# Patient Record
Sex: Female | Born: 1935 | Race: White | Hispanic: No | Smoking: Never smoker
Health system: Southern US, Community
[De-identification: ages and names within clinical notes are randomized; demographics above are authoritative.]

## PROBLEM LIST (undated history)

## (undated) DIAGNOSIS — E7800 Pure hypercholesterolemia, unspecified: Secondary | ICD-10-CM

## (undated) DIAGNOSIS — G459 Transient cerebral ischemic attack, unspecified: Secondary | ICD-10-CM

## (undated) DIAGNOSIS — F419 Anxiety disorder, unspecified: Secondary | ICD-10-CM

## (undated) DIAGNOSIS — D128 Benign neoplasm of rectum: Secondary | ICD-10-CM

## (undated) DIAGNOSIS — I10 Essential (primary) hypertension: Secondary | ICD-10-CM

## (undated) DIAGNOSIS — D125 Benign neoplasm of sigmoid colon: Secondary | ICD-10-CM

## (undated) DIAGNOSIS — D649 Anemia, unspecified: Secondary | ICD-10-CM

## (undated) HISTORY — DX: Benign neoplasm of rectum: D12.8

## (undated) HISTORY — PX: ABDOMINAL HYSTERECTOMY: SHX81

## (undated) HISTORY — DX: Anemia, unspecified: D64.9

## (undated) HISTORY — DX: Essential (primary) hypertension: I10

## (undated) HISTORY — PX: VESICOVAGINAL FISTULA CLOSURE W/ TAH: SUR271

## (undated) HISTORY — PX: TONSILLECTOMY: SUR1361

## (undated) HISTORY — DX: Benign neoplasm of sigmoid colon: D12.5

## (undated) HISTORY — DX: Transient cerebral ischemic attack, unspecified: G45.9

## (undated) HISTORY — DX: Anxiety disorder, unspecified: F41.9

---

## 2013-06-06 ENCOUNTER — Ambulatory Visit (INDEPENDENT_AMBULATORY_CARE_PROVIDER_SITE_OTHER): Payer: Medicare Other | Admitting: Neurology

## 2013-06-06 ENCOUNTER — Encounter: Payer: Self-pay | Admitting: Neurology

## 2013-06-06 VITALS — BP 150/78 | HR 68 | Temp 98.2°F | Resp 20 | Ht 63.0 in | Wt 146.7 lb

## 2013-06-06 DIAGNOSIS — R4182 Altered mental status, unspecified: Secondary | ICD-10-CM

## 2013-06-06 NOTE — Patient Instructions (Addendum)
[facility] [place] main entrance A  Ask for EEG lab  Friday Jan 23 at 1045 @[facility] (Error - No data available.)@

## 2013-06-06 NOTE — Progress Notes (Addendum)
NEUROLOGY CONSULTATION NOTE  Sherry Hess MRN: [id number] DOB: [birth year]  Referring provider: Dr. Lin Landsman Primary care provider: Dr. Lin Landsman  Reason for consult:  TIA  HISTORY OF PRESENT ILLNESS: Sherry Hess is an 80 year old right-handed woman with hypertension and hyperlipidemia who presents for TIA.  [name] accompanied by her son.  Records and images were personally reviewed where available.    On 03/31/13, she went outside to the woods.  She was sitting on the bench and when she stood up, she suddenly felt a sensation of extreme fatigue.  There was no lightheadedness, vertigo, palpitations, diaphoresis, epigastric rising, or strange smell.  Her sons came and found her leaning forward on the bench.  She had a blank stare and was staring straight across.  Her son asked her if she was okay, but she could only mumble "uh huh."  She was asked to smile and was able to minimally smile.  No facial droop was noted.  She did not have any unilateral weakness of the arms or legs.  There was no focal twitching.  Symptoms lasted a few minutes.  As per her son, she didn't remember the incident at first, although she says that she does now.  She did go to the ED.  CT of head did not reveal an acute stroke or bleed.  At the time, she had been following a low-fat, low-cholesterol diet and had been monitoring her blood pressure with systolic BP readings between 120-139.  She did not have any changes to medications at this time.  She was taking ASA 96m which was switched to 3248m  04/23/13 MRI BRAIN WO:  small vessel ischemic changes and atrophy but no acute infarct. 04/23/13 CAROTID DUPLEX:  <50% bilateral ICA stenosis. 04/02/13 LABS:  ESR 1, RPR nonreactive, ANA negative She reportedly had an echocardiogram but I do not have those results.  Apparently it was okay.  ECGs were okay.  She does follow with a cardiologist. She has no history of TIA, stroke or seizure.  She does report a fainting spell  which sounds like a vasovagal reaction.  She was sitting on a barstool eating chips when it got caught in her throat and she passed out.  Medications include:  ASA 8143mFish Oil, carvedilol, Lisinopril, micardis, lovastatin 42m35mertraline.  PAST MEDICAL HISTORY: Hypertension Hypercholesterolemia Anxiety Macular degeneration  PAST SURGICAL HISTORY: Hysterectomy  MEDICATIONS: Lisinopril Doxazosin Omeprazole Micardis ASA 325mg38mLERGIES: Allergies  Allergen Reactions  . Clonidine Derivatives Other (See Comments)    [name]  . Minoxidil     rash  . Norvasc [Amlodipine Besylate]     rash    FAMILY HISTORY: No family history on file.  SOCIAL HISTORY: History   Social History  . Marital Status: Widowed    Spouse Name: N/A    Number of Children: N/A  . Years of Education: N/A   Occupational History  . Not on file.   Social History Main Topics  . Smoking status: Never Smoker   . Smokeless tobacco: Not on file  . Alcohol Use: No  . Drug Use: No  . Sexual Activity: Not on file   Other Topics Concern  . Not on file   Social History Narrative  . No narrative on file    REVIEW OF SYSTEMS: Constitutional: No fevers, chills, or sweats, no generalized fatigue, change in appetite Eyes: No visual changes, double vision, eye pain Ear, nose and throat: No hearing loss, ear pain, nasal congestion,  sore throat Cardiovascular: No chest pain, palpitations Respiratory:  No shortness of breath at rest or with exertion, wheezes GastrointestinaI: No nausea, vomiting, diarrhea, abdominal pain, fecal incontinence Genitourinary:  No dysuria, urinary retention or frequency Musculoskeletal:  No neck pain, back pain Integumentary: No rash, pruritus, skin lesions Neurological: as above Psychiatric: No depression, insomnia, anxiety Endocrine: No palpitations, fatigue, diaphoresis, mood swings, change in appetite, change in weight, increased  thirst Hematologic/Lymphatic:  No anemia, purpura, petechiae. Allergic/Immunologic: no itchy/runny eyes, nasal congestion, recent allergic reactions, rashes  PHYSICAL EXAM: Filed Vitals:   06/06/13 0929  BP: 150/78  Pulse: 68  Temp: 98.2 F (36.8 C)  Resp: 20   General: No acute distress Head:  Normocephalic/atraumatic Neck: supple, no paraspinal tenderness, full range of motion Back: No paraspinal tenderness Heart: regular rate and rhythm Lungs: Clear to auscultation bilaterally. Vascular: No carotid bruits. Neurological Exam: Mental status: alert and oriented to person, place, and time, speech fluent and not dysarthric, language intact. Cranial nerves: CN I: not tested CN II: pupils equal, round and reactive to light, visual fields intact, fundi unremarkable. CN III, IV, VI:  full range of motion, no nystagmus, no ptosis CN V: facial sensation intact CN VII: upper and lower face symmetric CN VIII: hearing intact CN IX, X: gag intact, uvula midline CN XI: sternocleidomastoid and trapezius muscles intact CN XII: tongue midline Bulk & Tone: normal, no fasciculations. Motor: 5/5 throughout Sensation: temperature and vibration intact. Deep Tendon Reflexes: 2+ and symmetric in upper extremities, 3+ and symmetric in lower extremities, no clonus, toes down Finger to nose testing: normal Heel to shin: normal Gait: normal stance and stride.  Able to walk in tandem. Romberg negative.  IMPRESSION: Episode of altered mental status.  Symptoms are vague and unclear.  Certainly could have been TIA.  Should rule out increased seizure tendency as well.  Symptoms do not sound orthostatic.  PLAN: 1.  May switch back to ASA 29m.  Studies have not shown full-strength to be more effective in secondary stroke prevention (only increased bleeding risk).  May consider switching to Plavix although this too is a lateral change with increase risk for bleeding.  Patient wishes to remain on ASA 841m  which I have no objection. 2.  LDL goal should be less than 100.  Check Hgb A1c if not already checked (she says both have been checked by her PCP) 3.  Will check EEG 4.  Follow up in 3 months (sooner if EEG is suspicious for seizure tendency).  Thank you for allowing me to take part in the care of this patient.  AdMetta ClinesDO  CC:  JoLovette ClicheMD

## 2013-06-08 ENCOUNTER — Ambulatory Visit (HOSPITAL_COMMUNITY)
Admission: RE | Admit: 2013-06-08 | Discharge: 2013-06-08 | Disposition: A | Discharge status: Home / Self Care | Payer: Medicare Other | Source: Ambulatory Visit | Attending: Neurology | Admitting: Neurology

## 2013-06-08 DIAGNOSIS — R4182 Altered mental status, unspecified: Secondary | ICD-10-CM

## 2013-06-08 NOTE — Progress Notes (Signed)
EEG completed; results pending.    

## 2013-06-11 ENCOUNTER — Telehealth: Payer: Self-pay | Admitting: Neurology

## 2013-06-11 NOTE — Telephone Encounter (Signed)
Message copied by Annamaria Helling on Mon Jun 11, 2013 10:46 AM ------      Message from: JAFFE, ADAM R      Created: Mon Jun 11, 2013 10:26 AM       Luvenia Starch, please let Ms. Sleeper know that her EEG is normal.  Also, could be fax the report over to her PCP, Dr. Lovette Cliche?      Thanks.      ----- Message -----         From: Dudley Major, DO         Sent: 06/11/2013  10:26 AM           To: Dudley Major, DO                   ------

## 2013-06-11 NOTE — Telephone Encounter (Signed)
Patient made aware EEG normal. Copy of report sent to Dr Lin Landsman.

## 2013-06-11 NOTE — Procedures (Signed)
ELECTROENCEPHALOGRAM REPORT  Date of Study: 06/08/2013  Patient's Name: Sherry Hess MRN: [id number] Date of Birth: 02-21-1931  Indication: Altered mental status   Medications: Lisinopril, doxazosin, omeprazole, Micardis, ASA  Technical Summary: This is a multichannel digital EEG recording, using the international 10-20 placement system.  Spike detection software was employed.  Description: The EEG background is symmetric, with a well-developed posterior dominant rhythm of 8-9 Hz, which is reactive to eye opening and closing.  Diffuse beta activity is seen, with a bilateral frontal preponderance.  No focal or generalized abnormalities are seen.  No focal or generalized epileptiform discharges are seen.  Stage II sleep is not seen.  Hyperventilation and photic stimulation were performed, and produced no abnormalities.  ECG revealed normal cardiac rate and rhythm.  Impression: This is a normal routine EEG of the awake and drowsy states, with activating procedures.  A normal study does not rule out the possibility of a seizure disorder in this patient.  Adam R. Tomi Likens, DO

## 2013-09-10 ENCOUNTER — Encounter: Payer: Self-pay | Admitting: Neurology

## 2013-09-10 ENCOUNTER — Ambulatory Visit (INDEPENDENT_AMBULATORY_CARE_PROVIDER_SITE_OTHER): Payer: Medicare Other | Admitting: Neurology

## 2013-09-10 VITALS — BP 142/76 | HR 70 | Temp 98.0°F | Resp 18 | Ht 63.0 in | Wt 149.1 lb

## 2013-09-10 DIAGNOSIS — R404 Transient alteration of awareness: Secondary | ICD-10-CM

## 2013-09-10 NOTE — Patient Instructions (Signed)
Everything looks fine.  I would continue the baby aspirin 81mg  daily.  Follow up as needed.

## 2013-09-10 NOTE — Progress Notes (Signed)
NEUROLOGY FOLLOW UP OFFICE NOTE  Sherry Hess [id number]  HISTORY OF PRESENT ILLNESS: Sherry Hess is an 80 year old right-handed woman with hypertension and hyperlipidemia who follows up for episode of altered mental status.  [name] accompanied by her son.  Records and images were personally reviewed where available.    UPDATE: 06/11/13 EEG: normal.  No new complaints.  Feeling well.  HISTORY: On 03/31/13, she went outside to the woods.  She was sitting on the bench and when she stood up, she suddenly felt a sensation of extreme fatigue.  There was no lightheadedness, vertigo, palpitations, diaphoresis, epigastric rising, or strange smell.  Her sons came and found her leaning forward on the bench.  She had a blank stare and was staring straight across.  Her son asked her if she was okay, but she could only mumble "uh huh."  She was asked to smile and was able to minimally smile.  No facial droop was noted.  She did not have any unilateral weakness of the arms or legs.  There was no focal twitching.  Symptoms lasted a few minutes.  As per her son, she didn't remember the incident at first, although she says that she does now.  She did go to the ED.  CT of head did not reveal an acute stroke or bleed.  At the time, she had been following a low-fat, low-cholesterol diet and had been monitoring her blood pressure with systolic BP readings between 120-139.  She did not have any changes to medications at this time.  She was taking ASA 39m which was switched to 3217m  04/23/13 MRI BRAIN WO:  small vessel ischemic changes and atrophy but no acute infarct. 04/23/13 CAROTID DUPLEX:  <50% bilateral ICA stenosis. 04/02/13 LABS:  ESR 1, RPR nonreactive, ANA negative She reportedly had an echocardiogram but I do not have those results.  Apparently it was okay.  ECGs were okay.  She does follow with a cardiologist. She has no history of TIA, stroke or seizure.  She does report a fainting spell which  sounds like a vasovagal reaction.  She was sitting on a barstool eating chips when it got caught in her throat and she passed out.  Medications include:  ASA 8153mFish Oil, carvedilol, Lisinopril, micardis, lovastatin 43m5mertraline.  PAST MEDICAL HISTORY: Past Medical History  Diagnosis Date  . Hypertension     MEDICATIONS: Current Outpatient Prescriptions on File Prior to Visit  Medication Sig Dispense Refill  . doxazosin (CARDURA) 8 MG tablet Take 8 mg by mouth daily.      . Fish Oil-Cholecalciferol (FISH OIL + D3) 1000-1000 MG-UNIT CAPS Take by mouth.      . liMarland Kitcheninopril (PRINIVIL,ZESTRIL) 40 MG tablet Take 40 mg by mouth daily.      . loMarland Kitchenastatin (MEVACOR) 40 MG tablet Take 40 mg by mouth 2 (two) times daily.      . Multiple Minerals-Vitamins (CALCIUM & VIT D3 BONE HEALTH PO) Take by mouth.      . omMarland Kitchenprazole (PRILOSEC) 10 MG capsule Take 10 mg by mouth daily.      . pseudoephedrine-acetaminophen (TYLENOL SINUS) 30-500 MG TABS Take 1 tablet by mouth daily.      . teMarland Kitchenmisartan (MICARDIS) 40 MG tablet Take 40 mg by mouth daily.       No current facility-administered medications on file prior to visit.    ALLERGIES: Allergies  Allergen Reactions  . Clonidine Derivatives Other (See Comments)    Patients  feels drunk  . Minoxidil     rash  . Norvasc [Amlodipine Besylate]     rash    FAMILY HISTORY: Family History  Problem Relation Age of Onset  . Ataxia Neg Hx   . Chorea Neg Hx   . Dementia Neg Hx   . Mental retardation Neg Hx   . Migraines Neg Hx   . Multiple sclerosis Neg Hx   . Neurofibromatosis Neg Hx   . Neuropathy Neg Hx   . Parkinsonism Neg Hx   . Seizures Neg Hx   . Cancer Father     lung  . Stroke Mother   . Cancer Brother     x2 bladder    SOCIAL HISTORY: History   Social History  . Marital Status: Widowed    Spouse Name: N/A    Number of Children: N/A  . Years of Education: N/A   Occupational History  . Not on file.   Social History Main  Topics  . Smoking status: Never Smoker   . Smokeless tobacco: Not on file  . Alcohol Use: No  . Drug Use: No  . Sexual Activity: Not on file   Other Topics Concern  . Not on file   Social History Narrative  . No narrative on file    REVIEW OF SYSTEMS: Constitutional: No fevers, chills, or sweats, no generalized fatigue, change in appetite Eyes: No visual changes, double vision, eye pain Ear, nose and throat: No hearing loss, ear pain, nasal congestion, sore throat Cardiovascular: No chest pain, palpitations Respiratory:  No shortness of breath at rest or with exertion, wheezes GastrointestinaI: No nausea, vomiting, diarrhea, abdominal pain, fecal incontinence Genitourinary:  No dysuria, urinary retention or frequency Musculoskeletal:  No neck pain, back pain Integumentary: No rash, pruritus, skin lesions Neurological: as above Psychiatric: No depression, insomnia, anxiety Endocrine: No palpitations, fatigue, diaphoresis, mood swings, change in appetite, change in weight, increased thirst Hematologic/Lymphatic:  No anemia, purpura, petechiae. Allergic/Immunologic: no itchy/runny eyes, nasal congestion, recent allergic reactions, rashes  PHYSICAL EXAM: Filed Vitals:   09/10/13 0857  BP: 142/76  Pulse: 70  Temp: 98 F (36.7 C)  Resp: 18   General: No acute distress Head:  Normocephalic/atraumatic Neck: supple, no paraspinal tenderness, full range of motion Heart:  Regular rate and rhythm Lungs:  Clear to auscultation bilaterally Back: No paraspinal tenderness Neurological Exam: alert and oriented to person, place, and time. Attention span and concentration intact, recent and remote memory intact, fund of knowledge intact.  Speech fluent and not dysarthric, language intact.  CN II-XII intact. Fundoscopic exam unremarkable without vessel changes, exudates, hemorrhages or papilledema.  Bulk and tone normal, muscle strength 5/5 throughout.  Sensation to light touch intact.  Deep  tendon reflexes 2+ throughout.  Finger to nose and heel to shin testing intact.  Gait normal, Romberg negative.  IMPRESSION: Episode of altered mental status.  Possibly TIA but symptoms were vague and non-focal.  Did not seem like seizure.  No repeat spells.  PLAN: 1.  Continue ASA 70m daily 2.  Follow up as needed.  15 minutes spent with patient, over 50% spent counseling and coordinating care.  AMetta Clines DO  CC: JLovette Cliche MD

## 2014-11-20 DIAGNOSIS — I10 Essential (primary) hypertension: Secondary | ICD-10-CM | POA: Insufficient documentation

## 2014-11-20 HISTORY — DX: Essential (primary) hypertension: I10

## 2015-08-15 DIAGNOSIS — J329 Chronic sinusitis, unspecified: Secondary | ICD-10-CM | POA: Diagnosis not present

## 2015-08-15 DIAGNOSIS — Z0000 Encounter for general adult medical examination without abnormal findings: Secondary | ICD-10-CM | POA: Diagnosis not present

## 2015-08-15 DIAGNOSIS — Z1389 Encounter for screening for other disorder: Secondary | ICD-10-CM | POA: Diagnosis not present

## 2015-08-21 DIAGNOSIS — H26493 Other secondary cataract, bilateral: Secondary | ICD-10-CM | POA: Diagnosis not present

## 2015-08-21 DIAGNOSIS — Z961 Presence of intraocular lens: Secondary | ICD-10-CM | POA: Diagnosis not present

## 2015-08-21 DIAGNOSIS — H353112 Nonexudative age-related macular degeneration, right eye, intermediate dry stage: Secondary | ICD-10-CM | POA: Diagnosis not present

## 2015-12-27 DIAGNOSIS — R079 Chest pain, unspecified: Secondary | ICD-10-CM

## 2015-12-27 HISTORY — DX: Chest pain, unspecified: R07.9

## 2015-12-29 DIAGNOSIS — I10 Essential (primary) hypertension: Secondary | ICD-10-CM | POA: Diagnosis not present

## 2015-12-29 DIAGNOSIS — E785 Hyperlipidemia, unspecified: Secondary | ICD-10-CM | POA: Diagnosis not present

## 2015-12-29 DIAGNOSIS — I700 Atherosclerosis of aorta: Secondary | ICD-10-CM | POA: Diagnosis not present

## 2015-12-29 DIAGNOSIS — R079 Chest pain, unspecified: Secondary | ICD-10-CM | POA: Diagnosis not present

## 2016-02-24 DIAGNOSIS — H5202 Hypermetropia, left eye: Secondary | ICD-10-CM | POA: Diagnosis not present

## 2016-02-24 DIAGNOSIS — Z961 Presence of intraocular lens: Secondary | ICD-10-CM | POA: Diagnosis not present

## 2016-02-24 DIAGNOSIS — H353112 Nonexudative age-related macular degeneration, right eye, intermediate dry stage: Secondary | ICD-10-CM | POA: Diagnosis not present

## 2016-02-26 DIAGNOSIS — I10 Essential (primary) hypertension: Secondary | ICD-10-CM | POA: Diagnosis not present

## 2016-03-15 DIAGNOSIS — E559 Vitamin D deficiency, unspecified: Secondary | ICD-10-CM | POA: Diagnosis not present

## 2016-03-15 DIAGNOSIS — G459 Transient cerebral ischemic attack, unspecified: Secondary | ICD-10-CM | POA: Diagnosis not present

## 2016-03-15 DIAGNOSIS — Z79899 Other long term (current) drug therapy: Secondary | ICD-10-CM | POA: Diagnosis not present

## 2016-03-15 DIAGNOSIS — Z9181 History of falling: Secondary | ICD-10-CM | POA: Diagnosis not present

## 2016-03-15 DIAGNOSIS — Z01419 Encounter for gynecological examination (general) (routine) without abnormal findings: Secondary | ICD-10-CM | POA: Diagnosis not present

## 2016-03-15 DIAGNOSIS — D509 Iron deficiency anemia, unspecified: Secondary | ICD-10-CM | POA: Diagnosis not present

## 2016-03-24 DIAGNOSIS — R899 Unspecified abnormal finding in specimens from other organs, systems and tissues: Secondary | ICD-10-CM | POA: Diagnosis not present

## 2016-04-06 DIAGNOSIS — Z1231 Encounter for screening mammogram for malignant neoplasm of breast: Secondary | ICD-10-CM | POA: Diagnosis not present

## 2016-04-15 DIAGNOSIS — E871 Hypo-osmolality and hyponatremia: Secondary | ICD-10-CM | POA: Diagnosis not present

## 2016-04-15 DIAGNOSIS — N183 Chronic kidney disease, stage 3 (moderate): Secondary | ICD-10-CM | POA: Diagnosis not present

## 2016-04-15 DIAGNOSIS — Z8673 Personal history of transient ischemic attack (TIA), and cerebral infarction without residual deficits: Secondary | ICD-10-CM | POA: Diagnosis not present

## 2016-04-15 DIAGNOSIS — I129 Hypertensive chronic kidney disease with stage 1 through stage 4 chronic kidney disease, or unspecified chronic kidney disease: Secondary | ICD-10-CM | POA: Diagnosis not present

## 2016-04-28 DIAGNOSIS — M545 Low back pain: Secondary | ICD-10-CM | POA: Diagnosis not present

## 2016-05-03 DIAGNOSIS — M8580 Other specified disorders of bone density and structure, unspecified site: Secondary | ICD-10-CM | POA: Diagnosis not present

## 2016-05-03 DIAGNOSIS — N200 Calculus of kidney: Secondary | ICD-10-CM | POA: Diagnosis not present

## 2016-05-03 DIAGNOSIS — M545 Low back pain: Secondary | ICD-10-CM | POA: Diagnosis not present

## 2016-05-03 DIAGNOSIS — S22080A Wedge compression fracture of T11-T12 vertebra, initial encounter for closed fracture: Secondary | ICD-10-CM | POA: Diagnosis not present

## 2016-05-13 DIAGNOSIS — M545 Low back pain: Secondary | ICD-10-CM | POA: Diagnosis not present

## 2016-05-21 DIAGNOSIS — M545 Low back pain: Secondary | ICD-10-CM | POA: Diagnosis not present

## 2016-05-24 DIAGNOSIS — M545 Low back pain: Secondary | ICD-10-CM | POA: Diagnosis not present

## 2016-05-26 DIAGNOSIS — M545 Low back pain: Secondary | ICD-10-CM | POA: Diagnosis not present

## 2016-06-10 DIAGNOSIS — E860 Dehydration: Secondary | ICD-10-CM | POA: Diagnosis not present

## 2016-06-10 DIAGNOSIS — G459 Transient cerebral ischemic attack, unspecified: Secondary | ICD-10-CM | POA: Diagnosis not present

## 2016-06-10 DIAGNOSIS — R200 Anesthesia of skin: Secondary | ICD-10-CM | POA: Diagnosis not present

## 2016-06-14 DIAGNOSIS — G459 Transient cerebral ischemic attack, unspecified: Secondary | ICD-10-CM | POA: Diagnosis not present

## 2016-06-14 DIAGNOSIS — I10 Essential (primary) hypertension: Secondary | ICD-10-CM | POA: Diagnosis not present

## 2016-07-15 DIAGNOSIS — G459 Transient cerebral ischemic attack, unspecified: Secondary | ICD-10-CM | POA: Diagnosis not present

## 2016-07-15 DIAGNOSIS — I10 Essential (primary) hypertension: Secondary | ICD-10-CM | POA: Diagnosis not present

## 2016-07-16 DIAGNOSIS — M19071 Primary osteoarthritis, right ankle and foot: Secondary | ICD-10-CM | POA: Diagnosis not present

## 2016-07-16 DIAGNOSIS — Z7689 Persons encountering health services in other specified circumstances: Secondary | ICD-10-CM | POA: Diagnosis not present

## 2016-07-16 DIAGNOSIS — M19072 Primary osteoarthritis, left ankle and foot: Secondary | ICD-10-CM | POA: Diagnosis not present

## 2016-07-16 DIAGNOSIS — I10 Essential (primary) hypertension: Secondary | ICD-10-CM | POA: Diagnosis not present

## 2016-07-16 DIAGNOSIS — Z79899 Other long term (current) drug therapy: Secondary | ICD-10-CM | POA: Diagnosis not present

## 2016-08-17 ENCOUNTER — Ambulatory Visit (INDEPENDENT_AMBULATORY_CARE_PROVIDER_SITE_OTHER): Payer: PPO | Admitting: Neurology

## 2016-08-17 ENCOUNTER — Encounter: Payer: Self-pay | Admitting: Neurology

## 2016-08-17 VITALS — BP 142/76 | HR 57 | Ht 63.0 in | Wt 146.5 lb

## 2016-08-17 DIAGNOSIS — E785 Hyperlipidemia, unspecified: Secondary | ICD-10-CM

## 2016-08-17 DIAGNOSIS — I10 Essential (primary) hypertension: Secondary | ICD-10-CM

## 2016-08-17 DIAGNOSIS — G459 Transient cerebral ischemic attack, unspecified: Secondary | ICD-10-CM | POA: Diagnosis not present

## 2016-08-17 NOTE — Progress Notes (Signed)
NEUROLOGY FOLLOW UP OFFICE NOTE  Sherry Hess [id number]  HISTORY OF PRESENT ILLNESS: Sherry Hess is an 83 year old right-handed woman with hypertension and hyperlipidemia whom I previously saw in 2015 for episode of altered mental status presents today for TIA.  [name] accompanied by her son, who supplements history.    On 06/11/16, she developed sudden onset of numbness in the fingers of her left hand, lasting a couple of minutes and then followed by numbness along the left side of her lower face and jaw-line. In total, symptoms lasted 5 minutes and then resolved.  There was no associated facial droop, weakness, slurred speech, visual disturbance, dizziness, syncope or ataxia.  At the time, her systolic blood pressure was between 160 and 200.  She presented to the ED at [facility].  Notes are not available but she reportedly had a CT of the head which was negative for acute findings, such as stroke.  She followed up with her cardiologist, Dr. Bettina Gavia, who added Plavix to her ASA regimen and hydralazine to her blood pressure regimen of Lisinopril, Coreg and Micardis.  She has since been feeling well with no recurrent events.  PAST MEDICAL HISTORY: Past Medical History:  Diagnosis Date  . Hypertension     MEDICATIONS: Current Outpatient Prescriptions on File Prior to Visit  Medication Sig Dispense Refill  . Fish Oil-Cholecalciferol (FISH OIL + D3) 1000-1000 MG-UNIT CAPS Take by mouth.    . lovastatin (MEVACOR) 40 MG tablet Take 40 mg by mouth 2 (two) times daily.    . Multiple Minerals-Vitamins (CALCIUM & VIT D3 BONE HEALTH PO) Take by mouth.    Marland Kitchen omeprazole (PRILOSEC) 10 MG capsule Take 10 mg by mouth daily.    . pseudoephedrine-acetaminophen (TYLENOL SINUS) 30-500 MG TABS Take 1 tablet by mouth daily.     No current facility-administered medications on file prior to visit.     ALLERGIES: Allergies  Allergen Reactions  . Clonidine Derivatives Other (See Comments)   [name]  . Minoxidil     rash  . Norvasc [Amlodipine Besylate]     rash    FAMILY HISTORY: Family History  Problem Relation Age of Onset  . Ataxia Neg Hx   . Chorea Neg Hx   . Dementia Neg Hx   . Mental retardation Neg Hx   . Migraines Neg Hx   . Multiple sclerosis Neg Hx   . Neurofibromatosis Neg Hx   . Neuropathy Neg Hx   . Parkinsonism Neg Hx   . Seizures Neg Hx   . Cancer Father     lung  . Stroke Mother   . Cancer Brother     x2 bladder    SOCIAL HISTORY: Social History   Social History  . Marital status: Widowed    Spouse name: N/A  . Number of children: N/A  . Years of education: N/A   Occupational History  . Not on file.   Social History Main Topics  . Smoking status: Never Smoker  . Smokeless tobacco: Not on file  . Alcohol use No  . Drug use: No  . Sexual activity: Not on file   Other Topics Concern  . Not on file   Social History Narrative  . No narrative on file    REVIEW OF SYSTEMS: Constitutional: No fevers, chills, or sweats, no generalized fatigue, change in appetite Eyes: No visual changes, double vision, eye pain Ear, nose and throat: No hearing loss, ear pain, nasal congestion,  sore throat Cardiovascular: No chest pain, palpitations Respiratory:  No shortness of breath at rest or with exertion, wheezes GastrointestinaI: No nausea, vomiting, diarrhea, abdominal pain, fecal incontinence Genitourinary:  No dysuria, urinary retention or frequency Musculoskeletal:  No neck pain, back pain Integumentary: No rash, pruritus, skin lesions Neurological: as above Psychiatric: No depression, insomnia, anxiety Endocrine: No palpitations, fatigue, diaphoresis, mood swings, change in appetite, change in weight, increased thirst Hematologic/Lymphatic:  No purpura, petechiae. Allergic/Immunologic: no itchy/runny eyes, nasal congestion, recent allergic reactions, rashes  PHYSICAL EXAM: Vitals:   08/17/16 1502  BP: (!) 142/76    Pulse: (!) 57   General: No acute distress.  Patient appears well-groomed.  normal body habitus. Head:  Normocephalic/atraumatic Eyes:  Fundi examined but not visualized Neck: supple, no paraspinal tenderness, full range of motion Heart:  Regular rate and rhythm Lungs:  Clear to auscultation bilaterally Back: No paraspinal tenderness Neurological Exam: alert and oriented to person, place, and time. Attention span and concentration intact, recent and remote memory intact, fund of knowledge intact.  Speech fluent and not dysarthric, language intact.  CN II-XII intact. Bulk and tone normal, muscle strength 5/5 throughout.  Sensation to light touch, temperature and vibration intact.  Deep tendon reflexes 2+ in upper extremities and 3+ in lower extremities (as noted on prior exam in 2015), toes downgoing.  Finger to nose and heel to shin testing intact.  Gait normal, Romberg negative.  IMPRESSION: Transient ischemic attack Hypertension Hyperlipidemia  PLAN: 1.  Continue dual antiplatelet therapy.  In one month, advised to discontinue ASA and continue Plavix for secondary stroke/TIA prevention. 2.  Continue statin therapy as per PCP.  She has upcoming labs, including lipid panel.  LDL goal should be less than 70. 3.  Check carotid doppler. 4.  Optimize blood pressure control and recheck with PCP or cardiologist 5.  Follow up in 6 months.  Metta Clines, DO  CC:  Ernestene Kiel, MD  Kimberlee Nearing, MD

## 2016-08-17 NOTE — Patient Instructions (Signed)
I think you probably did have a TIA. 1.  Continue aspirin and Plavix for another month.  In one month, stop the aspirin and continue Plavix 75mg  daily for secondary stroke/TIA prevention. 2.  We will also check a carotid doppler to look at the arteries in your neck. 3.  Continue your blood pressure and cholesterol medications.  Have your cholesterol checked by your primary care doctor. 4.  Follow up in 6 months.

## 2016-08-18 ENCOUNTER — Telehealth: Payer: Self-pay

## 2016-08-18 NOTE — Addendum Note (Signed)
Addended by: Milta Deiters on: 08/18/2016 10:25 AM   Modules accepted: Orders

## 2016-08-18 NOTE — Telephone Encounter (Signed)
Called patient to inform scheduled Carotid Doppler @ [facility] as requested:  Appt date: 08/24/16 Arrival time: 2:30 Appt time: 3:00 Enter through [place] on [address].  No answer. Will try later.

## 2016-08-19 NOTE — Telephone Encounter (Signed)
Spoke to son who came w/ patient to [place]. Gave appt info. Son verbalized understanding.

## 2016-08-23 DIAGNOSIS — Z79899 Other long term (current) drug therapy: Secondary | ICD-10-CM | POA: Diagnosis not present

## 2016-08-24 ENCOUNTER — Encounter: Payer: Self-pay | Admitting: Neurology

## 2016-08-24 DIAGNOSIS — G459 Transient cerebral ischemic attack, unspecified: Secondary | ICD-10-CM | POA: Diagnosis not present

## 2016-08-24 DIAGNOSIS — H353112 Nonexudative age-related macular degeneration, right eye, intermediate dry stage: Secondary | ICD-10-CM | POA: Diagnosis not present

## 2016-09-09 ENCOUNTER — Telehealth: Payer: Self-pay

## 2016-09-09 NOTE — Telephone Encounter (Signed)
Clld pt - LMOVM advsng of  Carotid Doppler results - okay.

## 2016-09-09 NOTE — Telephone Encounter (Signed)
-----   Message from Pieter Partridge, DO sent at 09/08/2016  7:08 AM EDT ----- Let patient know that carotid doppler looks okay

## 2016-09-14 DIAGNOSIS — R899 Unspecified abnormal finding in specimens from other organs, systems and tissues: Secondary | ICD-10-CM | POA: Diagnosis not present

## 2016-10-18 DIAGNOSIS — G459 Transient cerebral ischemic attack, unspecified: Secondary | ICD-10-CM | POA: Diagnosis not present

## 2016-10-18 DIAGNOSIS — Z8673 Personal history of transient ischemic attack (TIA), and cerebral infarction without residual deficits: Secondary | ICD-10-CM

## 2016-10-18 DIAGNOSIS — I10 Essential (primary) hypertension: Secondary | ICD-10-CM | POA: Diagnosis not present

## 2016-10-18 HISTORY — DX: Personal history of transient ischemic attack (TIA), and cerebral infarction without residual deficits: Z86.73

## 2016-10-28 DIAGNOSIS — H9190 Unspecified hearing loss, unspecified ear: Secondary | ICD-10-CM | POA: Diagnosis not present

## 2016-10-28 DIAGNOSIS — H9319 Tinnitus, unspecified ear: Secondary | ICD-10-CM | POA: Diagnosis not present

## 2016-10-28 DIAGNOSIS — H9071 Mixed conductive and sensorineural hearing loss, unilateral, right ear, with unrestricted hearing on the contralateral side: Secondary | ICD-10-CM | POA: Diagnosis not present

## 2016-10-28 DIAGNOSIS — Z77122 Contact with and (suspected) exposure to noise: Secondary | ICD-10-CM | POA: Diagnosis not present

## 2016-10-28 DIAGNOSIS — M270 Developmental disorders of jaws: Secondary | ICD-10-CM | POA: Diagnosis not present

## 2016-10-28 DIAGNOSIS — H9042 Sensorineural hearing loss, unilateral, left ear, with unrestricted hearing on the contralateral side: Secondary | ICD-10-CM | POA: Diagnosis not present

## 2016-10-28 DIAGNOSIS — H905 Unspecified sensorineural hearing loss: Secondary | ICD-10-CM | POA: Diagnosis not present

## 2016-10-28 DIAGNOSIS — J342 Deviated nasal septum: Secondary | ICD-10-CM | POA: Diagnosis not present

## 2016-10-28 DIAGNOSIS — H6981 Other specified disorders of Eustachian tube, right ear: Secondary | ICD-10-CM | POA: Diagnosis not present

## 2016-11-16 DIAGNOSIS — E871 Hypo-osmolality and hyponatremia: Secondary | ICD-10-CM | POA: Diagnosis not present

## 2016-11-16 DIAGNOSIS — R55 Syncope and collapse: Secondary | ICD-10-CM | POA: Diagnosis not present

## 2016-11-16 DIAGNOSIS — Z79899 Other long term (current) drug therapy: Secondary | ICD-10-CM | POA: Diagnosis not present

## 2016-11-16 DIAGNOSIS — E860 Dehydration: Secondary | ICD-10-CM | POA: Diagnosis not present

## 2016-11-16 DIAGNOSIS — N179 Acute kidney failure, unspecified: Secondary | ICD-10-CM | POA: Diagnosis not present

## 2016-11-16 DIAGNOSIS — I10 Essential (primary) hypertension: Secondary | ICD-10-CM | POA: Diagnosis not present

## 2016-11-16 DIAGNOSIS — R404 Transient alteration of awareness: Secondary | ICD-10-CM | POA: Diagnosis not present

## 2016-11-16 DIAGNOSIS — E7800 Pure hypercholesterolemia, unspecified: Secondary | ICD-10-CM | POA: Diagnosis not present

## 2016-11-16 DIAGNOSIS — Z8673 Personal history of transient ischemic attack (TIA), and cerebral infarction without residual deficits: Secondary | ICD-10-CM | POA: Diagnosis not present

## 2016-11-17 DIAGNOSIS — E871 Hypo-osmolality and hyponatremia: Secondary | ICD-10-CM | POA: Diagnosis not present

## 2016-11-17 DIAGNOSIS — Z8673 Personal history of transient ischemic attack (TIA), and cerebral infarction without residual deficits: Secondary | ICD-10-CM | POA: Diagnosis not present

## 2016-11-17 DIAGNOSIS — I10 Essential (primary) hypertension: Secondary | ICD-10-CM | POA: Diagnosis not present

## 2016-11-17 DIAGNOSIS — R55 Syncope and collapse: Secondary | ICD-10-CM | POA: Diagnosis not present

## 2016-11-22 DIAGNOSIS — H9071 Mixed conductive and sensorineural hearing loss, unilateral, right ear, with unrestricted hearing on the contralateral side: Secondary | ICD-10-CM | POA: Diagnosis not present

## 2016-11-22 DIAGNOSIS — H9042 Sensorineural hearing loss, unilateral, left ear, with unrestricted hearing on the contralateral side: Secondary | ICD-10-CM | POA: Diagnosis not present

## 2016-11-22 DIAGNOSIS — H905 Unspecified sensorineural hearing loss: Secondary | ICD-10-CM | POA: Diagnosis not present

## 2016-11-22 DIAGNOSIS — H6981 Other specified disorders of Eustachian tube, right ear: Secondary | ICD-10-CM | POA: Diagnosis not present

## 2016-11-24 DIAGNOSIS — R5383 Other fatigue: Secondary | ICD-10-CM | POA: Diagnosis not present

## 2016-11-24 DIAGNOSIS — Z09 Encounter for follow-up examination after completed treatment for conditions other than malignant neoplasm: Secondary | ICD-10-CM | POA: Diagnosis not present

## 2016-11-24 DIAGNOSIS — I10 Essential (primary) hypertension: Secondary | ICD-10-CM | POA: Diagnosis not present

## 2016-11-24 DIAGNOSIS — E871 Hypo-osmolality and hyponatremia: Secondary | ICD-10-CM | POA: Diagnosis not present

## 2016-11-30 DIAGNOSIS — I10 Essential (primary) hypertension: Secondary | ICD-10-CM | POA: Diagnosis not present

## 2016-11-30 DIAGNOSIS — E871 Hypo-osmolality and hyponatremia: Secondary | ICD-10-CM | POA: Diagnosis not present

## 2016-12-09 DIAGNOSIS — I10 Essential (primary) hypertension: Secondary | ICD-10-CM | POA: Diagnosis not present

## 2016-12-09 DIAGNOSIS — D649 Anemia, unspecified: Secondary | ICD-10-CM | POA: Diagnosis not present

## 2016-12-09 DIAGNOSIS — R7989 Other specified abnormal findings of blood chemistry: Secondary | ICD-10-CM | POA: Diagnosis not present

## 2016-12-09 DIAGNOSIS — R55 Syncope and collapse: Secondary | ICD-10-CM | POA: Diagnosis not present

## 2016-12-10 ENCOUNTER — Encounter: Payer: Self-pay | Admitting: Cardiology

## 2016-12-10 ENCOUNTER — Ambulatory Visit (INDEPENDENT_AMBULATORY_CARE_PROVIDER_SITE_OTHER): Payer: PPO | Admitting: Cardiology

## 2016-12-10 VITALS — BP 128/68 | HR 64 | Resp 10 | Ht 63.5 in | Wt 142.0 lb

## 2016-12-10 DIAGNOSIS — R5383 Other fatigue: Secondary | ICD-10-CM | POA: Insufficient documentation

## 2016-12-10 DIAGNOSIS — Z8673 Personal history of transient ischemic attack (TIA), and cerebral infarction without residual deficits: Secondary | ICD-10-CM | POA: Diagnosis not present

## 2016-12-10 DIAGNOSIS — I10 Essential (primary) hypertension: Secondary | ICD-10-CM

## 2016-12-10 DIAGNOSIS — R55 Syncope and collapse: Secondary | ICD-10-CM | POA: Insufficient documentation

## 2016-12-10 HISTORY — DX: Syncope and collapse: R55

## 2016-12-10 HISTORY — DX: Other fatigue: R53.83

## 2016-12-10 MED ORDER — HYDRALAZINE HCL 10 MG PO TABS: 10.0000 mg | ORAL_TABLET | Freq: Three times a day (TID) | ORAL | 11 refills | Status: DC

## 2016-12-10 NOTE — Patient Instructions (Signed)
Medication Instructions:  Your physician has recommended you make the following change in your medication:  CHANGE hydralazine to 10 mg three times daily   Labwork: None  Testing/Procedures: Your physician has recommended that you wear an event monitor. Event monitors are medical devices that record the heart's electrical activity. Doctors most often [place] these monitors to diagnose arrhythmias. Arrhythmias are problems with the speed or rhythm of the heartbeat. The monitor is a small, portable device. You can wear one while you do your normal daily activities. This is usually used to diagnose what is causing palpitations/syncope (passing out).  Your physician has requested that you have an echocardiogram. Echocardiography is a painless test that uses sound waves to create images of your heart. It provides your doctor with information about the size and shape of your heart and how well your heart's chambers and valves are working. This procedure takes approximately one hour. There are no restrictions for this procedure.    Follow-Up: Your physician recommends that you schedule a follow-up appointment in: 6 weeks.   Any Other Special Instructions Will Be Listed Below (If Applicable).     If you need a refill on your cardiac medications before your next appointment, please call your pharmacy.

## 2016-12-10 NOTE — Progress Notes (Signed)
Cardiology Office Note:    Date:  12/10/2016   ID:  [name], DOB 1931-05-01, MRN [id number]  PCP:  Ernestene Kiel, MD  Cardiologist:  [name], MD    Referring MD: Ernestene Kiel, MD   Chief Complaint  Patient presents with  . Loss of Consciousness  I've passed out twice  History of Present Illness:    Sherry Hess is a 83 y.o. female  with hypertension, history of TIA. She's been complaining of having weakness fatigue and tiredness for last few months. Walk on the regular basis with her friend who lives next to her Her however her friend end up having knee surgery and since that time she stop walking. About month ago she was in the store she was standing up and ended up falling down. She completely passed out apparently she was taken to the hospital a lot of tests were done she was told to be dehydrated and sent home. She described similar episode a few days ago when she was standing she starts getting dizzy she was cooking decided to finish cooking however passed out again. There is no palpitations no chest pain tightness squeezing pressure burning chest associated with this sensation. She has been struggling with blood pressure management there is being complication of some medications. She said she started feeling poor from the time that she was put on hydralazine.  Past Medical History:  Diagnosis Date  . Anxiety   . Hypertension   . TIA (transient ischemic attack)     Past Surgical History:  Procedure Laterality Date  . TONSILLECTOMY    . VESICOVAGINAL FISTULA CLOSURE W/ TAH      Current Medications: Current Meds  Medication Sig  . carvedilol (COREG) 12.5 MG tablet Take 12.5 mg by mouth 2 (two) times daily.  . clopidogrel (PLAVIX) 75 MG tablet Take 75 mg by mouth daily.  . Fish Oil-Cholecalciferol (FISH OIL + D3) 1000-1000 MG-UNIT CAPS Take by mouth.  . hydrALAZINE (APRESOLINE) 25 MG tablet Take 25 mg by mouth 2 (two) times daily.  Marland Kitchen lisinopril  (PRINIVIL,ZESTRIL) 40 MG tablet Take 40 mg by mouth daily.  Marland Kitchen lovastatin (MEVACOR) 40 MG tablet Take 40 mg by mouth 2 (two) times daily.  . Multiple Minerals-Vitamins (CALCIUM & VIT D3 BONE HEALTH PO) Take by mouth.  Marland Kitchen omeprazole (PRILOSEC) 20 MG capsule Take 1 capsule by mouth daily.  . pseudoephedrine-acetaminophen (TYLENOL SINUS) 30-500 MG TABS Take 1 tablet by mouth daily.  . sertraline (ZOLOFT) 100 MG tablet Take 100 mg by mouth daily.  Marland Kitchen telmisartan (MICARDIS) 80 MG tablet Take 0.5 tablets by mouth at bedtime.     Allergies:   Clonidine derivatives; Minoxidil; and Norvasc [amlodipine besylate]   Social History   Social History  . Marital status: Widowed    Spouse name: N/A  . Number of children: N/A  . Years of education: N/A   Social History Main Topics  . Smoking status: Never Smoker  . Smokeless tobacco: Never Used  . Alcohol use No  . Drug use: No  . Sexual activity: Not Asked   Other Topics Concern  . None   Social History Narrative  . None     Family History: [name] family history includes Cancer in her brother and father; Stroke in her mother. There is no history of Ataxia, Chorea, Dementia, Mental retardation, Migraines, Multiple sclerosis, Neurofibromatosis, Neuropathy, Parkinsonism, or Seizures. ROS:   Please see the history of present illness.    All 14  point review of systems negative except as described per history of present illness  EKGs/Labs/Other Studies Reviewed:      Recent Labs: No results found for requested labs within last 8760 hours.  Recent Lipid Panel No results found for: CHOL, TRIG, HDL, CHOLHDL, VLDL, LDLCALC, LDLDIRECT  Physical Exam:    VS:  BP 128/68   Pulse 64   Resp 10   Ht 5' 3.5" (1.613 m)   Wt 142 lb (64.4 kg)   BMI 24.76 kg/m     Wt Readings from Last 3 Encounters:  12/10/16 142 lb (64.4 kg)  08/17/16 146 lb 8 oz (66.5 kg)  09/10/13 149 lb 1.6 oz (67.6 kg)     GEN:  Well nourished, well developed in no  acute distress HEENT: Normal NECK: No JVD; No carotid bruits LYMPHATICS: No lymphadenopathy CARDIAC: RRR, no murmurs, no rubs, no gallops RESPIRATORY:  Clear to auscultation without rales, wheezing or rhonchi  ABDOMEN: Soft, non-tender, non-distended MUSCULOSKELETAL:  No edema; No deformity  SKIN: Warm and dry LOWER EXTREMITIES: no swelling NEUROLOGIC:  Alert and oriented x 3 PSYCHIATRIC:  Normal affect   ASSESSMENT:    1. Syncope, unspecified syncope type   2. Syncope, unspecified syncope type   3. Essential hypertension   4. Hx of transient ischemic attack (TIA)   5. Fatigue, unspecified type    PLAN:    In order of problems listed above:  1. Syncope: Concerning, obese the arrhythmia needs to be rule out. Therefore, I will ask respiratory event recorder. Supportive for evaluation I will ask to have echocardiogram done. We'll check orthostatic changes today. 2. Essential hypertension. She does take hydralazine 25 mg twice daily and typically her episodes of syncope happened in the late morning. I will lower the dose of hydralazine and ask her to start taking hydralazine 10 mg 3 times a day to get better and more smooth distribution of the medication for 24 hours. 3. Fatigue: I will call primary care physician to that report for laboratory tests. Echo recording will be done to check the strength of the heart. 4.  Medication Adjustments/Labs and Tests Ordered: Current medicines are reviewed at length with the patient today.  Concerns regarding medicines are outlined above.  Orders Placed This Encounter  Procedures  . ECHOCARDIOGRAM COMPLETE   Medication changes: No orders of the defined types were placed in this encounter.   Signed, Park Liter, MD, [facility] 12/10/2016 3:29 PM    [place]

## 2016-12-13 ENCOUNTER — Telehealth: Payer: Self-pay | Admitting: Cardiology

## 2016-12-13 NOTE — Telephone Encounter (Signed)
Pt called stating she was returning someone's call(didn't see any phone encounters) but also states she is scheduled for an echo this week and she had one at [facility] July 4th and doesn't think she needs another one

## 2016-12-13 NOTE — Telephone Encounter (Signed)
S/w Dr. Agustin Cree. He advised echo could be canceled and performed in 1 year from July. Pt is aware of this but encouraged to keep appointment for monitor.

## 2016-12-23 ENCOUNTER — Ambulatory Visit (INDEPENDENT_AMBULATORY_CARE_PROVIDER_SITE_OTHER): Payer: PPO

## 2016-12-23 ENCOUNTER — Other Ambulatory Visit (HOSPITAL_COMMUNITY): Payer: PPO

## 2016-12-23 DIAGNOSIS — R55 Syncope and collapse: Secondary | ICD-10-CM | POA: Diagnosis not present

## 2016-12-23 DIAGNOSIS — R5383 Other fatigue: Secondary | ICD-10-CM

## 2016-12-23 DIAGNOSIS — Z8673 Personal history of transient ischemic attack (TIA), and cerebral infarction without residual deficits: Secondary | ICD-10-CM

## 2016-12-24 DIAGNOSIS — E871 Hypo-osmolality and hyponatremia: Secondary | ICD-10-CM | POA: Diagnosis not present

## 2016-12-24 DIAGNOSIS — D649 Anemia, unspecified: Secondary | ICD-10-CM | POA: Diagnosis not present

## 2016-12-24 DIAGNOSIS — Z79899 Other long term (current) drug therapy: Secondary | ICD-10-CM | POA: Diagnosis not present

## 2017-01-01 DIAGNOSIS — D649 Anemia, unspecified: Secondary | ICD-10-CM | POA: Diagnosis not present

## 2017-01-03 DIAGNOSIS — E871 Hypo-osmolality and hyponatremia: Secondary | ICD-10-CM | POA: Diagnosis not present

## 2017-01-18 DIAGNOSIS — R195 Other fecal abnormalities: Secondary | ICD-10-CM | POA: Diagnosis not present

## 2017-01-18 DIAGNOSIS — E871 Hypo-osmolality and hyponatremia: Secondary | ICD-10-CM | POA: Diagnosis not present

## 2017-01-18 DIAGNOSIS — I10 Essential (primary) hypertension: Secondary | ICD-10-CM | POA: Diagnosis not present

## 2017-01-26 ENCOUNTER — Ambulatory Visit (INDEPENDENT_AMBULATORY_CARE_PROVIDER_SITE_OTHER): Payer: PPO | Admitting: Cardiology

## 2017-01-26 ENCOUNTER — Encounter: Payer: Self-pay | Admitting: Cardiology

## 2017-01-26 VITALS — BP 140/80 | HR 68 | Resp 10 | Ht 63.5 in | Wt 141.8 lb

## 2017-01-26 DIAGNOSIS — Z8673 Personal history of transient ischemic attack (TIA), and cerebral infarction without residual deficits: Secondary | ICD-10-CM

## 2017-01-26 DIAGNOSIS — R55 Syncope and collapse: Secondary | ICD-10-CM

## 2017-01-26 DIAGNOSIS — I480 Paroxysmal atrial fibrillation: Secondary | ICD-10-CM | POA: Diagnosis not present

## 2017-01-26 DIAGNOSIS — R5383 Other fatigue: Secondary | ICD-10-CM | POA: Diagnosis not present

## 2017-01-26 DIAGNOSIS — I10 Essential (primary) hypertension: Secondary | ICD-10-CM | POA: Diagnosis not present

## 2017-01-26 DIAGNOSIS — R079 Chest pain, unspecified: Secondary | ICD-10-CM

## 2017-01-26 NOTE — Patient Instructions (Signed)
Medication Instructions:  Your physician recommends that you continue on your current medications as directed. Please refer to the Current Medication list given to you today. We need to start you on anticoagulation for your A-fib, we are awaiting the results of your stool sample from your primary care doctor. (Please let us know when you get the results)  Labwork: Your physician recommends that you have lab work today to check your blood cell count and clotting factor.   Testing/Procedures: None   Follow-Up: Your physician recommends that you schedule a follow-up appointment in: 1 month   Any Other Special Instructions Will Be Listed Below (If Applicable).  Please note that any paperwork needing to be filled out by the provider will need to be addressed at the front desk prior to seeing the provider. Please note that any paperwork FMLA, Disability or other documents regarding health condition is subject to a $25.00 charge that must be received prior to completion of paperwork in the form of a money order or check.    Atrial Fibrillation Atrial fibrillation is a type of heartbeat that is irregular or fast (rapid). If you have this condition, your heart keeps quivering in a weird (chaotic) way. This condition can make it so your heart cannot pump blood normally. Having this condition gives a person more risk for stroke, heart failure, and other heart problems. There are different types of atrial fibrillation. Talk with your doctor to learn about the type that you have. Follow these instructions at home:  Take over-the-counter and prescription medicines only as told by your doctor.  If your doctor prescribed a blood-thinning medicine, take it exactly as told. Taking too much of it can cause bleeding. If you do not take enough of it, you will not have the protection that you need against stroke and other problems.  Do not use any tobacco products. These include cigarettes, chewing tobacco, and  e-cigarettes. If you need help quitting, ask your doctor.  If you have apnea (obstructive sleep apnea), manage it as told by your doctor.  Do not drink alcohol.  Do not drink beverages that have caffeine. These include coffee, soda, and tea.  Maintain a healthy weight. Do not use diet pills unless your doctor says they are safe for you. Diet pills may make heart problems worse.  Follow diet instructions as told by your doctor.  Exercise regularly as told by your doctor.  Keep all follow-up visits as told by your doctor. This is important. Contact a doctor if:  You notice a change in the speed, rhythm, or strength of your heartbeat.  You are taking a blood-thinning medicine and you notice more bruising.  You get tired more easily when you move or exercise. Get help right away if:  You have pain in your chest or your belly (abdomen).  You have sweating or weakness.  You feel sick to your stomach (nauseous).  You notice blood in your throw up (vomit), poop (stool), or pee (urine).  You are short of breath.  You suddenly have swollen feet and ankles.  You feel dizzy.  Your suddenly get weak or numb in your face, arms, or legs, especially if it happens on one side of your body.  You have trouble talking, trouble understanding, or both.  Your face or your eyelid droops on one side. These symptoms may be an emergency. Do not wait to see if the symptoms will go away. Get medical help right away. Call your local emergency services (911  in the [place] Do not drive yourself to the hospital. This information is not intended to replace advice given to you by your health care provider. Make sure you discuss any questions you have with your health care provider. Document Released: 02/10/2008 Document Revised: 10/09/2015 Document Reviewed: 08/28/2014 Elsevier Interactive Patient Education  [employer].    If you need a refill on your cardiac medications before your next  appointment, please call your pharmacy.

## 2017-01-26 NOTE — Addendum Note (Signed)
Addended by: Kathyrn Sheriff on: 01/26/2017 02:24 PM   Modules accepted: Orders

## 2017-01-26 NOTE — Progress Notes (Signed)
Cardiology Office Note:    Date:  01/26/2017   ID:  Sherry Hess, DOB 1930/09/30, MRN [id number]  PCP:  Ernestene Kiel, MD  Cardiologist:  [name], MD    Referring MD: Ernestene Kiel, MD   Chief Complaint  Patient presents with  . Follow up Holter  Sherry Hess is doing much better  History of Present Illness:    Sherry Hess is a 83 y.o. female  with episodes of syncope. Initial thinking was that this was related to hydralazine which I cut down the dose since that time Sherry Hess is doing well. Denies having any dizziness or passing out. He is wearing event recorder trying to find out why Sherry Hess had episode syncope but surprisingly we discovered that Sherry Hess get periods of atrial fibrillation and atrial flutter in the purpose of today's visit distal talk about those issues. We spent cradle of time talking about that. Sherry Hess CHADS2Vas score equals 5. I told Sherry Hess Sherry Hess needs to be anticoagulated but the same time Sherry Hess was fine to be anemic. The anemia workup is still pending. I will recheck Sherry Hess CBC today and Sherry Hess will be referred to gastroenterologist since Sherry Hess did have one sample of stool positive for guaiac.  Past Medical History:  Diagnosis Date  . Anxiety   . Hypertension   . TIA (transient ischemic attack)     Past Surgical History:  Procedure Laterality Date  . TONSILLECTOMY    . VESICOVAGINAL FISTULA CLOSURE W/ TAH      Current Medications: Current Meds  Medication Sig  . carvedilol (COREG) 12.5 MG tablet Take 12.5 mg by mouth 2 (two) times daily.  . clopidogrel (PLAVIX) 75 MG tablet Take 75 mg by mouth daily.  . Fish Oil-Cholecalciferol (FISH OIL + D3) 1000-1000 MG-UNIT CAPS Take by mouth.  . hydrALAZINE (APRESOLINE) 10 MG tablet Take 1 tablet (10 mg total) by mouth 3 (three) times daily.  Marland Kitchen lisinopril (PRINIVIL,ZESTRIL) 40 MG tablet Take 40 mg by mouth daily.  Marland Kitchen lovastatin (MEVACOR) 40 MG tablet Take 40 mg by mouth 2 (two) times daily.  . Multiple Minerals-Vitamins (CALCIUM &  VIT D3 BONE HEALTH PO) Take by mouth.  Marland Kitchen omeprazole (PRILOSEC) 20 MG capsule Take 1 capsule by mouth daily.  . pseudoephedrine-acetaminophen (TYLENOL SINUS) 30-500 MG TABS Take 1 tablet by mouth daily.  Marland Kitchen telmisartan (MICARDIS) 80 MG tablet Take 0.5 tablets by mouth at bedtime.     Allergies:   Clonidine derivatives; Minoxidil; and Norvasc [amlodipine besylate]   Social History   Social History  . Marital status: Widowed    Spouse name: N/A  . Number of children: N/A  . Years of education: N/A   Social History Main Topics  . Smoking status: Never Smoker  . Smokeless tobacco: Never Used  . Alcohol use No  . Drug use: No  . Sexual activity: Not Asked   Other Topics Concern  . None   Social History Narrative  . None     Family History: [name] family history includes Cancer in Sherry Hess brother and father; Stroke in Sherry Hess mother. There is no history of Ataxia, Chorea, Dementia, Mental retardation, Migraines, Multiple sclerosis, Neurofibromatosis, Neuropathy, Parkinsonism, or Seizures. ROS:   Please see the history of present illness.    All 14 point review of systems negative except as described per history of present illness  EKGs/Labs/Other Studies Reviewed:      Recent Labs: No results found for requested labs within last 8760 hours.  Recent Lipid Panel No  results found for: CHOL, TRIG, HDL, CHOLHDL, VLDL, LDLCALC, LDLDIRECT  Physical Exam:    VS:  BP 140/80   Pulse 68   Resp 10   Ht 5' 3.5" (1.613 m)   Wt 141 lb 12.8 oz (64.3 kg)   BMI 24.72 kg/m     Wt Readings from Last 3 Encounters:  01/26/17 141 lb 12.8 oz (64.3 kg)  12/10/16 142 lb (64.4 kg)  08/17/16 146 lb 8 oz (66.5 kg)     GEN:  Well nourished, well developed in no acute distress HEENT: Normal NECK: No JVD; No carotid bruits LYMPHATICS: No lymphadenopathy CARDIAC: RRR, no murmurs, no rubs, no gallops RESPIRATORY:  Clear to auscultation without rales, wheezing or rhonchi  ABDOMEN: Soft,  non-tender, non-distended MUSCULOSKELETAL:  No edema; No deformity  SKIN: Warm and dry LOWER EXTREMITIES: no swelling NEUROLOGIC:  Alert and oriented x 3 PSYCHIATRIC:  Normal affect   ASSESSMENT:    1. Essential hypertension   2. Syncope, unspecified syncope type   3. Chest pain in adult   4. Fatigue, unspecified type   5. Hx of transient ischemic attack (TIA)   6. Paroxysmal atrial fibrillation (HCC)    PLAN:    In order of problems listed above:  1. Essential hypertension: Blood pressure is fairly controlled which I will continue present management. 2. Syncope: Most likely related to orthostatic changes secondary to excess of hydralazine which I cut down already since that time Sherry Hess is asymptomatic. 3. Chest pain: Denies having any. 4. History of TIA: Now with paroxysmal atrial fibrillation that probably explains the reason for TIA and we need to anticoagulate Sherry Hess. 5. Paroxysmal atrial fibrillation: I will do a CBC again I will get results of Sherry Hess stool guaiac most likely requires referral to [facility] controlled. Sherry Hess tells been that 17 years ago Sherry Hess had colonoscopy done which did not show any important findings.   Medication Adjustments/Labs and Tests Ordered: Current medicines are reviewed at length with the patient today.  Concerns regarding medicines are outlined above.  No orders of the defined types were placed in this encounter.  Medication changes: No orders of the defined types were placed in this encounter.   Signed, Park Liter, MD, [facility] 01/26/2017 2:14 PM    [facility]

## 2017-01-27 LAB — CBC WITH DIFFERENTIAL/PLATELET
BASOS ABS: 0 10*3/uL (ref 0.0–0.2)
Basos: 0 %
EOS (ABSOLUTE): 0.1 10*3/uL (ref 0.0–0.4)
Eos: 2 %
Hematocrit: 32.4 % — ABNORMAL LOW (ref 34.0–46.6)
Hemoglobin: 10.3 g/dL — ABNORMAL LOW (ref 11.1–15.9)
IMMATURE GRANS (ABS): 0 10*3/uL (ref 0.0–0.1)
Immature Granulocytes: 1 %
LYMPHS: 26 %
Lymphocytes Absolute: 1.5 10*3/uL (ref 0.7–3.1)
MCH: 26.1 pg — AB (ref 26.6–33.0)
MCHC: 31.8 g/dL (ref 31.5–35.7)
MCV: 82 fL (ref 79–97)
Monocytes Absolute: 0.9 10*3/uL (ref 0.1–0.9)
Monocytes: 16 %
NEUTROS PCT: 55 %
Neutrophils Absolute: 3.2 10*3/uL (ref 1.4–7.0)
PLATELETS: 285 10*3/uL (ref 150–379)
RBC: 3.95 x10E6/uL (ref 3.77–5.28)
RDW: 14.8 % (ref 12.3–15.4)
WBC: 5.7 10*3/uL (ref 3.4–10.8)

## 2017-01-27 LAB — PROTIME-INR
INR: 1.1 (ref 0.8–1.2)
Prothrombin Time: 11 s (ref 9.1–12.0)

## 2017-01-31 ENCOUNTER — Telehealth: Payer: Self-pay

## 2017-01-31 NOTE — Telephone Encounter (Signed)
-----   Message from Kathyrn Sheriff, RN sent at 01/26/2017  2:18 PM EDT ----- Regarding: blood thinner Call PCP and get results of stool; needs to be anticoagd

## 2017-01-31 NOTE — Telephone Encounter (Signed)
S/w pt she states she was advised her results were abnormal and showed blood in stool. She states her PCP is referring her to a GI doctor. I have encouraged pt to call me if she does not hear anything about an appointment so that I can arrange a GI consult. Dr. Agustin Cree is aware of this. We need to start anticoagulation asap however until the source of bleeding is determined we will hold off for now per Dr. Agustin Cree.

## 2017-02-03 ENCOUNTER — Telehealth: Payer: Self-pay | Admitting: Cardiology

## 2017-02-03 NOTE — Telephone Encounter (Signed)
S/w pt and she states that her GI appointment will not be until Nov. 2. I have advised pt that I will call Dr. Leland Her office to see if we can arrange a sooner appointment d/t her high risk for stroke at this time and the need to determine source of bleeding.

## 2017-02-03 NOTE — Telephone Encounter (Signed)
Patient is calling about some clarification of directions for her future appointments.. Please call her.

## 2017-02-03 NOTE — Telephone Encounter (Signed)
Pt is aware that her appointment for GI has been changed to tomorrow at 3:30 with 3:15 arrival time!

## 2017-02-04 DIAGNOSIS — R195 Other fecal abnormalities: Secondary | ICD-10-CM | POA: Diagnosis not present

## 2017-02-04 DIAGNOSIS — D500 Iron deficiency anemia secondary to blood loss (chronic): Secondary | ICD-10-CM | POA: Diagnosis not present

## 2017-02-07 ENCOUNTER — Telehealth: Payer: Self-pay | Admitting: Cardiology

## 2017-02-07 NOTE — Telephone Encounter (Signed)
Patient is scheduled for colonscopy for 10/22 with Dr. Lyndel Safe.. She wanted to let you know. Please call her if you need too.Marland Kitchen

## 2017-02-08 ENCOUNTER — Other Ambulatory Visit: Payer: Self-pay | Admitting: Cardiology

## 2017-02-08 DIAGNOSIS — L300 Nummular dermatitis: Secondary | ICD-10-CM | POA: Diagnosis not present

## 2017-02-08 DIAGNOSIS — L578 Other skin changes due to chronic exposure to nonionizing radiation: Secondary | ICD-10-CM | POA: Diagnosis not present

## 2017-02-17 ENCOUNTER — Encounter: Payer: Self-pay | Admitting: Neurology

## 2017-02-17 ENCOUNTER — Ambulatory Visit (INDEPENDENT_AMBULATORY_CARE_PROVIDER_SITE_OTHER): Payer: PPO | Admitting: Neurology

## 2017-02-17 VITALS — BP 150/104 | HR 74 | Ht 63.5 in | Wt 137.8 lb

## 2017-02-17 DIAGNOSIS — E785 Hyperlipidemia, unspecified: Secondary | ICD-10-CM

## 2017-02-17 DIAGNOSIS — G459 Transient cerebral ischemic attack, unspecified: Secondary | ICD-10-CM

## 2017-02-17 DIAGNOSIS — I480 Paroxysmal atrial fibrillation: Secondary | ICD-10-CM | POA: Diagnosis not present

## 2017-02-17 DIAGNOSIS — I10 Essential (primary) hypertension: Secondary | ICD-10-CM

## 2017-02-17 NOTE — Progress Notes (Signed)
NEUROLOGY FOLLOW UP OFFICE NOTE  Sherry Hess [id number]  HISTORY OF PRESENT ILLNESS: Sherry Hess is an 83 year old right-handed woman with hypertension and hyperlipidemia who follows up for TIA.  [name] accompanied by her son who supplements history.  UPDATE: [name] taking Plavix for secondary stroke prevention and statin therapy LDL from 08/23/16 was 110.  Carotid doppler from 08/24/16 demonstrated no hemodynamically significant ICA stenosis.  TTE from 11/17/16 demonstrated LV EF 65-70% with no ASD.  She had a heart monitor last month which recorded paroxysmal atrial fibrillation.  There has been discussion about changing to an anticoagulant.  However, she was found to have blood in her stool and has an upcoming colonoscopy.   HISTORY: On 06/11/16, she developed sudden onset of numbness in the fingers of her left hand, lasting a couple of minutes and then followed by numbness along the left side of her lower face and jaw-line. In total, symptoms lasted 5 minutes and then resolved.  There was no associated facial droop, weakness, slurred speech, visual disturbance, dizziness, syncope or ataxia.  At the time, her systolic blood pressure was between 160 and 200.  She presented to the ED at [facility].  Notes are not available but she reportedly had a CT of the head which was negative for acute findings, such as stroke.  She followed up with her cardiologist, Dr. Bettina Gavia, who added Plavix to her ASA regimen and hydralazine to her blood pressure regimen of Lisinopril, Coreg and Micardis.    PAST MEDICAL HISTORY: Past Medical History:  Diagnosis Date  . Anxiety   . Hypertension   . TIA (transient ischemic attack)     MEDICATIONS: Current Outpatient Prescriptions on File Prior to Visit  Medication Sig Dispense Refill  . carvedilol (COREG) 12.5 MG tablet Take 12.5 mg by mouth 2 (two) times daily.    . clopidogrel (PLAVIX) 75 MG tablet TAKE 1 TABLET BY MOUTH DAILY 30 tablet 6  .  Fish Oil-Cholecalciferol (FISH OIL + D3) 1000-1000 MG-UNIT CAPS Take by mouth.    . hydrALAZINE (APRESOLINE) 10 MG tablet Take 1 tablet (10 mg total) by mouth 3 (three) times daily. 90 tablet 11  . lisinopril (PRINIVIL,ZESTRIL) 40 MG tablet Take 40 mg by mouth daily.    Marland Kitchen lovastatin (MEVACOR) 40 MG tablet Take 40 mg by mouth 2 (two) times daily.    . Multiple Minerals-Vitamins (CALCIUM & VIT D3 BONE HEALTH PO) Take by mouth.    Marland Kitchen omeprazole (PRILOSEC) 20 MG capsule Take 1 capsule by mouth daily.    . pseudoephedrine-acetaminophen (TYLENOL SINUS) 30-500 MG TABS Take 1 tablet by mouth daily.    Marland Kitchen telmisartan (MICARDIS) 80 MG tablet Take 0.5 tablets by mouth at bedtime.     No current facility-administered medications on file prior to visit.     ALLERGIES: Allergies  Allergen Reactions  . Adhesive [Tape]   . Clonidine Derivatives Other (See Comments)    [name]  . Minoxidil     rash  . Norvasc [Amlodipine Besylate]     rash    FAMILY HISTORY: Family History  Problem Relation Age of Onset  . Cancer Father        lung  . Stroke Mother   . Cancer Brother        x2 bladder  . Ataxia Neg Hx   . Chorea Neg Hx   . Dementia Neg Hx   . Mental retardation Neg Hx   . Migraines Neg  Hx   . Multiple sclerosis Neg Hx   . Neurofibromatosis Neg Hx   . Neuropathy Neg Hx   . Parkinsonism Neg Hx   . Seizures Neg Hx     SOCIAL HISTORY: Social History   Social History  . Marital status: Widowed    Spouse name: N/A  . Number of children: N/A  . Years of education: N/A   Occupational History  . Not on file.   Social History Main Topics  . Smoking status: Never Smoker  . Smokeless tobacco: Never Used  . Alcohol use No  . Drug use: No  . Sexual activity: Not on file   Other Topics Concern  . Not on file   Social History Narrative  . No narrative on file    REVIEW OF SYSTEMS: Constitutional: No fevers, chills, or sweats, no generalized fatigue, change in  appetite Eyes: No visual changes, double vision, eye pain Ear, nose and throat: No hearing loss, ear pain, nasal congestion, sore throat Cardiovascular: No chest pain, palpitations Respiratory:  No shortness of breath at rest or with exertion, wheezes GastrointestinaI: No nausea, vomiting, diarrhea, abdominal pain, fecal incontinence Genitourinary:  No dysuria, urinary retention or frequency Musculoskeletal:  No neck pain, back pain Integumentary: No rash, pruritus, skin lesions Neurological: as above Psychiatric: No depression, insomnia, anxiety Endocrine: No palpitations, fatigue, diaphoresis, mood swings, change in appetite, change in weight, increased thirst Hematologic/Lymphatic:  No purpura, petechiae. Allergic/Immunologic: no itchy/runny eyes, nasal congestion, recent allergic reactions, rashes  PHYSICAL EXAM: Vitals:   02/17/17 1418  BP: (!) 150/104  Pulse: 74  SpO2: 98%   General: No acute distress.  Patient appears well-groomed.  normal body habitus. Head:  Normocephalic/atraumatic Eyes:  Fundi examined but not visualized Neck: supple, no paraspinal tenderness, full range of motion Heart:  Regular rate and rhythm Lungs:  Clear to auscultation bilaterally Back: No paraspinal tenderness Neurological Exam: alert and oriented to person, place, and time. Attention span and concentration intact, recent and remote memory intact, fund of knowledge intact.  Speech fluent and not dysarthric, language intact.  CN II-XII intact. Bulk and tone normal, muscle strength 5/5 throughout.  Sensation to light touch  intact.  Deep tendon reflexes 2+ throughout.  Finger to nose testing intact.  Gait normal  IMPRESSION: Transient ischemic attack Paroxysmal atrial fibrillation Hypertension Hyperlipidemia  RECOMMENDATIONS: 1.  Continue Plavix for now and switch to anticoagulation when cleared. 2.  Follow up with cardiology for blood pressure recheck/optimization 3.  Consider changing statin  (to possibly Lipitor or Crestor) to obtain LDL goal of less than 70) 4.  Follow up in 6 months.  19 minutes spent face to face with patient, over 50% spent discussing management.  Metta Clines, DO  CC:  Ernestene Kiel, MD  [name], MD

## 2017-02-17 NOTE — Patient Instructions (Signed)
Continue the Plavix for now. Follow up with your cardiologist regarding blood pressure Continue the lovastatin for now but check with your cardiologist about possibly changing to another cholesterol medication in order to lower the LDL more. Follow up in 6 months.

## 2017-02-18 DIAGNOSIS — E785 Hyperlipidemia, unspecified: Secondary | ICD-10-CM | POA: Diagnosis not present

## 2017-02-18 DIAGNOSIS — E871 Hypo-osmolality and hyponatremia: Secondary | ICD-10-CM | POA: Diagnosis not present

## 2017-02-18 DIAGNOSIS — I10 Essential (primary) hypertension: Secondary | ICD-10-CM | POA: Diagnosis not present

## 2017-02-18 DIAGNOSIS — D649 Anemia, unspecified: Secondary | ICD-10-CM | POA: Diagnosis not present

## 2017-02-21 ENCOUNTER — Other Ambulatory Visit: Payer: Self-pay

## 2017-02-21 MED ORDER — HYDRALAZINE HCL 25 MG PO TABS: 12.5000 mg | ORAL_TABLET | Freq: Three times a day (TID) | ORAL | 0 refills | Status: DC

## 2017-02-21 NOTE — Telephone Encounter (Signed)
Patient's pharmacy called to notify the office that Hydralazine 10 mg was on back order and to see what it should be change to. Dr. Agustin Cree has had me to send Hydralazine 25 mg and have the patient to take 1/2 tablet three times daily.

## 2017-02-23 ENCOUNTER — Encounter: Payer: Self-pay | Admitting: Cardiology

## 2017-02-23 ENCOUNTER — Ambulatory Visit (INDEPENDENT_AMBULATORY_CARE_PROVIDER_SITE_OTHER): Payer: PPO | Admitting: Cardiology

## 2017-02-23 VITALS — BP 122/78 | HR 56 | Resp 10 | Ht 63.5 in | Wt 136.8 lb

## 2017-02-23 DIAGNOSIS — Z8673 Personal history of transient ischemic attack (TIA), and cerebral infarction without residual deficits: Secondary | ICD-10-CM | POA: Diagnosis not present

## 2017-02-23 DIAGNOSIS — I10 Essential (primary) hypertension: Secondary | ICD-10-CM | POA: Diagnosis not present

## 2017-02-23 DIAGNOSIS — I480 Paroxysmal atrial fibrillation: Secondary | ICD-10-CM

## 2017-02-23 DIAGNOSIS — R5383 Other fatigue: Secondary | ICD-10-CM | POA: Diagnosis not present

## 2017-02-23 HISTORY — DX: Paroxysmal atrial fibrillation: I48.0

## 2017-02-23 NOTE — Patient Instructions (Signed)
Medication Instructions:  Your physician recommends that you continue on your current medications as directed. Please refer to the Current Medication list given to you today.  Labwork: None  Testing/Procedures: None  Follow-Up: Your physician recommends that you schedule a follow-up appointment in: 1 month   Any Other Special Instructions Will Be Listed Below (If Applicable).  Please note that any paperwork needing to be filled out by the provider will need to be addressed at the front desk prior to seeing the provider. Please note that any paperwork FMLA, Disability or other documents regarding health condition is subject to a $25.00 charge that must be received prior to completion of paperwork in the form of a money order or check.    If you need a refill on your cardiac medications before your next appointment, please call your pharmacy.

## 2017-02-23 NOTE — Progress Notes (Signed)
Cardiology Office Note:    Date:  02/23/2017   ID:  Sherry Hess, DOB 1931/03/22, MRN [id number]  PCP:  Sherry Kiel, MD  Cardiologist:  Sherry Campus, MD    Referring MD: Sherry Kiel, MD   Chief Complaint  Patient presents with  . 1 month follow up  I'm weak and tired  History of Present Illness:    Sherry Hess is a 83 y.o. female  with paroxysmal atrial fibrillation. We were getting ready to put her on anticoagulation however she is anemic and her laboratory test showed presence of blood in her stool. She has seen gastroenterologist and she is scheduled to have colonoscopy. Denies having any chest pain tightness squeezing pressure burning in her chest. Complaining of being weak and tired and exhausted.  Past Medical History:  Diagnosis Date  . Anxiety   . Hypertension   . TIA (transient ischemic attack)     Past Surgical History:  Procedure Laterality Date  . TONSILLECTOMY    . VESICOVAGINAL FISTULA CLOSURE W/ TAH      Current Medications: Current Meds  Medication Sig  . atorvastatin (LIPITOR) 20 MG tablet Take 20 mg by mouth daily.  . busPIRone (BUSPAR) 15 MG tablet Take 15 mg by mouth 2 (two) times daily.  . carvedilol (COREG) 12.5 MG tablet Take 12.5 mg by mouth 2 (two) times daily.  . clopidogrel (PLAVIX) 75 MG tablet TAKE 1 TABLET BY MOUTH DAILY  . Fish Oil-Cholecalciferol (FISH OIL + D3) 1000-1000 MG-UNIT CAPS Take by mouth.  . hydrALAZINE (APRESOLINE) 25 MG tablet Take 0.5 tablets (12.5 mg total) by mouth 3 (three) times daily.  Marland Kitchen lisinopril (PRINIVIL,ZESTRIL) 40 MG tablet Take 40 mg by mouth daily.  . Multiple Minerals-Vitamins (CALCIUM & VIT D3 BONE HEALTH PO) Take by mouth.  Marland Kitchen omeprazole (PRILOSEC) 20 MG capsule Take 1 capsule by mouth daily.  Marland Kitchen telmisartan (MICARDIS) 80 MG tablet Take 0.5 tablets by mouth at bedtime.     Allergies:   Adhesive [tape]; Clonidine derivatives; Minoxidil; and Norvasc [amlodipine besylate]    Social History   Social History  . Marital status: Widowed    Spouse name: N/A  . Number of children: N/A  . Years of education: N/A   Social History Main Topics  . Smoking status: Never Smoker  . Smokeless tobacco: Never Used  . Alcohol use No  . Drug use: No  . Sexual activity: Not Asked   Other Topics Concern  . None   Social History Narrative  . None     Family History: [name] family history includes Cancer in her brother and father; Stroke in her mother. There is no history of Ataxia, Chorea, Dementia, Mental retardation, Migraines, Multiple sclerosis, Neurofibromatosis, Neuropathy, Parkinsonism, or Seizures. ROS:   Please see the history of present illness.    All 14 point review of systems negative except as described per history of present illness  EKGs/Labs/Other Studies Reviewed:      Recent Labs: 01/26/2017: Hemoglobin 10.3; Platelets 285  Recent Lipid Panel No results found for: CHOL, TRIG, HDL, CHOLHDL, VLDL, LDLCALC, LDLDIRECT  Physical Exam:    VS:  BP 122/78   Pulse (!) 56   Resp 10   Ht 5' 3.5" (1.613 m)   Wt 136 lb 12.8 oz (62.1 kg)   BMI 23.85 kg/m     Wt Readings from Last 3 Encounters:  02/23/17 136 lb 12.8 oz (62.1 kg)  02/17/17 137 lb 12.8 oz (62.5 kg)  01/26/17  141 lb 12.8 oz (64.3 kg)     GEN:  Well nourished, well developed in no acute distress HEENT: Normal NECK: No JVD; No carotid bruits LYMPHATICS: No lymphadenopathy CARDIAC: RRR, no murmurs, no rubs, no gallops RESPIRATORY:  Clear to auscultation without rales, wheezing or rhonchi  ABDOMEN: Soft, non-tender, non-distended MUSCULOSKELETAL:  No edema; No deformity  SKIN: Warm and dry LOWER EXTREMITIES: no swelling NEUROLOGIC:  Alert and oriented x 3 PSYCHIATRIC:  Normal affect   ASSESSMENT:    1. Essential hypertension   2. Paroxysmal atrial fibrillation (HCC)   3. Fatigue, unspecified type   4. Hx of transient ischemic attack (TIA)    PLAN:    In order  of problems listed above:  1. Essential hypertension: Blood presents to well controlled today however she showed me her measurements from home on those of very high. I asked her to recheck blood pressure at home and bring results to me as well as her blood pressure monitor so we can check accuracy of this device. 2. Paroxysmal atrial fibrillation: Denies having palpitations awaiting GI evaluation for potential initiation of anticoagulation. If on the cartilage is contraindicated may consider watchman device 3.  History of TIA: Discussion as above awaiting GI evaluation for on the cartilage   Medication Adjustments/Labs and Tests Ordered: Current medicines are reviewed at length with the patient today.  Concerns regarding medicines are outlined above.  No orders of the defined types were placed in this encounter.  Medication changes: No orders of the defined types were placed in this encounter.   Signed, Park Liter, MD, [facility] 02/23/2017 3:07 PM    [place]

## 2017-03-01 DIAGNOSIS — E875 Hyperkalemia: Secondary | ICD-10-CM | POA: Diagnosis not present

## 2017-03-01 DIAGNOSIS — E871 Hypo-osmolality and hyponatremia: Secondary | ICD-10-CM | POA: Diagnosis not present

## 2017-03-01 DIAGNOSIS — R5383 Other fatigue: Secondary | ICD-10-CM | POA: Diagnosis not present

## 2017-03-01 DIAGNOSIS — N183 Chronic kidney disease, stage 3 (moderate): Secondary | ICD-10-CM | POA: Diagnosis not present

## 2017-03-06 DIAGNOSIS — E871 Hypo-osmolality and hyponatremia: Secondary | ICD-10-CM | POA: Diagnosis not present

## 2017-03-06 DIAGNOSIS — R531 Weakness: Secondary | ICD-10-CM | POA: Diagnosis not present

## 2017-03-06 DIAGNOSIS — R112 Nausea with vomiting, unspecified: Secondary | ICD-10-CM | POA: Diagnosis not present

## 2017-03-07 DIAGNOSIS — E871 Hypo-osmolality and hyponatremia: Secondary | ICD-10-CM | POA: Diagnosis not present

## 2017-03-07 DIAGNOSIS — I639 Cerebral infarction, unspecified: Secondary | ICD-10-CM | POA: Diagnosis not present

## 2017-03-07 DIAGNOSIS — Z7902 Long term (current) use of antithrombotics/antiplatelets: Secondary | ICD-10-CM | POA: Diagnosis not present

## 2017-03-07 DIAGNOSIS — M1990 Unspecified osteoarthritis, unspecified site: Secondary | ICD-10-CM | POA: Diagnosis not present

## 2017-03-07 DIAGNOSIS — R29810 Facial weakness: Secondary | ICD-10-CM | POA: Diagnosis not present

## 2017-03-07 DIAGNOSIS — I119 Hypertensive heart disease without heart failure: Secondary | ICD-10-CM | POA: Diagnosis not present

## 2017-03-07 DIAGNOSIS — Z79899 Other long term (current) drug therapy: Secondary | ICD-10-CM | POA: Diagnosis not present

## 2017-03-07 DIAGNOSIS — E860 Dehydration: Secondary | ICD-10-CM | POA: Diagnosis not present

## 2017-03-07 DIAGNOSIS — R471 Dysarthria and anarthria: Secondary | ICD-10-CM | POA: Diagnosis not present

## 2017-03-07 DIAGNOSIS — I161 Hypertensive emergency: Secondary | ICD-10-CM | POA: Diagnosis not present

## 2017-03-07 DIAGNOSIS — R29706 NIHSS score 6: Secondary | ICD-10-CM | POA: Diagnosis not present

## 2017-03-07 DIAGNOSIS — F419 Anxiety disorder, unspecified: Secondary | ICD-10-CM | POA: Diagnosis not present

## 2017-03-07 DIAGNOSIS — E781 Pure hyperglyceridemia: Secondary | ICD-10-CM | POA: Diagnosis not present

## 2017-03-07 DIAGNOSIS — I10 Essential (primary) hypertension: Secondary | ICD-10-CM | POA: Diagnosis not present

## 2017-03-07 DIAGNOSIS — R112 Nausea with vomiting, unspecified: Secondary | ICD-10-CM | POA: Diagnosis not present

## 2017-03-07 DIAGNOSIS — R531 Weakness: Secondary | ICD-10-CM | POA: Diagnosis not present

## 2017-03-07 DIAGNOSIS — R55 Syncope and collapse: Secondary | ICD-10-CM | POA: Diagnosis not present

## 2017-03-07 DIAGNOSIS — E7800 Pure hypercholesterolemia, unspecified: Secondary | ICD-10-CM | POA: Diagnosis not present

## 2017-03-07 DIAGNOSIS — Z8673 Personal history of transient ischemic attack (TIA), and cerebral infarction without residual deficits: Secondary | ICD-10-CM | POA: Diagnosis not present

## 2017-03-07 DIAGNOSIS — G9349 Other encephalopathy: Secondary | ICD-10-CM | POA: Diagnosis not present

## 2017-03-08 ENCOUNTER — Inpatient Hospital Stay (HOSPITAL_COMMUNITY)
Admission: AD | Admit: 2017-03-08 | Discharge: 2017-03-12 | DRG: 069 | Disposition: A | Discharge status: Home / Self Care | Payer: PPO | Source: Other Acute Inpatient Hospital | Attending: Neurology | Admitting: Neurology

## 2017-03-08 ENCOUNTER — Encounter (HOSPITAL_COMMUNITY): Payer: Self-pay | Admitting: *Deleted

## 2017-03-08 ENCOUNTER — Inpatient Hospital Stay (HOSPITAL_COMMUNITY): Payer: PPO

## 2017-03-08 DIAGNOSIS — K648 Other hemorrhoids: Secondary | ICD-10-CM | POA: Diagnosis present

## 2017-03-08 DIAGNOSIS — Z79899 Other long term (current) drug therapy: Secondary | ICD-10-CM | POA: Diagnosis not present

## 2017-03-08 DIAGNOSIS — E860 Dehydration: Secondary | ICD-10-CM | POA: Diagnosis present

## 2017-03-08 DIAGNOSIS — K5730 Diverticulosis of large intestine without perforation or abscess without bleeding: Secondary | ICD-10-CM | POA: Diagnosis not present

## 2017-03-08 DIAGNOSIS — I480 Paroxysmal atrial fibrillation: Secondary | ICD-10-CM | POA: Diagnosis present

## 2017-03-08 DIAGNOSIS — Z8673 Personal history of transient ischemic attack (TIA), and cerebral infarction without residual deficits: Secondary | ICD-10-CM

## 2017-03-08 DIAGNOSIS — R55 Syncope and collapse: Secondary | ICD-10-CM | POA: Diagnosis not present

## 2017-03-08 DIAGNOSIS — I639 Cerebral infarction, unspecified: Secondary | ICD-10-CM | POA: Diagnosis not present

## 2017-03-08 DIAGNOSIS — E871 Hypo-osmolality and hyponatremia: Secondary | ICD-10-CM | POA: Diagnosis present

## 2017-03-08 DIAGNOSIS — I10 Essential (primary) hypertension: Secondary | ICD-10-CM | POA: Diagnosis present

## 2017-03-08 DIAGNOSIS — E7800 Pure hypercholesterolemia, unspecified: Secondary | ICD-10-CM | POA: Diagnosis present

## 2017-03-08 DIAGNOSIS — R471 Dysarthria and anarthria: Secondary | ICD-10-CM | POA: Diagnosis not present

## 2017-03-08 DIAGNOSIS — D125 Benign neoplasm of sigmoid colon: Secondary | ICD-10-CM | POA: Diagnosis not present

## 2017-03-08 DIAGNOSIS — I161 Hypertensive emergency: Secondary | ICD-10-CM | POA: Diagnosis not present

## 2017-03-08 DIAGNOSIS — I63412 Cerebral infarction due to embolism of left middle cerebral artery: Secondary | ICD-10-CM | POA: Diagnosis not present

## 2017-03-08 DIAGNOSIS — F419 Anxiety disorder, unspecified: Secondary | ICD-10-CM | POA: Diagnosis present

## 2017-03-08 DIAGNOSIS — I361 Nonrheumatic tricuspid (valve) insufficiency: Secondary | ICD-10-CM | POA: Diagnosis not present

## 2017-03-08 DIAGNOSIS — K644 Residual hemorrhoidal skin tags: Secondary | ICD-10-CM | POA: Diagnosis present

## 2017-03-08 DIAGNOSIS — E781 Pure hyperglyceridemia: Secondary | ICD-10-CM | POA: Diagnosis not present

## 2017-03-08 DIAGNOSIS — R195 Other fecal abnormalities: Secondary | ICD-10-CM | POA: Diagnosis not present

## 2017-03-08 DIAGNOSIS — E785 Hyperlipidemia, unspecified: Secondary | ICD-10-CM

## 2017-03-08 DIAGNOSIS — D128 Benign neoplasm of rectum: Secondary | ICD-10-CM | POA: Diagnosis present

## 2017-03-08 DIAGNOSIS — G9349 Other encephalopathy: Secondary | ICD-10-CM | POA: Diagnosis not present

## 2017-03-08 DIAGNOSIS — E782 Mixed hyperlipidemia: Secondary | ICD-10-CM

## 2017-03-08 DIAGNOSIS — K621 Rectal polyp: Secondary | ICD-10-CM | POA: Diagnosis present

## 2017-03-08 DIAGNOSIS — I119 Hypertensive heart disease without heart failure: Secondary | ICD-10-CM | POA: Diagnosis not present

## 2017-03-08 DIAGNOSIS — R29810 Facial weakness: Secondary | ICD-10-CM | POA: Diagnosis not present

## 2017-03-08 DIAGNOSIS — K635 Polyp of colon: Secondary | ICD-10-CM | POA: Diagnosis not present

## 2017-03-08 DIAGNOSIS — G459 Transient cerebral ischemic attack, unspecified: Secondary | ICD-10-CM | POA: Diagnosis not present

## 2017-03-08 DIAGNOSIS — R29706 NIHSS score 6: Secondary | ICD-10-CM | POA: Diagnosis not present

## 2017-03-08 DIAGNOSIS — Z9282 Status post administration of tPA (rtPA) in a different facility within the last 24 hours prior to admission to current facility: Secondary | ICD-10-CM | POA: Diagnosis not present

## 2017-03-08 DIAGNOSIS — D649 Anemia, unspecified: Secondary | ICD-10-CM

## 2017-03-08 DIAGNOSIS — R1013 Epigastric pain: Secondary | ICD-10-CM

## 2017-03-08 DIAGNOSIS — D62 Acute posthemorrhagic anemia: Secondary | ICD-10-CM | POA: Diagnosis not present

## 2017-03-08 DIAGNOSIS — M1990 Unspecified osteoarthritis, unspecified site: Secondary | ICD-10-CM | POA: Diagnosis not present

## 2017-03-08 DIAGNOSIS — Z7902 Long term (current) use of antithrombotics/antiplatelets: Secondary | ICD-10-CM | POA: Diagnosis not present

## 2017-03-08 DIAGNOSIS — R112 Nausea with vomiting, unspecified: Secondary | ICD-10-CM | POA: Diagnosis not present

## 2017-03-08 DIAGNOSIS — R630 Anorexia: Secondary | ICD-10-CM | POA: Diagnosis not present

## 2017-03-08 DIAGNOSIS — R531 Weakness: Secondary | ICD-10-CM | POA: Diagnosis not present

## 2017-03-08 HISTORY — DX: Pure hypercholesterolemia, unspecified: E78.00

## 2017-03-08 HISTORY — DX: Cerebral infarction, unspecified: I63.9

## 2017-03-08 LAB — COMPREHENSIVE METABOLIC PANEL
ALT: 12 U/L — ABNORMAL LOW (ref 14–54)
ANION GAP: 9 (ref 5–15)
AST: 19 U/L (ref 15–41)
Albumin: 3.2 g/dL — ABNORMAL LOW (ref 3.5–5.0)
Alkaline Phosphatase: 59 U/L (ref 38–126)
BUN: 11 mg/dL (ref 6–20)
CHLORIDE: 100 mmol/L — AB (ref 101–111)
CO2: 20 mmol/L — ABNORMAL LOW (ref 22–32)
CREATININE: 1.08 mg/dL — AB (ref 0.44–1.00)
Calcium: 8.2 mg/dL — ABNORMAL LOW (ref 8.9–10.3)
GFR, EST AFRICAN AMERICAN: 53 mL/min — AB (ref >60)
GFR, EST NON AFRICAN AMERICAN: 46 mL/min — AB (ref >60)
Glucose, Bld: 114 mg/dL — ABNORMAL HIGH (ref 65–99)
POTASSIUM: 3.5 mmol/L (ref 3.5–5.1)
SODIUM: 129 mmol/L — AB (ref 135–145)
Total Bilirubin: 0.8 mg/dL (ref 0.3–1.2)
Total Protein: 5.8 g/dL — ABNORMAL LOW (ref 6.5–8.1)

## 2017-03-08 LAB — CBC
HEMATOCRIT: 37 % (ref 36.0–46.0)
HEMOGLOBIN: 12.5 g/dL (ref 12.0–15.0)
MCH: 26.5 pg (ref 26.0–34.0)
MCHC: 33.8 g/dL (ref 30.0–36.0)
MCV: 78.4 fL (ref 78.0–100.0)
PLATELETS: 160 10*3/uL (ref 150–400)
RBC: 4.72 MIL/uL (ref 3.87–5.11)
RDW: 16.1 % — ABNORMAL HIGH (ref 11.5–15.5)
WBC: 7.4 10*3/uL (ref 4.0–10.5)

## 2017-03-08 LAB — MRSA PCR SCREENING: MRSA BY PCR: NEGATIVE

## 2017-03-08 MED ORDER — ACETAMINOPHEN 650 MG RE SUPP: 650.0000 mg | RECTAL | Status: DC | PRN

## 2017-03-08 MED ORDER — ATORVASTATIN CALCIUM 10 MG PO TABS
20.0000 mg | ORAL_TABLET | Freq: Every day | ORAL | Status: DC
  Administered 2017-03-09 – 2017-03-11 (×3): 20 mg via ORAL
  Filled 2017-03-08: qty 1
  Filled 2017-03-08 (×2): qty 2

## 2017-03-08 MED ORDER — LABETALOL HCL 5 MG/ML IV SOLN
10.0000 mg | INTRAVENOUS | Status: DC | PRN
Start: 2017-03-08 — End: 2017-03-09
  Administered 2017-03-09: 10 mg via INTRAVENOUS
  Filled 2017-03-08: qty 4

## 2017-03-08 MED ORDER — STROKE: EARLY STAGES OF RECOVERY BOOK
Freq: Once | Status: DC
  Filled 2017-03-08: qty 1

## 2017-03-08 MED ORDER — ACETAMINOPHEN 325 MG PO TABS
650.0000 mg | ORAL_TABLET | ORAL | Status: DC | PRN
Start: 2017-03-08 — End: 2017-03-12

## 2017-03-08 MED ORDER — INSULIN ASPART 100 UNIT/ML ~~LOC~~ SOLN: 2.0000 [IU] | SUBCUTANEOUS | Status: DC

## 2017-03-08 MED ORDER — ACETAMINOPHEN 160 MG/5ML PO SOLN: 650.0000 mg | ORAL | Status: DC | PRN

## 2017-03-08 MED ORDER — SODIUM CHLORIDE 0.9 % IV SOLN
INTRAVENOUS | Status: DC
  Administered 2017-03-10: 17:00:00 via INTRAVENOUS

## 2017-03-08 NOTE — H&P (Signed)
Neurology H&P  CC: aphasia  History is obtained from:patient, referring MD  HPI: Sherry Hess is a 83 y.o. female who was admitted on 10/21 with generalized weakness felt due to hyponatremia. She had decreased PO intake, and then was scheduled for colonoscopy due to hemoccult positive stool. She did the prep and was weak. At [place], her Na was found to be 117 and she was admitted for IV hydration. She improved and  Then while her physician(Sendil [place] Pink) was discharging her, she developed sudden onset aphasia, and right facial droop. She then started speaking, but was severely dysarthric.  She was evaluated by tele-neurology who administered tPA. Following tPA, she markedly improved and is now back to baseline.   She remembers the events, but seems foggy on specifics.   LKW: 11am  tpa given?: yes  ROS: A 14 point ROS was performed and is negative except as noted in the HPI.   Past Medical History:  Diagnosis Date  . Anxiety   . Hypertension   . TIA (transient ischemic attack)      Family History  Problem Relation Age of Onset  . Cancer Father        lung  . Stroke Mother   . Cancer Brother        x2 bladder  . Ataxia Neg Hx   . Chorea Neg Hx   . Dementia Neg Hx   . Mental retardation Neg Hx   . Migraines Neg Hx   . Multiple sclerosis Neg Hx   . Neurofibromatosis Neg Hx   . Neuropathy Neg Hx   . Parkinsonism Neg Hx   . Seizures Neg Hx      Social History:  reports that she has never smoked. She has never used smokeless tobacco. She reports that she does not drink alcohol or use drugs.   Exam: Current vital signs: There were no vitals taken for this visit. Vital signs in last 24 hours:    Physical Exam  Constitutional: Appears well-developed and well-nourished.  Psych: Affect appropriate to situation Eyes: No scleral injection HENT: No OP obstrucion Head: Normocephalic.  Cardiovascular: Normal rate and regular rhythm.  Respiratory: Effort normal  and breath sounds normal to anterior ascultation GI: Soft.  No distension. There is no tenderness.  Skin: WDI  Neuro: Mental Status: Patient is awake, alert, oriented to person, place, month, year, and situation. Patient is able to give a clear and coherent history. No signs of aphasia or neglect Cranial Nerves: II: Visual Fields are full. Pupils are equal, round, and reactive to light.   III,IV, VI: eyes are dysconjugate, right eye outwardly deviated  V: Facial sensation is symmetric to temperature VII: Facial movement is symmetric.  VIII: hearing is intact to voice X: Uvula elevates symmetrically XI: Shoulder shrug is symmetric. XII: tongue is midline without atrophy or fasciculations.  Motor: Tone is normal. Bulk is normal. 5/5 strength was present in all four extremities.  Sensory: Sensation is symmetric to light touch and temperature in the arms and legs. Cerebellar: FNF and HKS are intact bilaterally  I have reviewed labs in epic and the results pertinent to this consultation are: Na 117 -> 129 most recently   Impression: 83 year old female with clinical TIA following administration of IV TPA.  Given her foggy memory of the event, I will order description of the witnessed event TIA/stroke is much more likely diagnosis.  Recommendations: 1. HgbA1c, fasting lipid panel 2. MRI, MRA  of the brain without contrast 3. Frequent  neuro checks 4. Echocardiogram 5. Carotid dopplers 6. Prophylactic therapy-Antiplatelet med: Aspirin - dose 325mg  PO or 300mg  PR 7. Risk factor modification 8. Telemetry monitoring 9. PT consult, OT consult, Speech consult 10. EEG  11. please page stroke NP  Or  PA  Or MD  from 8am -4 pm as this patient will be followed by the stroke team at this point.   You can look them up on [email or web address]     Roland Rack, MD [facility] [phone]  If 7pm- 7am, please page neurology on call as listed in [place]. 03/08/2017  6:09 PM

## 2017-03-09 ENCOUNTER — Inpatient Hospital Stay (HOSPITAL_COMMUNITY): Payer: PPO

## 2017-03-09 DIAGNOSIS — R630 Anorexia: Secondary | ICD-10-CM

## 2017-03-09 DIAGNOSIS — I480 Paroxysmal atrial fibrillation: Secondary | ICD-10-CM

## 2017-03-09 DIAGNOSIS — I639 Cerebral infarction, unspecified: Secondary | ICD-10-CM

## 2017-03-09 DIAGNOSIS — I10 Essential (primary) hypertension: Secondary | ICD-10-CM

## 2017-03-09 DIAGNOSIS — E860 Dehydration: Secondary | ICD-10-CM

## 2017-03-09 DIAGNOSIS — R1013 Epigastric pain: Secondary | ICD-10-CM

## 2017-03-09 DIAGNOSIS — E871 Hypo-osmolality and hyponatremia: Secondary | ICD-10-CM

## 2017-03-09 DIAGNOSIS — E782 Mixed hyperlipidemia: Secondary | ICD-10-CM

## 2017-03-09 DIAGNOSIS — I63412 Cerebral infarction due to embolism of left middle cerebral artery: Secondary | ICD-10-CM

## 2017-03-09 DIAGNOSIS — E785 Hyperlipidemia, unspecified: Secondary | ICD-10-CM

## 2017-03-09 DIAGNOSIS — R195 Other fecal abnormalities: Secondary | ICD-10-CM | POA: Insufficient documentation

## 2017-03-09 DIAGNOSIS — D649 Anemia, unspecified: Secondary | ICD-10-CM

## 2017-03-09 HISTORY — DX: Other fecal abnormalities: R19.5

## 2017-03-09 HISTORY — DX: Epigastric pain: R10.13

## 2017-03-09 HISTORY — DX: Hyperlipidemia, unspecified: E78.5

## 2017-03-09 HISTORY — DX: Mixed hyperlipidemia: E78.2

## 2017-03-09 LAB — LIPID PANEL
Cholesterol: 117 mg/dL (ref 0–200)
HDL: 36 mg/dL — ABNORMAL LOW (ref >40)
LDL CALC: 71 mg/dL (ref 0–99)
Total CHOL/HDL Ratio: 3.3 RATIO
Triglycerides: 50 mg/dL (ref <150)
VLDL: 10 mg/dL (ref 0–40)

## 2017-03-09 LAB — HEMOGLOBIN A1C
HEMOGLOBIN A1C: 5.2 % (ref 4.8–5.6)
Mean Plasma Glucose: 102.54 mg/dL

## 2017-03-09 MED ORDER — BUSPIRONE HCL 10 MG PO TABS
15.0000 mg | ORAL_TABLET | Freq: Two times a day (BID) | ORAL | Status: DC
Start: 2017-03-09 — End: 2017-03-12
  Administered 2017-03-09 – 2017-03-12 (×7): 15 mg via ORAL
  Filled 2017-03-09 (×2): qty 1
  Filled 2017-03-09 (×3): qty 2
  Filled 2017-03-09 (×2): qty 1
  Filled 2017-03-09: qty 2

## 2017-03-09 MED ORDER — ASPIRIN EC 325 MG PO TBEC
325.0000 mg | DELAYED_RELEASE_TABLET | Freq: Every day | ORAL | Status: DC
  Administered 2017-03-09 – 2017-03-11 (×3): 325 mg via ORAL
  Filled 2017-03-09 (×3): qty 1

## 2017-03-09 MED ORDER — CARVEDILOL 12.5 MG PO TABS
12.5000 mg | ORAL_TABLET | Freq: Two times a day (BID) | ORAL | Status: DC
  Administered 2017-03-09 – 2017-03-12 (×6): 12.5 mg via ORAL
  Filled 2017-03-09 (×6): qty 1

## 2017-03-09 MED ORDER — HEPARIN SODIUM (PORCINE) 5000 UNIT/ML IJ SOLN
5000.0000 [IU] | Freq: Three times a day (TID) | INTRAMUSCULAR | Status: AC
  Administered 2017-03-09 – 2017-03-10 (×4): 5000 [IU] via SUBCUTANEOUS
  Filled 2017-03-09 (×4): qty 1

## 2017-03-09 MED ORDER — LABETALOL HCL 5 MG/ML IV SOLN
5.0000 mg | INTRAVENOUS | Status: DC | PRN
  Administered 2017-03-10 – 2017-03-12 (×2): 10 mg via INTRAVENOUS
  Filled 2017-03-09 (×2): qty 4

## 2017-03-09 NOTE — Evaluation (Signed)
Occupational Therapy Evaluation Patient Details Name: Sherry Hess MRN: [id number] DOB: 1931-03-04 Today's Date: 03/09/2017    History of Present Illness Pt is an 83 y.o. female who was admitted to [facility] 10/21 with generalized weakness due to hyponatremia. She had decreased PO intake, and then was scheduled for colonoscopy due to hemoccult positive stool. She did the prep and was weak. At [place], her Na was found to be 117 and she was admitted for IV hydration. She improved and Then while her physician Gevena Barre) was discharging her, she developed sudden onset aphasia, and right facial droop. She then started speaking, but was severely dysarthric. She was evaluated by tele-neurology who administered tPA. Following tPA, she markedly improved and is now back to baseline. MRI negative for acute infarction. PMH significant for anxiety and hypertension.    Clinical Impression   PTA, pt was independent with ADL and functional mobility. She lives with her son. Pt currently presents with slightly decreased dynamic balance and L lateral lean when distracted impacting her ability to participate in ADL at PLOF. She currently requires up to min assist for safety with functional mobility and ADL transfers. Pt would benefit from continued OT services while admitted to improve independence with ADL and functional mobility. Do not anticipate need for further OT services post-acute D/C. Will continue to follow while admitted.     Follow Up Recommendations  No OT follow up;Supervision/Assistance - 24 hour    Equipment Recommendations  3 in 1 bedside commode (RW 2-wheeled)    Recommendations for Other Services       Precautions / Restrictions Precautions Precautions: Fall Precaution Comments: slightly unsteady on feet      Mobility Bed Mobility Overal bed mobility: Needs Assistance Bed Mobility: Supine to Sit;Sit to Supine     Supine to sit: Supervision Sit to supine:  Supervision   General bed mobility comments: Supervision for safety.   Transfers Overall transfer level: Needs assistance Equipment used: 1 person hand held assist Transfers: Sit to/from Stand Sit to Stand: Min guard         General transfer comment: Min guard assist for safety.     Balance Overall balance assessment: Needs assistance Sitting-balance support: No upper extremity supported;Feet supported Sitting balance-Leahy Scale: Good     Standing balance support: No upper extremity supported;Single extremity supported;During functional activity Standing balance-Leahy Scale: Fair Standing balance comment: Up to min assist for dynamic tasks.                            ADL either performed or assessed with clinical judgement   ADL Overall ADL's : Needs assistance/impaired Eating/Feeding: Set up;Sitting   Grooming: Min guard;Standing   Upper Body Bathing: Set up;Sitting   Lower Body Bathing: Min guard;Sit to/from stand   Upper Body Dressing : Set up;Sitting   Lower Body Dressing: Min guard;Sit to/from stand   Toilet Transfer: Minimal assistance;Ambulation Toilet Transfer Details (indicate cue type and reason): Listing to the L when distracted. Toileting- Water quality scientist and Hygiene: Min guard;Sit to/from stand       Functional mobility during ADLs: Minimal assistance General ADL Comments: Min assist for stability. Pt able to complete ambulation in hallway and when distracted would tend to list to the L.      Vision Baseline Vision/History: Wears glasses Wears Glasses: Reading only (but was wearing on arrival) Patient Visual Report: No change from baseline Vision Assessment?: Yes Eye Alignment: Within Functional Limits  Ocular Range of Motion: Within Functional Limits Alignment/Gaze Preference: Within Defined Limits Tracking/Visual Pursuits: Able to track stimulus in all quads without difficulty Convergence: Within functional limits Visual  Fields:  (difficulty with R upper quadrant) Additional Comments: Pt with some difficulty with L upper quadrant on confrontation testing. Functional for tasks assessed.     Perception     Praxis      Pertinent Vitals/Pain Pain Assessment: No/denies pain     Hand Dominance Right   Extremity/Trunk Assessment Upper Extremity Assessment Upper Extremity Assessment: LUE deficits/detail LUE Deficits / Details: slightly diminished L hand coordination   Lower Extremity Assessment Lower Extremity Assessment: Overall WFL for tasks assessed       Communication Communication Communication: No difficulties   Cognition Arousal/Alertness: Awake/alert Behavior During Therapy: WFL for tasks assessed/performed Overall Cognitive Status: Within Functional Limits for tasks assessed                                 General Comments: Functional but difficulty with serial 7's.    General Comments  VSS throughout. HR low ranging from 52 to upper 60s    Exercises     Shoulder Instructions      Home Living Family/patient expects to be discharged to:: Private residence Living Arrangements: Children Available Help at Discharge: Family;Available PRN/intermittently Type of Home: House       Home Layout: One level     Bathroom Shower/Tub: Walk-in shower         Home Equipment: Kasandra Knudsen - single point   Additional Comments: Need to further assess stairs to enter home.       Prior Functioning/Environment Level of Independence: Independent        Comments: Drives and does home management tasks. Son available to help with IADL if needed.         OT Problem List: Decreased activity tolerance;Impaired balance (sitting and/or standing);Decreased safety awareness      OT Treatment/Interventions: Self-care/ADL training;Therapeutic exercise;DME and/or AE instruction;Energy conservation;Therapeutic activities;Patient/family education;Balance training    OT Goals(Current goals  can be found in the care plan section) Acute Rehab OT Goals Patient Stated Goal: to go home OT Goal Formulation: With patient Time For Goal Achievement: 03/23/17 Potential to Achieve Goals: Good ADL Goals Pt Will Perform Grooming: with modified independence;standing Pt Will Perform Lower Body Dressing: with modified independence;sit to/from stand Pt Will Transfer to Toilet: with modified independence;ambulating;regular height toilet Pt Will Perform Toileting - Clothing Manipulation and hygiene: with modified independence;sit to/from stand Pt Will Perform Tub/Shower Transfer: with modified independence;Shower transfer;ambulating;3 in 1  OT Frequency: Min 2X/week   Barriers to D/C:            Co-evaluation              AM-PAC PT "6 Clicks" Daily Activity     Outcome Measure Help from another person eating meals?: None Help from another person taking care of personal grooming?: A Little Help from another person toileting, which includes using toliet, bedpan, or urinal?: A Little Help from another person bathing (including washing, rinsing, drying)?: A Little Help from another person to put on and taking off regular upper body clothing?: None Help from another person to put on and taking off regular lower body clothing?: A Little 6 Click Score: 20   End of Session Equipment Utilized During Treatment: Gait belt Nurse Communication: Mobility status (pt needs assistance with lunch order)  Activity Tolerance:  Patient tolerated treatment well Patient left: in bed;with call bell/phone within reach;with family/visitor present  OT Visit Diagnosis: Other abnormalities of gait and mobility (R26.89)                Time: 8921-1941 OT Time Calculation (min): 24 min Charges:  OT General Charges $OT Visit: 1 Visit OT Evaluation $OT Eval Moderate Complexity: 1 Mod OT Treatments $Self Care/Home Management : 8-22 mins G-Codes:     Norman Herrlich, MS OTR/L  Pager:  [place] A Cierrah Dace 03/09/2017, 5:17 PM

## 2017-03-09 NOTE — Evaluation (Signed)
Physical Therapy Evaluation Patient Details Name: Sherry Hess MRN: [id number] DOB: 02/23/1931 Today's Date: 03/09/2017   History of Present Illness  Pt is an 83 y.o. female who was admitted to [facility] 10/21 with generalized weakness due to hyponatremia. She had decreased PO intake, and then was scheduled for colonoscopy due to hemoccult positive stool. She did the prep and was weak. At [place], her Na was found to be 117 and she was admitted for IV hydration. She improved and Then while her physician Gevena Barre) was discharging her, she developed sudden onset aphasia, and right facial droop. She then started speaking, but was severely dysarthric. She was evaluated by tele-neurology who administered tPA. Following tPA, she markedly improved and is now back to baseline. MRI negative for acute infarction. PMH significant for anxiety and hypertension.   Clinical Impression  Pt with consistent LOB to the right during gait requiring min assist for balance.  She may do well with cane only vs RW for home use.  She has a h/o falls at baseline and would benefit from OP PT f/u at discharge for balance and gait training.   PT to follow acutely for deficits listed below.       Follow Up Recommendations Outpatient PT    Equipment Recommendations  None recommended by PT    Recommendations for Other Services   NA    Precautions / Restrictions Precautions Precautions: Fall Precaution Comments: slightly unsteady on feet      Mobility  Bed Mobility Overal bed mobility: Needs Assistance Bed Mobility: Supine to Sit;Sit to Supine     Supine to sit: Supervision Sit to supine: Supervision   General bed mobility comments: Supervision for safety on compliant bed due to tendancy for posterior LOB during transition.   Transfers Overall transfer level: Needs assistance Equipment used: 1 person hand held assist Transfers: Sit to/from Stand Sit to Stand: Min guard          General transfer comment: Min guard assist for safety.   Ambulation/Gait Ambulation/Gait assistance: Min assist Ambulation Distance (Feet): 150 Feet Assistive device: 1 person hand held assist Gait Pattern/deviations: Step-through pattern;Staggering right;Drifts right/left     General Gait Details: Pt with right lateral lean, LOB and drifting right.  Per family, not sure if pt did this PTA or not, but does have h/o falls at [place].    Modified Rankin (Stroke Patients Only) Modified Rankin (Stroke Patients Only) Pre-Morbid Rankin Score: No significant disability Modified Rankin: Moderately severe disability     Balance Overall balance assessment: Needs assistance Sitting-balance support: No upper extremity supported;Feet supported Sitting balance-Leahy Scale: Good Sitting balance - Comments: Can reach down in sitting to adjust socks and come back to sitting EOB.    Standing balance support: No upper extremity supported;Single extremity supported;During functional activity Standing balance-Leahy Scale: Fair Standing balance comment: supervision statically                             Pertinent Vitals/Pain Pain Assessment: No/denies pain    Home Living Family/patient expects to be discharged to:: Private residence Living Arrangements: Children Available Help at Discharge: Family;Available PRN/intermittently Type of Home: House       Home Layout: One level Home Equipment: Cane - single point      Prior Function Level of Independence: Independent         Comments: Drives and does home management tasks. Son available to help with IADL if needed.  Hand Dominance   Dominant Hand: Right    Extremity/Trunk Assessment   Upper Extremity Assessment Upper Extremity Assessment: Defer to OT evaluation LUE Deficits / Details: slightly diminished L hand coordination    Lower Extremity Assessment Lower Extremity Assessment: Overall WFL for tasks  assessed (MMT 5/5, HTS test WNL, sensation intact bil to LT)    Cervical / Trunk Assessment Cervical / Trunk Assessment: Normal  Communication   Communication: No difficulties  Cognition Arousal/Alertness: Awake/alert Behavior During Therapy: WFL for tasks assessed/performed Overall Cognitive Status: Within Functional Limits for tasks assessed                                 General Comments: Per gross functional assessment.             Assessment/Plan    PT Assessment Patient needs continued PT services  PT Problem List Decreased balance;Decreased mobility;Decreased knowledge of use of DME       PT Treatment Interventions DME instruction;Gait training;Stair training;Functional mobility training;Therapeutic activities;Therapeutic exercise;Neuromuscular re-education;Balance training;Patient/family education    PT Goals (Current goals can be found in the Care Plan section)  Acute Rehab PT Goals Patient Stated Goal: to go home PT Goal Formulation: With patient/family Time For Goal Achievement: 03/23/17 Potential to Achieve Goals: Good    Frequency Min 4X/week    AM-PAC PT "6 Clicks" Daily Activity  Outcome Measure Difficulty turning over in bed (including adjusting bedclothes, sheets and blankets)?: None Difficulty moving from lying on back to sitting on the side of the bed? : None Difficulty sitting down on and standing up from a chair with arms (e.g., wheelchair, bedside commode, etc,.)?: Unable Help needed moving to and from a bed to chair (including a wheelchair)?: A Little Help needed walking in hospital room?: A Little Help needed climbing 3-5 steps with a railing? : A Little 6 Click Score: 18    End of Session Equipment Utilized During Treatment: Gait belt Activity Tolerance: Patient tolerated treatment well Patient left: in chair;with call bell/phone within reach;with family/visitor present Nurse Communication: Mobility status PT Visit  Diagnosis: Unsteadiness on feet (R26.81);Difficulty in walking, not elsewhere classified (R26.2);Other symptoms and signs involving the nervous system (M35.361)    Time: 4431-5400 PT Time Calculation (min) (ACUTE ONLY): 19 min   Charges:         Wells Guiles B. Kiele Heavrin, PT, DPT [phone]   PT Evaluation $PT Eval Moderate Complexity: 1 Mod     03/09/2017, 11:08 PM

## 2017-03-09 NOTE — Progress Notes (Signed)
Carotid artery duplex has been completed. Preliminary results can be found in chart review -> CV Proc  03/09/17 1:03 PM Carlos Levering RVT

## 2017-03-09 NOTE — Procedures (Signed)
ELECTROENCEPHALOGRAM REPORT  Date of Study: 03/09/2017  Patient's Name: Sherry Hess MRN: [id number] Date of Birth: 02/17/1931  Referring Provider: Roland Rack, MD  Clinical History: 83 year old woman with paroxysmal atrial fibrillation presents with aphasia  Medications: acetaminophen (TYLENOL)  atorvastatin (LIPITOR) labetalol (NORMODYNE,TRANDATE)  Technical Summary: A multichannel digital EEG recording measured by the international 10-20 system with electrodes applied with paste and impedances below 5000 ohms performed in our laboratory with EKG monitoring in an awake and drowsy patient.  Hyperventilation and photic stimulation were not performed.  The digital EEG was referentially recorded, reformatted, and digitally filtered in a variety of bipolar and referential montages for optimal display.    Description: The patient is awake and drowsy during the recording.  During maximal wakefulness, there is a symmetric, medium voltage 9 Hz posterior dominant rhythm that attenuates with eye opening.  The record is symmetric.  Stage 2 sleep is not seen.  There were no epileptiform discharges or electrographic seizures seen.    EKG lead was unremarkable.  Impression: This awake and drowsy EEG is normal.    Clinical Correlation: A normal EEG does not exclude a clinical diagnosis of epilepsy.  If further clinical questions remain, prolonged EEG may be helpful.  Clinical correlation is advised.   Metta Clines, DO

## 2017-03-09 NOTE — Progress Notes (Signed)
SLP Cancellation Note  Patient Details Name: Sherry Hess MRN: [id number] DOB: 08/27/1930   Cancelled treatment:       Reason Eval/Treat Not Completed: SLP screened, no needs identified, will sign off   Juan Quam Laurice 03/09/2017, 3:15 PM

## 2017-03-09 NOTE — Progress Notes (Signed)
STROKE TEAM PROGRESS NOTE   SUBJECTIVE (INTERVAL HISTORY) Her RN is at the bedside.  Overall she feels her condition is completely resolved. Pt stated that she had GI appointment last week to have colonoscopy but she was in [facility] and could not do the procedure. She currently back to baseline. She denies any GI bleeding in the past only stool guaiac was positive. She admitted that recently she has no appetite, felt nauseous even thinking about food. But denies any abdominal pain or gross GIB. She took today breakfast was ok.    OBJECTIVE Temp:  [98.2 F (36.8 C)-98.6 F (37 C)] 98.4 F (36.9 C) (10/24 0800) Pulse Rate:  [55-87] 62 (10/24 0800) Cardiac Rhythm: Normal sinus rhythm (10/24 0800) Resp:  [12-28] 16 (10/24 0800) BP: (130-187)/(39-94) 187/63 (10/24 0800) SpO2:  [91 %-100 %] 98 % (10/24 0800) Weight:  [131 lb 6.3 oz (59.6 kg)] 131 lb 6.3 oz (59.6 kg) (10/23 1800)  No results for input(s): GLUCAP in the last 168 hours.  Recent Labs Lab 03/08/17 1904  NA 129*  K 3.5  CL 100*  CO2 20*  GLUCOSE 114*  BUN 11  CREATININE 1.08*  CALCIUM 8.2*    Recent Labs Lab 03/08/17 1904  AST 19  ALT 12*  ALKPHOS 59  BILITOT 0.8  PROT 5.8*  ALBUMIN 3.2*    Recent Labs Lab 03/08/17 1904  WBC 7.4  HGB 12.5  HCT 37.0  MCV 78.4  PLT 160   No results for input(s): CKTOTAL, CKMB, CKMBINDEX, TROPONINI in the last 168 hours. No results for input(s): LABPROT, INR in the last 72 hours. No results for input(s): COLORURINE, LABSPEC, [place], GLUCOSEU, HGBUR, BILIRUBINUR, KETONESUR, PROTEINUR, UROBILINOGEN, NITRITE, LEUKOCYTESUR in the last 72 hours.  Invalid input(s): APPERANCEUR     Component Value Date/Time   CHOL 117 03/09/2017 0226   TRIG 50 03/09/2017 0226   HDL 36 (L) 03/09/2017 0226   CHOLHDL 3.3 03/09/2017 0226   VLDL 10 03/09/2017 0226   LDLCALC 71 03/09/2017 0226   Lab Results  Component Value Date   HGBA1C 5.2 03/09/2017   No results found for:  LABOPIA, COCAINSCRNUR, LABBENZ, AMPHETMU, THCU, LABBARB  No results for input(s): ETH in the last 168 hours.  I have personally reviewed the radiological images below and agree with the radiology interpretations.  MRI and MRA pending  EEG normal EEG  Carotid Doppler  pending  2D Echocardiogram  pending   PHYSICAL EXAM  Temp:  [98.2 F (36.8 C)-98.6 F (37 C)] 98.4 F (36.9 C) (10/24 0800) Pulse Rate:  [55-87] 62 (10/24 0800) Resp:  [12-28] 16 (10/24 0800) BP: (130-187)/(39-94) 187/63 (10/24 0800) SpO2:  [91 %-100 %] 98 % (10/24 0800) Weight:  [131 lb 6.3 oz (59.6 kg)] 131 lb 6.3 oz (59.6 kg) (10/23 1800)  General - Well nourished, well developed, in no apparent distress.  Ophthalmologic - fundi not visualized due to noncooperation.  Cardiovascular - Regular rate and rhythm with no murmur, not in afib.  Mental Status -  Level of arousal and orientation to time, place, and person were intact. Language including expression, naming, repetition, comprehension was assessed and found intact. Fund of Knowledge was assessed and was impaired.  Cranial Nerves II - XII - II - Visual field intact OU. III, IV, VI - Extraocular movements intact. V - Facial sensation intact bilaterally. VII - Facial movement intact bilaterally. VIII - Hearing & vestibular intact bilaterally. X - Palate elevates symmetrically. XI - Chin turning & shoulder  shrug intact bilaterally. XII - Tongue protrusion intact.  Motor Strength - [name] strength was normal in all extremities and pronator drift was absent.  Bulk was normal and fasciculations were absent.   Motor Tone - Muscle tone was assessed at the neck and appendages and was normal.  Reflexes - [name] reflexes were symmetrical in all extremities and she had no pathological reflexes.  Sensory - Light touch, temperature/pinprick were assessed and were symmetrical.    Coordination - The patient had normal movements in the hands with  no ataxia or dysmetria.  Tremor was absent.  Gait and Station - deferred.   ASSESSMENT/PLAN Sherry Hess is a 83 y.o. female with history of HTN, HLD, recent diagnosis of afib, positive stool guaiac, TIA admitted for sudden onset aphasia and right facial droop. tPA given at [facility].    Stroke vs. TIA:  left brain, embolic pattern likely secondary to afib not on [facility]  Resultant back to baseline  MRI  Pending   MRA  Pending   Carotid Doppler  Pending   2D Echo  Pending   EEG negative  LDL 71  HgbA1c 5.2  SCDs for VTE prophylaxis  Diet regular Room service appropriate? Yes; Fluid consistency: Thin   clopidogrel 75 mg daily prior to admission, now on No antithrombotic within 24hr of tPA.  Patient counseled to be compliant with her antithrombotic medications  Ongoing aggressive stroke risk factor management  Therapy recommendations:  pending  Disposition:  Pending  Hx of TIA  05/2016 left hand and left face numbness lasting 5 min - CT neg - ASA changed to DAPT and on lipitor  08/2016 LDL 110 and CUS neg  11/2016 - EF 65-70% - cardiac monitoring showed PAF - not on AC due to stool guaiac positive - scheduled for colonoscopy but not done yet  03/06/17 - admitted to [facility] for malaise, and low Na at 117 - improved  PAF  Found on cardiac monitoring   Not on AC due to stool guaiac positive  On plavix PTA  Currently rate controlled on coreg  Has outpt cardiology follow up   Positive stool occult blood  Found to have anemia   Stool guaiac positive at OSH  Colonoscopy scheduled but not able to do due to hospitalization  Decreased appetite but no abdominal pain  Will contact GI to see if we can have it done during this admission  Hypertension Stable at high side Permissive hypertension (OK if <180/105) for 24-48 hours post stroke and then gradually normalized within 5-7 days.  Long term BP goal normotensive  On coreg for rate  control  Hyperlipidemia  Home meds:  lipitor 20   LDL 71, goal < 70  Now lipitor resumed  Continue statin at discharge  Other Stroke Risk Factors  Advanced age  Other Active Problems  Anemia - acute blood loss - Hb 12.5  Elevated Cre - Cre 1.08  Hyponatremia - Na 129  Hospital day # 1  This patient is critically ill due to stroke symptoms post tPA, afib not on AC, GIB, hyponatremia and at significant risk of neurological worsening, death form recurrent stroke, GIB, shock, heart failure, seizure. This patient's care requires constant monitoring of vital signs, hemodynamics, respiratory and cardiac monitoring, review of multiple databases, neurological assessment, discussion with family, other specialists and medical decision making of high complexity. I spent 40 minutes of neurocritical care time in the care of this patient.   Rosalin Hawking, MD PhD  Stroke Neurology 03/09/2017 9:32 AM    To contact Stroke Continuity provider, please refer to [email or web address] After hours, contact General Neurology

## 2017-03-09 NOTE — Progress Notes (Signed)
EEG completed; results pending.    

## 2017-03-09 NOTE — Consult Note (Signed)
[facility] Consult: 1:35 PM 03/09/2017  LOS: 1 day    Referring Provider: dr Sherry Hess  Primary Care Physician:  Sherry Kiel, MD in [place] Primary Gastroenterologist:  In [place] Dr Sherry Hess     Reason for Consultation:  Anemia, FOBT+   HPI: Sherry Hess is a 83 y.o. female.  History TIA 05/2016.  On chronic Plavix.  History of paroxysmal atrial fibrillation noted on heart monitor in ~ 01/2017 but not on anticoagulation. Hypertension.  Hyperlipidemia.  Episodic hyponatremia for last 3 months, managed by tapering off Zoloft, replacing with Buspar, adjusting Hydralazine.    Patient had been scheduled for colonoscopy on Monday 10/21 for evaluation of FOBT positive stool on take home hemoccults, anemia. Hemoglobin was 10.3 with MCV of 82 on 01/26/17.  To her knowledge she has not been checked for iron, B12 deficiency nor started on any oral iron or B12 supplements. She had stopped her Plavix last week, as instructed by GI physician.  She started her clear liquid diet on Sunday but had not started the bowel prep and started to feel quite weak, symptoms consistent with prior episodes of hyponatremia.  She went to the [facility] ED and her sodium was 117.  She was admitted for correction of sodium and IV hydration. She was ready for discharge when she developed acute onset right facial droop and expressive aphasia, dysarthria.  Head MR demonstrated known atrophy and microvascular disease but no acute CVA. She was treated with TPA for a TIA after which her symptoms resolved.   Because of the situation she never ended up undergoing colonoscopy and [facility] is asked to look into performing these while she is here at [facility].  She remains off Plavix.. Hgb 12, MCV 78.  Na 129.    Other than loss of appetite for  about a couple of weeks, her GI review of systems is unremarkable.  No dysphasia.  No irregular bowel habits.  No black or bloody stool.  No nausea.  No dysphagia.  No abdominal pain.  No NSAIDs.  Takes omeprazole daily.  Due to lack of appetite and decreased p.o. intake in the last couple of weeks, she has dropped may be 5#.  She did have a colonoscopy about 18 years ago which, by her recall was unremarkable.    Past Medical History:  Diagnosis Date  . Anxiety   . High cholesterol   . Hypertension   . TIA (transient ischemic attack)     Past Surgical History:  Procedure Laterality Date  . TONSILLECTOMY    . VESICOVAGINAL FISTULA CLOSURE W/ TAH      Prior to Admission medications   Medication Sig Start Date End Date Taking? Authorizing Provider  atorvastatin (LIPITOR) 20 MG tablet Take 20 mg by mouth daily.   Yes [provider]  busPIRone (BUSPAR) 15 MG tablet Take 7.5 mg by mouth 2 (two) times daily.    Yes [provider]  carvedilol (COREG) 12.5 MG tablet Take 12.5 mg by mouth 2 (two) times daily.   Yes [provider]  clopidogrel (PLAVIX) 75 MG tablet TAKE 1 TABLET BY MOUTH DAILY Patient taking differently: TAKE 75mg  TABLET BY MOUTH DAILY 02/08/17  Yes Park Liter, MD  Fish Oil-Cholecalciferol (FISH OIL + D3) 1000-1000 MG-UNIT CAPS Take by mouth.   Yes [provider]  hydrALAZINE (APRESOLINE) 25 MG tablet Take 0.5 tablets (12.5 mg total) by mouth 3 (three) times daily. 02/21/17 05/22/17 Yes Park Liter, MD  lisinopril (PRINIVIL,ZESTRIL) 40 MG tablet Take 40 mg by mouth daily.   Yes [provider]  Multiple Minerals-Vitamins (CALCIUM & VIT D3 BONE HEALTH PO) Take by mouth.   Yes [provider]  Multiple Vitamins-Minerals (PRESERVISION AREDS 2+MULTI VIT) CAPS Take 1 capsule by mouth daily.   Yes [provider]  omeprazole (PRILOSEC) 20 MG capsule Take 20 mg by mouth daily.  05/19/16  Yes [provider]  telmisartan (MICARDIS) 80 MG tablet Take 0.5 tablets by mouth at bedtime. 04/27/16  Yes [provider]    Scheduled Meds: .  stroke: mapping our early stages of recovery book   Does not apply Once  . atorvastatin  20 mg Oral q1800  . busPIRone  15 mg Oral BID  . carvedilol  12.5 mg Oral BID   Infusions: . sodium chloride Stopped (03/09/17 1200)   PRN Meds: acetaminophen **OR** [DISCONTINUED] acetaminophen (TYLENOL) oral liquid 160 mg/5 mL **OR** [DISCONTINUED] acetaminophen, labetalol   Allergies as of 03/08/2017 - Review Complete 02/23/2017  Allergen Reaction Noted  . Adhesive [tape]  02/17/2017  . Clonidine derivatives Other (See Comments) 06/06/2013  . Minoxidil  06/06/2013  . Norvasc [amlodipine besylate]  06/06/2013    Family History  Problem Relation Age of Onset  . Cancer Father        lung  . Stroke Mother   . Cancer Brother        x2 bladder  . Ataxia Neg Hx   . Chorea Neg Hx   . Dementia Neg Hx   . Mental retardation Neg Hx   . Migraines Neg Hx   . Multiple sclerosis Neg Hx   . Neurofibromatosis Neg Hx   . Neuropathy Neg Hx   . Parkinsonism Neg Hx   . Seizures Neg Hx     Social History   Social History  . Marital status: Widowed    Spouse name: N/A  . Number of children: N/A  . Years of education: N/A   Occupational History  . Not on file.   Social History Main Topics  . Smoking status: Never Smoker  . Smokeless tobacco: Never Used  . Alcohol use No  . Drug use: No  . Sexual activity: Not on file   Other Topics Concern  . Not on file   Social History Narrative  . No narrative on file    REVIEW OF SYSTEMS: Constitutional: In her normal everyday life, she is quite highly functional.  She cleans her own house.  Up until about 6 months ago, she used to walk a couple of miles several times a week.  However her walking partner had to have knee surgery and she got out of the habit.  She has no trouble negotiating stair  climbs. ENT:  No nose bleeds Pulm: No shortness of breath.  No PND.  No cough. CV:  No palpitations, no LE edema.  Chest pain GU:  No hematuria, no frequency GI:  Per HPI Heme: No unusual bleeding or bruising. Transfusions: Has never undergone blood transfusions. Neuro:  Generally  no headaches, no peripheral tingling or numbness Derm:  No itching, no rash or sores.  Endocrine:  No sweats or chills.  No polyuria or dysuria Immunization: Did not query as to recent vaccinations. Travel:  None beyond local counties in last few months.    PHYSICAL EXAM: Vital signs in last 24 hours: Vitals:   03/09/17 1200 03/09/17 1300  BP: (!) 172/68   Pulse: 64 60  Resp: (!) 23 19  Temp:    SpO2:  98%   Wt Readings from Last 3 Encounters:  03/08/17 59.6 kg (131 lb 6.3 oz)  02/23/17 62.1 kg (136 lb 12.8 oz)  02/17/17 62.5 kg (137 lb 12.8 oz)    General: Pleasant, well-appearing, elderly WF.  Not particularly frail or ill looking. Head: Symmetric facies.  No facial edema.  No signs of head trauma. Eyes: No scleral icterus, no conjunctival pallor.  EOMI. Ears: Not hard of hearing. Nose: No congestion or discharge. Mouth: Has good dental repair.  Upper and lower partial denture plates in place.  Mucosa pink, moist, clear.  Tongue midline. Neck: No JVD, no thyromegaly, no masses. Lungs: Clear bilaterally.  No labored breathing.  No cough. Heart: RRR.  No MRG.  S1, S2 present. Abdomen: Soft.  No masses.  No HSM, bruits, hernias.  Not tender.  Bowel sounds active..   Rectal: Did not perform rectal exam. Musc/Skeltl: No joint swelling, redness or gross deformity.  Some arthritic changes in her fingers. Extremities: No CCE.  Good capillary refill in the toes. Neurologic: Alert.  Oriented x3.  Good historian.  Moves all 4 limbs.  Strength not tested.  No tremors. Skin: No telangiectasia, no significant bruising.  No suspicious lesions or rash. Tattoos: None Nodes: No cervical or inguinal  adenopathy. Psych: Pleasant, cooperative.  Does not seem anxious or depressed.  Intake/Output from previous day: 10/23 0701 - 10/24 0700 In: 975 [I.V.:975] Out: -  Intake/Output this shift: Total I/O In: 270 [I.V.:270] Out: -   LAB RESULTS:  Recent Labs  03/08/17 1904  WBC 7.4  HGB 12.5  HCT 37.0  PLT 160   BMET Lab Results  Component Value Date   NA 129 (L) 03/08/2017   K 3.5 03/08/2017   CL 100 (L) 03/08/2017   CO2 20 (L) 03/08/2017   GLUCOSE 114 (H) 03/08/2017   BUN 11 03/08/2017   CREATININE 1.08 (H) 03/08/2017   CALCIUM 8.2 (L) 03/08/2017   LFT  Recent Labs  03/08/17 1904  PROT 5.8*  ALBUMIN 3.2*  AST 19  ALT 12*  ALKPHOS 59  BILITOT 0.8    Mr Brain Wo Contrast  Result Date: 03/09/2017 CLINICAL DATA:  Stroke, follow-up. Sudden onset of a aphasia and right facial droop. Improvement after tPA administration. EXAM: MRI HEAD WITHOUT CONTRAST MRA HEAD WITHOUT CONTRAST TECHNIQUE: Multiplanar, multiecho pulse sequences of the brain and surrounding structures were obtained without intravenous contrast. Angiographic images of the head were obtained using MRA technique without contrast. COMPARISON:  None. FINDINGS: MRI HEAD FINDINGS Brain: The diffusion-weighted images demonstrate no acute or subacute infarction. No acute hemorrhage or mass lesion is present. Ventricles are proportionate to the degree of atrophy. Periventricular white matter disease is moderately advanced for age. Dilated perivascular spaces are present in the basal ganglia. The brainstem and cerebellum are normal. Vascular: Flow is present in the major intracranial arteries. Skull and upper cervical spine: The skullbase is normal. The craniocervical junction is normal. Midline sagittal structures are unremarkable. Sinuses/Orbits: Mild mucosal thickening is present  in the anterior ethmoid air cells bilaterally. The paranasal sinuses and mastoid air cells are otherwise clear. Bilateral lens replacements  are noted. The globes and orbits are otherwise within normal limits. MRA HEAD FINDINGS The internal carotid artery is are within normal limits the high cervical segments through the ICA termini bilaterally. There is mild narrowing at the origin and midportion of the left A1 segment. An azygos A2 segment is noted. MCA bifurcations are intact bilaterally. There is mild small vessel attenuation within the MCA branches bilaterally without a significant proximal stenosis or occlusion. The left vertebral artery is the dominant vessel. The left PICA origin is visualized and normal. The right PICA scratched at the right AICA is normal. There is mild irregularity of the basilar artery. The right posterior cerebral artery originates from basilar tip with contribution from the right posterior communicating artery. The left posterior cerebral artery is of fetal type. There is some attenuation of distal PCA branch vessels without a significant proximal stenosis or occlusion. IMPRESSION: 1. No acute or subacute infarct. 2. Atrophy and white matter changes are moderately advanced for age. This likely reflects the sequela of chronic microvascular ischemia. 3. MRA circle Willis demonstrates mild diffuse small vessel disease without significant proximal stenosis, aneurysm, or branch vessel occlusion within the circle of Willis. Electronically Signed   By: San Morelle M.D.   On: 03/09/2017 12:58   Dg Chest Port 1 View  Result Date: 03/08/2017 CLINICAL DATA:  Status post CVA, acute onset.  Initial encounter. EXAM: PORTABLE CHEST 1 VIEW COMPARISON:  None. FINDINGS: The lungs are well-aerated and clear. There is no evidence of focal opacification, pleural effusion or pneumothorax. The cardiomediastinal silhouette is within normal limits. Prominence of the right paratracheal soft tissues is thought to reflect normal vasculature. No acute osseous abnormalities are seen. IMPRESSION: No acute cardiopulmonary process seen.  Electronically Signed   By: Garald Balding M.D.   On: 03/08/2017 23:36   Mr Jodene Nam Head Wo Contrast  Result Date: 03/09/2017 CLINICAL DATA:  Stroke, follow-up. Sudden onset of a aphasia and right facial droop. Improvement after tPA administration. EXAM: MRI HEAD WITHOUT CONTRAST MRA HEAD WITHOUT CONTRAST TECHNIQUE: Multiplanar, multiecho pulse sequences of the brain and surrounding structures were obtained without intravenous contrast. Angiographic images of the head were obtained using MRA technique without contrast. COMPARISON:  None. FINDINGS: MRI HEAD FINDINGS Brain: The diffusion-weighted images demonstrate no acute or subacute infarction. No acute hemorrhage or mass lesion is present. Ventricles are proportionate to the degree of atrophy. Periventricular white matter disease is moderately advanced for age. Dilated perivascular spaces are present in the basal ganglia. The brainstem and cerebellum are normal. Vascular: Flow is present in the major intracranial arteries. Skull and upper cervical spine: The skullbase is normal. The craniocervical junction is normal. Midline sagittal structures are unremarkable. Sinuses/Orbits: Mild mucosal thickening is present in the anterior ethmoid air cells bilaterally. The paranasal sinuses and mastoid air cells are otherwise clear. Bilateral lens replacements are noted. The globes and orbits are otherwise within normal limits. MRA HEAD FINDINGS The internal carotid artery is are within normal limits the high cervical segments through the ICA termini bilaterally. There is mild narrowing at the origin and midportion of the left A1 segment. An azygos A2 segment is noted. MCA bifurcations are intact bilaterally. There is mild small vessel attenuation within the MCA branches bilaterally without a significant proximal stenosis or occlusion. The left vertebral artery is the dominant vessel. The left PICA origin is visualized and normal.  The right PICA scratched at the right AICA  is normal. There is mild irregularity of the basilar artery. The right posterior cerebral artery originates from basilar tip with contribution from the right posterior communicating artery. The left posterior cerebral artery is of fetal type. There is some attenuation of distal PCA branch vessels without a significant proximal stenosis or occlusion. IMPRESSION: 1. No acute or subacute infarct. 2. Atrophy and white matter changes are moderately advanced for age. This likely reflects the sequela of chronic microvascular ischemia. 3. MRA circle Willis demonstrates mild diffuse small vessel disease without significant proximal stenosis, aneurysm, or branch vessel occlusion within the circle of Willis. Electronically Signed   By: San Morelle M.D.   On: 03/09/2017 12:58      IMPRESSION:   *  History FOBT+ normocytic anemia.  Developed hyponatremia and dehydration with her colonoscopy So unable to undergo colonoscopy which was scheduled for this week Current hemoglobin reflects 2 gm increased over the last 6 weeks.  *Intermittent hyponatremia.  Admitted with sodium of 119 and associated weakness on 10/21.  Family seems to feel that the clear liquid diet may have had something to do with her recurrent drop in sodium.  Sodium improved to 129.  *  TIA, symptoms resolved following treatment with TPA. Neurology is recommending daily aspirin Determined to have PAF on monitor within the last several weeks.  Not on anticoagulation. History of TIA in 05/2016.  On Plavix at home.  This had been held for a few days starting last week in anticipation of colonoscopy.  It remains on hold.   PLAN:     *  Decision as to whether to pursue colonoscopy here in [place] will be up to Dr. Carlean Purl.  Family aware of this.  The soonest colonoscopy could be done, should he decide to do so, would be on 10/26.  She can continue regular diet for the time being.  *  Just to make sure that it is normal, will check the  TSH in the morning.  Will also check iron and B12 studies   Azucena Freed  03/09/2017, 1:35 PM Pager: [place] Attending   I have taken an interval history, reviewed the chart and examined the patient. I agree with the Advanced Practitioner's note, impression and recommendations.    Seems reasonable to pursue her endoscopic studies here in the hospital with a better safety net.  She has definite reason to get evaluated since they are contemplating anticoagulation treatment or returning to antiplatelet treatment.  Hopefully we will be able to mitigate hyponatremic by using normal saline infusion.  We will also plan to modify her preprepped diet some, I think she can have a solid breakfast the morning of prep day which would be tomorrow.  Anticipate colonoscopy and EGD on Friday.  Daughter and patient in agreement.  I appreciate the opportunity to care for this patient. CC: Sherry Kiel, MD

## 2017-03-10 ENCOUNTER — Inpatient Hospital Stay (HOSPITAL_COMMUNITY): Payer: PPO

## 2017-03-10 DIAGNOSIS — G459 Transient cerebral ischemic attack, unspecified: Principal | ICD-10-CM

## 2017-03-10 DIAGNOSIS — I361 Nonrheumatic tricuspid (valve) insufficiency: Secondary | ICD-10-CM

## 2017-03-10 LAB — BASIC METABOLIC PANEL
ANION GAP: 8 (ref 5–15)
BUN: 10 mg/dL (ref 6–20)
CALCIUM: 8.4 mg/dL — AB (ref 8.9–10.3)
CO2: 25 mmol/L (ref 22–32)
CREATININE: 1.03 mg/dL — AB (ref 0.44–1.00)
Chloride: 101 mmol/L (ref 101–111)
GFR, EST AFRICAN AMERICAN: 56 mL/min — AB (ref >60)
GFR, EST NON AFRICAN AMERICAN: 49 mL/min — AB (ref >60)
GLUCOSE: 97 mg/dL (ref 65–99)
Potassium: 3.5 mmol/L (ref 3.5–5.1)
Sodium: 134 mmol/L — ABNORMAL LOW (ref 135–145)

## 2017-03-10 LAB — FOLATE: FOLATE: 29 ng/mL (ref >5.9)

## 2017-03-10 LAB — FERRITIN: FERRITIN: 132 ng/mL (ref 11–307)

## 2017-03-10 LAB — CBC
HCT: 36.5 % (ref 36.0–46.0)
Hemoglobin: 12 g/dL (ref 12.0–15.0)
MCH: 26 pg (ref 26.0–34.0)
MCHC: 32.9 g/dL (ref 30.0–36.0)
MCV: 79.2 fL (ref 78.0–100.0)
Platelets: 155 10*3/uL (ref 150–400)
RBC: 4.61 MIL/uL (ref 3.87–5.11)
RDW: 16.5 % — AB (ref 11.5–15.5)
WBC: 3.9 10*3/uL — AB (ref 4.0–10.5)

## 2017-03-10 LAB — RETICULOCYTES
RBC.: 4.61 MIL/uL (ref 3.87–5.11)
RETIC COUNT ABSOLUTE: 23.1 10*3/uL (ref 19.0–186.0)
Retic Ct Pct: 0.5 % (ref 0.4–3.1)

## 2017-03-10 LAB — TSH: TSH: 2.596 u[IU]/mL (ref 0.350–4.500)

## 2017-03-10 LAB — ECHOCARDIOGRAM COMPLETE
Height: 63.5 in
Weight: 2102.31 oz

## 2017-03-10 LAB — IRON AND TIBC
IRON: 20 ug/dL — AB (ref 28–170)
Saturation Ratios: 7 % — ABNORMAL LOW (ref 10.4–31.8)
TIBC: 301 ug/dL (ref 250–450)
UIBC: 281 ug/dL

## 2017-03-10 LAB — VITAMIN B12: Vitamin B-12: 766 pg/mL (ref 180–914)

## 2017-03-10 LAB — GLUCOSE, CAPILLARY: Glucose-Capillary: 104 mg/dL — ABNORMAL HIGH (ref 65–99)

## 2017-03-10 MED ORDER — PEG-KCL-NACL-NASULF-NA ASC-C 100 G PO SOLR
0.5000 | Freq: Once | ORAL | Status: AC
  Administered 2017-03-11: 100 g via ORAL
  Filled 2017-03-10: qty 1

## 2017-03-10 MED ORDER — BISACODYL 5 MG PO TBEC
5.0000 mg | DELAYED_RELEASE_TABLET | Freq: Once | ORAL | Status: AC
  Administered 2017-03-10: 5 mg via ORAL
  Filled 2017-03-10: qty 1

## 2017-03-10 MED ORDER — PEG-KCL-NACL-NASULF-NA ASC-C 100 G PO SOLR
0.5000 | Freq: Once | ORAL | Status: AC
  Administered 2017-03-10: 100 g via ORAL
  Filled 2017-03-10 (×2): qty 1

## 2017-03-10 MED ORDER — PEG-KCL-NACL-NASULF-NA ASC-C 100 G PO SOLR: 1.0000 | Freq: Once | ORAL | Status: DC

## 2017-03-10 NOTE — Progress Notes (Signed)
     [facility] Progress Note  Chief Complaint:   Anemia and FOBT +  Subjective: Feels okay, on clears.   Objective:  Vital signs in last 24 hours: Temp:  [98.1 F (36.7 C)-98.6 F (37 C)] 98.2 F (36.8 C) (10/25 0800) Pulse Rate:  [46-85] 85 (10/25 0800) Resp:  [14-33] 14 (10/25 0800) BP: (149-205)/(53-168) 152/74 (10/25 0800) SpO2:  [95 %-99 %] 98 % (10/25 0800) Last BM Date: 03/08/17 General:   Alert, well-developed, white female in NAD EENT:  Normal hearing, non icteric sclera, conjunctive pink.  Heart:  Regular rate, irreg rhythm, no lower extremity edema Pulm: Normal respiratory effort, lungs CTA bilaterally without wheezes or crackles. Abdomen:  Soft, nondistended, nontender.  Normal bowel sounds Neurologic:  Alert and  oriented x4;  grossly normal neurologically. Psych:  Pleasant, cooperative.  Normal mood and affect   Lab Results:  Recent Labs  03/08/17 1904 03/10/17 0356  WBC 7.4 3.9*  HGB 12.5 12.0  HCT 37.0 36.5  PLT 160 155   BMET  Recent Labs  03/08/17 1904 03/10/17 0356  NA 129* 134*  K 3.5 3.5  CL 100* 101  CO2 20* 25  GLUCOSE 114* 97  BUN 11 10  CREATININE 1.08* 1.03*  CALCIUM 8.2* 8.4*   LFT  Recent Labs  03/08/17 1904  PROT 5.8*  ALBUMIN 3.2*  AST 19  ALT 12*  ALKPHOS 65  BILITOT 0.8    ASSESSMENT / PLAN:   1. 83 yo female with FOBT+ stool and normocytic anemia.  -she is scheduled for EGD / colonoscopy in the am.  -stopping SQ heparin after 2 pm dose today.   2. TIA. Recent development of PAF. Takes plavix at home (on hold for colonoscopy she was already scheduled to have last week).   3. Hyponatremia, improving. Na+ up to 134 today.    LOS: 2 days   Tye Savoy ,NP 03/10/2017, 9:48 AM  Pager number [phone]  Agree with Ms. Vanita Ingles assessment and plan. Gatha Mayer, MD, [facility]

## 2017-03-10 NOTE — Anesthesia Preprocedure Evaluation (Addendum)
Anesthesia Evaluation  Patient identified by MRN, date of birth, ID band Patient awake    Reviewed: Allergy & Precautions, NPO status , Patient's Chart, lab work & pertinent test results  History of Anesthesia Complications Negative for: history of anesthetic complications  Airway Mallampati: II  TM Distance: >3 FB Neck ROM: Full    Dental  (+) Partial Lower, Partial Upper   Pulmonary neg pulmonary ROS,    Pulmonary exam normal        Cardiovascular hypertension, Pt. on home beta blockers Normal cardiovascular exam+ dysrhythmias Atrial Fibrillation   Impressions:  - Pt in atrial fibrillation during the study; normal LV systolic   function; trace AI; mild LAE; mild TR with mildly elevated   pulmonary pressure.    Neuro/Psych Anxiety TIACVA    GI/Hepatic Neg liver ROS,   Endo/Other  negative endocrine ROS  Renal/GU negative Renal ROS     Musculoskeletal negative musculoskeletal ROS (+)   Abdominal   Peds  Hematology negative hematology ROS (+)   Anesthesia Other Findings Day of surgery medications reviewed with the patient.  Reproductive/Obstetrics                            Anesthesia Physical Anesthesia Plan  ASA: III  Anesthesia Plan: MAC   Post-op Pain Management:    Induction:   PONV Risk Score and Plan: 2 and Ondansetron and Propofol infusion  Airway Management Planned: Natural Airway  Additional Equipment:   Intra-op Plan:   Post-operative Plan:   Informed Consent: I have reviewed the patients History and Physical, chart, labs and discussed the procedure including the risks, benefits and alternatives for the proposed anesthesia with the patient or authorized representative who has indicated his/her understanding and acceptance.   Dental advisory given  Plan Discussed with: CRNA and Anesthesiologist  Anesthesia Plan Comments:        Anesthesia Quick  Evaluation

## 2017-03-10 NOTE — Progress Notes (Signed)
Pt received at this time 16:53pm with son at bedside from [place]. Pt stable, neuro intact. Telemetry monitoring. Pt oriented to room. Safety measures in place. Call bell within reach. Pt states that she is anxious about procedure tomorrow. Encouraged relaxation and breathing exercises. Will continue to monitor.

## 2017-03-10 NOTE — Progress Notes (Signed)
Physical Therapy Treatment Patient Details Name: Sherry Hess MRN: [id number] DOB: 07/06/1930 Today's Date: 03/10/2017    History of Present Illness Pt is an 83 y.o. female who was admitted to [facility] 10/21 with generalized weakness due to hyponatremia. She had decreased PO intake, and then was scheduled for colonoscopy due to hemoccult positive stool. She did the prep and was weak. At [place], her Na was found to be 117 and she was admitted for IV hydration. She improved and Then while her physician Gevena Barre) was discharging her, she developed sudden onset aphasia, and right facial droop. She then started speaking, but was severely dysarthric. She was evaluated by tele-neurology who administered tPA. Following tPA, she markedly improved and is now back to baseline. MRI negative for acute infarction. PMH significant for anxiety and hypertension.     PT Comments    Pt is progressing well with gait and mobility.  Gait training with a cane initiated and needs more practice.  Stair training also initiated.  Pt remains appropriate for OP PT f/u at discharge.     Follow Up Recommendations  Outpatient PT     Equipment Recommendations  None recommended by PT    Recommendations for Other Services   NA     Precautions / Restrictions Precautions Precautions: Fall Precaution Comments: slightly unsteady on feet    Mobility  Bed Mobility Overal bed mobility: Needs Assistance Bed Mobility: Supine to Sit     Supine to sit: Supervision     General bed mobility comments: Supervision for safety due to pt with posterior bias as she moved to EOB due to deflated compliant mattress.   Transfers Overall transfer level: Needs assistance Equipment used: Straight cane Transfers: Sit to/from Stand Sit to Stand: Supervision         General transfer comment: Supervision for safety  Ambulation/Gait Ambulation/Gait assistance: Min guard Ambulation Distance (Feet): 130  Feet Assistive device: Straight cane Gait Pattern/deviations: Step-through pattern;Drifts right/left;Staggering right     General Gait Details: Pt continues to drift and stagger more towards the right than the left and needed verbal cues for safe cane use (she liked to pick the cane up and take a few steps).     Stairs Stairs: Yes   Stair Management: No rails;Step to pattern;Forwards;With cane Number of Stairs: 4 General stair comments: Pt able to do stairs with cane min guard assist for balance and step to pattern,       Modified Rankin (Stroke Patients Only) Modified Rankin (Stroke Patients Only) Pre-Morbid Rankin Score: No significant disability Modified Rankin: Moderately severe disability     Balance Overall balance assessment: Needs assistance Sitting-balance support: No upper extremity supported;Feet supported Sitting balance-Leahy Scale: Good     Standing balance support: No upper extremity supported;Single extremity supported;During functional activity Standing balance-Leahy Scale: Fair                              Cognition Arousal/Alertness: Awake/alert Behavior During Therapy: WFL for tasks assessed/performed Overall Cognitive Status: Within Functional Limits for tasks assessed                                               Pertinent Vitals/Pain Pain Assessment: No/denies pain           PT Goals (current goals can now  be found in the care plan section) Acute Rehab PT Goals Patient Stated Goal: to go home Progress towards PT goals: Progressing toward goals    Frequency    Min 4X/week      PT Plan Current plan remains appropriate       AM-PAC PT "6 Clicks" Daily Activity  Outcome Measure  Difficulty turning over in bed (including adjusting bedclothes, sheets and blankets)?: None Difficulty moving from lying on back to sitting on the side of the bed? : None Difficulty sitting down on and standing up from a  chair with arms (e.g., wheelchair, bedside commode, etc,.)?: Unable Help needed moving to and from a bed to chair (including a wheelchair)?: A Little Help needed walking in hospital room?: A Little Help needed climbing 3-5 steps with a railing? : A Little 6 Click Score: 18    End of Session Equipment Utilized During Treatment: Gait belt Activity Tolerance: Patient tolerated treatment well Patient left: in chair;with call bell/phone within reach;with family/visitor present   PT Visit Diagnosis: Unsteadiness on feet (R26.81);Difficulty in walking, not elsewhere classified (R26.2);Other symptoms and signs involving the nervous system (S97.530)     Time: 0511-0211 PT Time Calculation (min) (ACUTE ONLY): 19 min  Charges:  $Gait Training: 8-22 mins          Tyeisha Dinan B. Georgana Romain, PT, DPT [phone]            03/10/2017, 11:14 PM

## 2017-03-10 NOTE — Progress Notes (Signed)
  Echocardiogram 2D Echocardiogram has been performed.  Jannett Celestine 03/10/2017, 11:58 AM

## 2017-03-10 NOTE — Progress Notes (Addendum)
STROKE TEAM PROGRESS NOTE   SUBJECTIVE (INTERVAL HISTORY) No family is at the bedside. Pt is sitting in chair for breakfast, seems in good appetite. She admitted that she feels normal now. No acute event overnight. MRI negative for stroke. Pending EGD and colonoscopy in am.    OBJECTIVE Temp:  [98.1 F (36.7 C)-98.6 F (37 C)] 98.2 F (36.8 C) (10/25 0800) Pulse Rate:  [46-147] 147 (10/25 1002) Cardiac Rhythm: Normal sinus rhythm;Sinus bradycardia (10/25 0800) Resp:  [14-33] 23 (10/25 1002) BP: (149-205)/(53-168) 158/122 (10/25 1002) SpO2:  [95 %-99 %] 98 % (10/25 1002)  No results for input(s): GLUCAP in the last 168 hours.  Recent Labs Lab 03/08/17 1904 03/10/17 0356  NA 129* 134*  K 3.5 3.5  CL 100* 101  CO2 20* 25  GLUCOSE 114* 97  BUN 11 10  CREATININE 1.08* 1.03*  CALCIUM 8.2* 8.4*    Recent Labs Lab 03/08/17 1904  AST 19  ALT 12*  ALKPHOS 59  BILITOT 0.8  PROT 5.8*  ALBUMIN 3.2*    Recent Labs Lab 03/08/17 1904 03/10/17 0356  WBC 7.4 3.9*  HGB 12.5 12.0  HCT 37.0 36.5  MCV 78.4 79.2  PLT 160 155   No results for input(s): CKTOTAL, CKMB, CKMBINDEX, TROPONINI in the last 168 hours. No results for input(s): LABPROT, INR in the last 72 hours. No results for input(s): COLORURINE, LABSPEC, [place], GLUCOSEU, HGBUR, BILIRUBINUR, KETONESUR, PROTEINUR, UROBILINOGEN, NITRITE, LEUKOCYTESUR in the last 72 hours.  Invalid input(s): APPERANCEUR     Component Value Date/Time   CHOL 117 03/09/2017 0226   TRIG 50 03/09/2017 0226   HDL 36 (L) 03/09/2017 0226   CHOLHDL 3.3 03/09/2017 0226   VLDL 10 03/09/2017 0226   LDLCALC 71 03/09/2017 0226   Lab Results  Component Value Date   HGBA1C 5.2 03/09/2017   No results found for: LABOPIA, COCAINSCRNUR, LABBENZ, AMPHETMU, THCU, LABBARB  No results for input(s): ETH in the last 168 hours.  I have personally reviewed the radiological images below and agree with the radiology interpretations.  Mri and Mra Brain  Wo Contrast 03/09/2017 IMPRESSION: 1. No acute or subacute infarct. 2. Atrophy and white matter changes are moderately advanced for age. This likely reflects the sequela of chronic microvascular ischemia. 3. MRA circle Willis demonstrates mild diffuse small vessel disease without significant proximal stenosis, aneurysm, or branch vessel occlusion within the circle of Willis.   EEG normal EEG  CUS - Bilateral: 1-39% ICA stenosis. Vertebral artery flow is antegrade.  2D Echocardiogram  pending   PHYSICAL EXAM  Temp:  [98.1 F (36.7 C)-98.6 F (37 C)] 98.2 F (36.8 C) (10/25 0800) Pulse Rate:  [46-147] 147 (10/25 1002) Resp:  [14-33] 23 (10/25 1002) BP: (149-205)/(53-168) 158/122 (10/25 1002) SpO2:  [95 %-99 %] 98 % (10/25 1002)  General - Well nourished, well developed, in no apparent distress.  Ophthalmologic - fundi not visualized due to noncooperation.  Cardiovascular - Regular rate and rhythm with no murmur, not in afib.  Mental Status -  Level of arousal and orientation to time, place, and person were intact. Language including expression, naming, repetition, comprehension was assessed and found intact. Fund of Knowledge was assessed and was impaired.  Cranial Nerves II - XII - II - Visual field intact OU. III, IV, VI - Extraocular movements intact. V - Facial sensation intact bilaterally. VII - Facial movement intact bilaterally. VIII - Hearing & vestibular intact bilaterally. X - Palate elevates symmetrically. XI - Chin turning &  shoulder shrug intact bilaterally. XII - Tongue protrusion intact.  Motor Strength - [name] strength was normal in all extremities and pronator drift was absent.  Bulk was normal and fasciculations were absent.   Motor Tone - Muscle tone was assessed at the neck and appendages and was normal.  Reflexes - [name] reflexes were symmetrical in all extremities and she had no pathological reflexes.  Sensory - Light touch,  temperature/pinprick were assessed and were symmetrical.    Coordination - The patient had normal movements in the hands with no ataxia or dysmetria.  Tremor was absent.  Gait and Station - deferred.   ASSESSMENT/PLAN [name] is a 83 y.o. female with history of HTN, HLD, recent diagnosis of afib, positive stool guaiac, TIA admitted for sudden onset aphasia and right facial droop. tPA given at [facility].    TIA:  left brain cortical TIA, embolic pattern likely secondary to afib not on AC  Resultant back to baseline  MRI no acute stroke   MRA unremarkable   Carotid Doppler unremarkable  2D Echo  Pending   EEG negative  LDL 71  HgbA1c 5.2  subq heparin for VTE prophylaxis Diet clear liquid Room service appropriate? Yes; Fluid consistency: Thin  Diet NPO time specified   clopidogrel 75 mg daily prior to admission, now on ASA 325mg . Consider eliquis for stroke prevention once cleared by GI.   Patient counseled to be compliant with her antithrombotic medications  Ongoing aggressive stroke risk factor management  Therapy recommendations:  pending  Disposition:  Pending  Hx of TIA  05/2016 left hand and left face numbness lasting 5 min - CT neg - ASA changed to DAPT and on lipitor  08/2016 LDL 110 and CUS neg  11/2016 - EF 65-70% - cardiac monitoring showed PAF - not on AC due to stool guaiac positive - scheduled for colonoscopy but not done yet  03/06/17 - admitted to [facility] for malaise, and low Na at 117 - improved  PAF  Found on cardiac monitoring   Not on AC due to stool guaiac positive  On plavix PTA  Currently rate controlled on coreg  Has outpt cardiology follow up   Consider eliquis for stroke prevention once cleared by GI.   Positive stool occult blood  Found to have anemia   Stool guaiac positive at OSH  Colonoscopy scheduled but not able to do due to hospitalization  Decreased appetite but no abdominal pain -  improving  appreciate GI recs  EGD and colonoscopy pending  Hyponatremia  As low as 117 at [facility]  On this admission Na 129  Today 134  EEG normal  Symptoms much improved  Continue IVF @ 40  Hypertension Stable at high side Permissive hypertension (OK if <180/105) for 24-48 hours post stroke and then gradually normalized within 5-7 days.  Long term BP goal normotensive  On coreg for rate control  Hyperlipidemia  Home meds:  lipitor 20   LDL 71, goal < 70  Now lipitor resumed  Continue statin at discharge  Other Stroke Risk Factors  Advanced age  Other Active Problems  Anemia - acute blood loss - Hb 12.5 -> 12.0  Elevated Cre - Cre 1.08 -> 1.03  Hospital day # 2   Rosalin Hawking, MD PhD Stroke Neurology 03/10/2017 10:59 AM    To contact Stroke Continuity provider, please refer to [email or web address] After hours, contact General Neurology

## 2017-03-10 NOTE — Care Management Note (Signed)
Case Management Note  Patient Details  Name: Rhia Blatchford MRN: [id number] Date of Birth: 1931/04/15  Subjective/Objective:    Pt admitted on 03/08/17 s/p Lt brain cortical TIA, s/p TPA.   PTA, pt resided at home with adult children (son).                Action/Plan: PT recommending OP PT; OT recommending 3 in 1 BSC, and no OP follow up.  Will continue to follow progress.    Expected Discharge Date:                  Expected Discharge Plan:  OP Rehab  In-House Referral:     Discharge planning Services  CM Consult  Post Acute Care Choice:    Choice offered to:     DME Arranged:    DME Agency:     HH Arranged:    HH Agency:     Status of Service:  In process, will continue to follow  If discussed at Long Length of Stay Meetings, dates discussed:    Additional Comments:  Reinaldo Raddle, RN, BSN  Trauma/Neuro ICU Case Manager [phone]

## 2017-03-11 ENCOUNTER — Encounter (HOSPITAL_COMMUNITY): Payer: Self-pay | Admitting: Certified Registered Nurse Anesthetist

## 2017-03-11 ENCOUNTER — Inpatient Hospital Stay (HOSPITAL_COMMUNITY): Payer: PPO | Admitting: Anesthesiology

## 2017-03-11 ENCOUNTER — Encounter (HOSPITAL_COMMUNITY)
Admission: AD | Disposition: A | Discharge status: Home / Self Care | Payer: Self-pay | Source: Other Acute Inpatient Hospital | Attending: Neurology

## 2017-03-11 DIAGNOSIS — K648 Other hemorrhoids: Secondary | ICD-10-CM

## 2017-03-11 DIAGNOSIS — D128 Benign neoplasm of rectum: Secondary | ICD-10-CM

## 2017-03-11 DIAGNOSIS — K644 Residual hemorrhoidal skin tags: Secondary | ICD-10-CM

## 2017-03-11 DIAGNOSIS — K5730 Diverticulosis of large intestine without perforation or abscess without bleeding: Secondary | ICD-10-CM

## 2017-03-11 DIAGNOSIS — K621 Rectal polyp: Secondary | ICD-10-CM

## 2017-03-11 DIAGNOSIS — D125 Benign neoplasm of sigmoid colon: Secondary | ICD-10-CM

## 2017-03-11 HISTORY — PX: ESOPHAGOGASTRODUODENOSCOPY: SHX5428

## 2017-03-11 HISTORY — PX: COLONOSCOPY: SHX5424

## 2017-03-11 LAB — BASIC METABOLIC PANEL
ANION GAP: 9 (ref 5–15)
BUN: 10 mg/dL (ref 6–20)
CO2: 21 mmol/L — ABNORMAL LOW (ref 22–32)
Calcium: 8.6 mg/dL — ABNORMAL LOW (ref 8.9–10.3)
Chloride: 101 mmol/L (ref 101–111)
Creatinine, Ser: 1 mg/dL (ref 0.44–1.00)
GFR, EST AFRICAN AMERICAN: 58 mL/min — AB (ref >60)
GFR, EST NON AFRICAN AMERICAN: 50 mL/min — AB (ref >60)
GLUCOSE: 97 mg/dL (ref 65–99)
POTASSIUM: 3.6 mmol/L (ref 3.5–5.1)
Sodium: 131 mmol/L — ABNORMAL LOW (ref 135–145)

## 2017-03-11 LAB — CBC
HEMATOCRIT: 35.8 % — AB (ref 36.0–46.0)
Hemoglobin: 11.7 g/dL — ABNORMAL LOW (ref 12.0–15.0)
MCH: 25.7 pg — ABNORMAL LOW (ref 26.0–34.0)
MCHC: 32.7 g/dL (ref 30.0–36.0)
MCV: 78.5 fL (ref 78.0–100.0)
PLATELETS: 132 10*3/uL — AB (ref 150–400)
RBC: 4.56 MIL/uL (ref 3.87–5.11)
RDW: 16.4 % — ABNORMAL HIGH (ref 11.5–15.5)
WBC: 4.1 10*3/uL (ref 4.0–10.5)

## 2017-03-11 LAB — CEA: CEA: 2.4 ng/mL (ref 0.0–4.7)

## 2017-03-11 SURGERY — EGD (ESOPHAGOGASTRODUODENOSCOPY)
Anesthesia: Monitor Anesthesia Care

## 2017-03-11 MED ORDER — IRBESARTAN 150 MG PO TABS
150.0000 mg | ORAL_TABLET | Freq: Every day | ORAL | Status: DC
  Administered 2017-03-11 – 2017-03-12 (×2): 150 mg via ORAL
  Filled 2017-03-11 (×2): qty 1

## 2017-03-11 MED ORDER — LACTATED RINGERS IV SOLN
INTRAVENOUS | Status: DC | PRN
  Administered 2017-03-11: 08:00:00 via INTRAVENOUS

## 2017-03-11 MED ORDER — APIXABAN 2.5 MG PO TABS
2.5000 mg | ORAL_TABLET | Freq: Two times a day (BID) | ORAL | Status: DC
  Administered 2017-03-12: 2.5 mg via ORAL
  Filled 2017-03-11 (×3): qty 1

## 2017-03-11 MED ORDER — LIDOCAINE HCL (CARDIAC) 20 MG/ML IV SOLN
INTRAVENOUS | Status: DC | PRN
  Administered 2017-03-11: 60 mg via INTRATRACHEAL

## 2017-03-11 MED ORDER — PROPOFOL 500 MG/50ML IV EMUL
INTRAVENOUS | Status: DC | PRN
  Administered 2017-03-11: 65 ug/kg/min via INTRAVENOUS

## 2017-03-11 MED ORDER — PROPOFOL 10 MG/ML IV BOLUS
INTRAVENOUS | Status: DC | PRN
  Administered 2017-03-11: 20 mg via INTRAVENOUS

## 2017-03-11 MED ORDER — HYDRALAZINE HCL 25 MG PO TABS
12.5000 mg | ORAL_TABLET | Freq: Three times a day (TID) | ORAL | Status: DC
  Administered 2017-03-11 – 2017-03-12 (×2): 12.5 mg via ORAL
  Filled 2017-03-11 (×2): qty 1

## 2017-03-11 NOTE — H&P (View-Only) (Signed)
     [facility] Progress Note  Chief Complaint:   Anemia and FOBT +  Subjective: Feels okay, on clears.   Objective:  Vital signs in last 24 hours: Temp:  [98.1 F (36.7 C)-98.6 F (37 C)] 98.2 F (36.8 C) (10/25 0800) Pulse Rate:  [46-85] 85 (10/25 0800) Resp:  [14-33] 14 (10/25 0800) BP: (149-205)/(53-168) 152/74 (10/25 0800) SpO2:  [95 %-99 %] 98 % (10/25 0800) Last BM Date: 03/08/17 General:   Alert, well-developed, white female in NAD EENT:  Normal hearing, non icteric sclera, conjunctive pink.  Heart:  Regular rate, irreg rhythm, no lower extremity edema Pulm: Normal respiratory effort, lungs CTA bilaterally without wheezes or crackles. Abdomen:  Soft, nondistended, nontender.  Normal bowel sounds Neurologic:  Alert and  oriented x4;  grossly normal neurologically. Psych:  Pleasant, cooperative.  Normal mood and affect   Lab Results:  Recent Labs  03/08/17 1904 03/10/17 0356  WBC 7.4 3.9*  HGB 12.5 12.0  HCT 37.0 36.5  PLT 160 155   BMET  Recent Labs  03/08/17 1904 03/10/17 0356  NA 129* 134*  K 3.5 3.5  CL 100* 101  CO2 20* 25  GLUCOSE 114* 97  BUN 11 10  CREATININE 1.08* 1.03*  CALCIUM 8.2* 8.4*   LFT  Recent Labs  03/08/17 1904  PROT 5.8*  ALBUMIN 3.2*  AST 19  ALT 12*  ALKPHOS 48  BILITOT 0.8    ASSESSMENT / PLAN:   1. 83 yo female with FOBT+ stool and normocytic anemia.  -she is scheduled for EGD / colonoscopy in the am.  -stopping SQ heparin after 2 pm dose today.   2. TIA. Recent development of PAF. Takes plavix at home (on hold for colonoscopy she was already scheduled to have last week).   3. Hyponatremia, improving. Na+ up to 134 today.    LOS: 2 days   Tye Savoy ,NP 03/10/2017, 9:48 AM  Pager number [phone]  Agree with Ms. Vanita Ingles assessment and plan. Gatha Mayer, MD, [facility]

## 2017-03-11 NOTE — Op Note (Signed)
[facility] Patient Name: Sherry Hess Procedure Date : 03/11/2017 MRN: [id number] Attending MD: Gatha Mayer , MD Date of Birth: 1930/12/08 CSN: [id number] Age: 83 Admit Type: Inpatient Procedure:                Colonoscopy Indications:              Heme positive stool Providers:                Gatha Mayer, MD, Cleda Daub, RN, Tinnie Gens, Technician, Rejeana Brock, CRNA Referring MD:              Medicines:                Propofol per Anesthesia, Monitored Anesthesia Care Complications:            No immediate complications. Estimated Blood Loss:     Estimated blood loss was minimal. Procedure:                Pre-Anesthesia Assessment:                           - Prior to the procedure, a History and Physical                            was performed, and patient medications and                            allergies were reviewed. [name] tolerance of                            previous anesthesia was also reviewed. The risks                            and benefits of the procedure and the sedation                            options and risks were discussed with the patient.                            All questions were answered, and informed consent                            was obtained. Prior Anticoagulants: The patient                            last took Plavix (clopidogrel) 7 days prior to the                            procedure. ASA Grade Assessment: III - A patient                            with severe systemic disease. After reviewing the  risks and benefits, the patient was deemed in                            satisfactory condition to undergo the procedure.                           After obtaining informed consent, the colonoscope                            was passed under direct vision. Throughout the                            procedure, the patient's blood pressure, pulse, and                      oxygen saturations were monitored continuously. The                            EC-[id number] ([id number]) scope was introduced through                            the anus and advanced to the the cecum, identified                            by appendiceal orifice and ileocecal valve. The                            colonoscopy was performed without difficulty. The                            patient tolerated the procedure well. The quality                            of the bowel preparation was excellent. The                            ileocecal valve, appendiceal orifice, and rectum                            were photographed. Scope In: 8:36:09 AM Scope Out: 8:52:49 AM Scope Withdrawal Time: 0 hours 9 minutes 5 seconds  Total Procedure Duration: 0 hours 16 minutes 40 seconds  Findings:      The perianal exam findings include non-thrombosed external hemorrhoids.      Two sessile polyps were found in the rectum and sigmoid colon. The       polyps were diminutive in size. These polyps were removed with a cold       snare. Resection and retrieval were complete. Verification of patient       identification for the specimen was done. Estimated blood loss was       minimal.      Multiple small and large-mouthed diverticula were found in the sigmoid       colon.      External and internal hemorrhoids were found during retroflexion.      The exam was otherwise without abnormality on direct and retroflexion  views. Impression:               - Non-thrombosed external hemorrhoids found on                            perianal exam.                           - Two diminutive polyps in the rectum and in the                            sigmoid colon, removed with a cold snare. Resected                            and retrieved.                           - Diverticulosis in the sigmoid colon.                           - External and internal hemorrhoids.                           - The  examination was otherwise normal on direct                            and retroflexion views. Moderate Sedation:      Please see anesthesia notes, moderate sedation not given Recommendation:           - Resume previous diet.                           - Continue present medications.                           - Resume Plavix (clopidogrel) at prior dose                            tomorrow.                           - May resume or start anticoagulant agents tomorrow                            - plavix vs Xa inhibitor etc                           - No repeat colonoscopy due to age. Procedure Code(s):        --- Professional ---                           [phone], Colonoscopy, flexible; with removal of                            tumor(s), polyp(s), or other lesion(s) by snare  technique Diagnosis Code(s):        --- Professional ---                           K62.1, Rectal polyp                           D12.5, Benign neoplasm of sigmoid colon                           K64.4, Residual hemorrhoidal skin tags                           K64.8, Other hemorrhoids                           R19.5, Other fecal abnormalities                           K57.30, Diverticulosis of large intestine without                            perforation or abscess without bleeding CPT copyright 2016 American Medical Association. All rights reserved. The codes documented in this report are preliminary and upon coder review may  be revised to meet current compliance requirements. Gatha Mayer, MD 03/11/2017 9:10:08 AM This report has been signed electronically. Number of Addenda: 0

## 2017-03-11 NOTE — Consult Note (Signed)
[facility] CM Primary Care Navigator  03/11/2017  Sherry Hess Aug 27, 1930 [id number]   Met with patient at the bedside to identify possible discharge needs. Patient reports feeling "weakness and numbness to face" that had led to this admission.  Patient endorses Dr. Ernestene Hess or nurse practitioner with [facility] as her primary care provider.   Patient shared using [facility]- pharmacy in [place] to obtain medications without any problem.   Patient states managing her own medications at home using "pill box" system filled weekly.   According to patient, she was driving within [facility] prior to admission but son Sherry Hess) has been providing transportation to her doctors' appointments outside [place].  Patient reports that she lives with her other son Sherry Hess). Her children are her primary caregivers at home as stated.   Anticipated discharge plan is home per patient with children to assist with care needs. Patient was recommended for outpatient therapy but she is currently refusing it per Inpatient CM note.  Patient voiced understanding to call primary care provider's office for a post discharge follow-up appointment within a week or sooner if needs arise. Patient letter (with PCP's contact number) was provided as a reminder.  Explained to patient about [facility] CM services available for health management at home but she denies any needs or concerns at this time. Patient voiced understanding to seek referral from primary care provider to [facility] care management if necessary and appropriate for services in the future.  [facility] care management information provided for future needs that may arise.  Patient however, had verbally agreed and opted for EMMI calls to follow-upher recovery at home.  Referral made forEMMI General calls after discharge.   For questions, please contact:  Sherry Hess, BSN, RN- [facility] Primary Care Navigator   Telephone: [phone] [place]

## 2017-03-11 NOTE — Interval H&P Note (Signed)
History and Physical Interval Note:  03/11/2017 8:13 AM  Sherry Hess  has presented today for surgery, with the diagnosis of anemia, FOBT + stool  The various methods of treatment have been discussed with the patient and family. After consideration of risks, benefits and other options for treatment, the patient has consented to  Procedure(s): ESOPHAGOGASTRODUODENOSCOPY (EGD) (N/A) COLONOSCOPY (N/A) as a surgical intervention .  [name] history has been reviewed, patient examined, no change in status, stable for surgery.  I have reviewed the patient's chart and labs.  Questions were answered to the patient's satisfaction.     Silvano Rusk

## 2017-03-11 NOTE — Progress Notes (Signed)
OT Cancellation    03/11/17 0800  OT Visit Information  Last OT Received On 03/11/17  Reason Eval/Treat Not Completed Patient at procedure or test/ unavailable  [facility], OT/L  [phone] 03/11/2017

## 2017-03-11 NOTE — Progress Notes (Signed)
STROKE TEAM PROGRESS NOTE   SUBJECTIVE (INTERVAL HISTORY) Son is at the bedside. Pt is lying in bed, no complains. She had EGD and colonoscopy, only found to have hemorrhoids and 2 polys with resection. OK to start [facility] tomorrow. Will have pharmacy consult on eliquis. Will likely dc tomorrow.    OBJECTIVE Temp:  [97.5 F (36.4 C)-98.5 F (36.9 C)] 97.5 F (36.4 C) (10/26 1100) Pulse Rate:  [55-70] 67 (10/26 1100) Cardiac Rhythm: Normal sinus rhythm (10/25 1900) Resp:  [18-24] 20 (10/26 1100) BP: (142-257)/(55-92) 193/68 (10/26 1100) SpO2:  [94 %-100 %] 97 % (10/26 1100)   Recent Labs Lab 03/10/17 1242  GLUCAP 104*    Recent Labs Lab 03/08/17 1904 03/10/17 0356 03/11/17 0609  NA 129* 134* 131*  K 3.5 3.5 3.6  CL 100* 101 101  CO2 20* 25 21*  GLUCOSE 114* 97 97  BUN 11 10 10   CREATININE 1.08* 1.03* 1.00  CALCIUM 8.2* 8.4* 8.6*    Recent Labs Lab 03/08/17 1904  AST 19  ALT 12*  ALKPHOS 59  BILITOT 0.8  PROT 5.8*  ALBUMIN 3.2*    Recent Labs Lab 03/08/17 1904 03/10/17 0356 03/11/17 0609  WBC 7.4 3.9* 4.1  HGB 12.5 12.0 11.7*  HCT 37.0 36.5 35.8*  MCV 78.4 79.2 78.5  PLT 160 155 132*   No results for input(s): CKTOTAL, CKMB, CKMBINDEX, TROPONINI in the last 168 hours. No results for input(s): LABPROT, INR in the last 72 hours. No results for input(s): COLORURINE, LABSPEC, Leon, GLUCOSEU, HGBUR, BILIRUBINUR, KETONESUR, PROTEINUR, UROBILINOGEN, NITRITE, LEUKOCYTESUR in the last 72 hours.  Invalid input(s): APPERANCEUR     Component Value Date/Time   CHOL 117 03/09/2017 0226   TRIG 50 03/09/2017 0226   HDL 36 (L) 03/09/2017 0226   CHOLHDL 3.3 03/09/2017 0226   VLDL 10 03/09/2017 0226   LDLCALC 71 03/09/2017 0226   Lab Results  Component Value Date   HGBA1C 5.2 03/09/2017   No results found for: LABOPIA, COCAINSCRNUR, LABBENZ, AMPHETMU, THCU, LABBARB  No results for input(s): ETH in the last 168 hours.  I have personally reviewed the  radiological images below and agree with the radiology interpretations.  Mri and Mra Brain Wo Contrast 03/09/2017 IMPRESSION: 1. No acute or subacute infarct. 2. Atrophy and white matter changes are moderately advanced for age. This likely reflects the sequela of chronic microvascular ischemia. 3. MRA circle Willis demonstrates mild diffuse small vessel disease without significant proximal stenosis, aneurysm, or branch vessel occlusion within the circle of Willis.   EEG normal EEG  CUS - Bilateral: 1-39% ICA stenosis. Vertebral artery flow is antegrade.  TEE - Pt in atrial fibrillation during the study; normal LV systolic   function; trace AI; mild LAE; mild TR with mildly elevated   pulmonary pressure.   PHYSICAL EXAM  Temp:  [97.5 F (36.4 C)-98.5 F (36.9 C)] 97.5 F (36.4 C) (10/26 1100) Pulse Rate:  [55-70] 67 (10/26 1100) Resp:  [18-24] 20 (10/26 1100) BP: (142-257)/(55-92) 193/68 (10/26 1100) SpO2:  [94 %-100 %] 97 % (10/26 1100)  General - Well nourished, well developed, in no apparent distress.  Ophthalmologic - fundi not visualized due to noncooperation.  Cardiovascular - Regular rate and rhythm with no murmur, not in afib.  Mental Status -  Level of arousal and orientation to time, place, and person were intact. Language including expression, naming, repetition, comprehension was assessed and found intact. Fund of Knowledge was assessed and was impaired.  Cranial Nerves II -  XII - II - Visual field intact OU. III, IV, VI - Extraocular movements intact. V - Facial sensation intact bilaterally. VII - Facial movement intact bilaterally. VIII - Hearing & vestibular intact bilaterally. X - Palate elevates symmetrically. XI - Chin turning & shoulder shrug intact bilaterally. XII - Tongue protrusion intact.  Motor Strength - [name] strength was normal in all extremities and pronator drift was absent.  Bulk was normal and fasciculations were absent.   Motor  Tone - Muscle tone was assessed at the neck and appendages and was normal.  Reflexes - [name] reflexes were symmetrical in all extremities and she had no pathological reflexes.  Sensory - Light touch, temperature/pinprick were assessed and were symmetrical.    Coordination - The patient had normal movements in the hands with no ataxia or dysmetria.  Tremor was absent.  Gait and Station - deferred.   ASSESSMENT/PLAN [name] is a 83 y.o. female with history of HTN, HLD, recent diagnosis of afib, positive stool guaiac, TIA admitted for sudden onset aphasia and right facial droop. tPA given at [facility].    TIA:  left brain cortical TIA, embolic pattern likely secondary to afib not on AC  Resultant back to baseline  MRI no acute stroke   MRA unremarkable   Carotid Doppler unremarkable  2D Echo EF 65-70%   EEG negative  LDL 71  HgbA1c 5.2  subq heparin for VTE prophylaxis  Diet Heart Room service appropriate? Yes; Fluid consistency: Thin   clopidogrel 75 mg daily prior to admission, now on ASA 325mg . GI OK to start [facility] tomorrow. Will have pharmacy consult on eliquis doing and start tomorrow am.    Patient counseled to be compliant with her antithrombotic medications  Ongoing aggressive stroke risk factor management  Therapy recommendations:  outpt  Disposition:  Likely home tomorrow  Hx of TIA  05/2016 left hand and left face numbness lasting 5 min - CT neg - ASA changed to DAPT and on lipitor  08/2016 LDL 110 and CUS neg  11/2016 - EF 65-70% - cardiac monitoring showed PAF - not on AC due to stool guaiac positive - scheduled for colonoscopy but not done yet  03/06/17 - admitted to [facility] for malaise, and low Na at 117 - improved  PAF  Found on cardiac monitoring   Not on AC due to stool guaiac positive  On plavix PTA  Currently rate controlled on coreg  Has outpt cardiology follow up   OK to start [facility] tomorrow per GI.    Positive stool occult blood  Found to have anemia   Stool guaiac positive at OSH  Colonoscopy scheduled but not able to do due to hospitalization  Decreased appetite but no abdominal pain - improving  appreciate GI recs  EGD and colonoscopy positive for hemorrhoids and 2 x polys s/p resection. OK to start [facility] tomorrow.  Hyponatremia  As low as 117 at [place] 129->134->131  EEG normal  Symptoms much improved  D/c IVF  Hypertension Still at high side  BP goal normotensive  On coreg for rate control  Resume hydralazine and telmisartan  Close monitoring  Hyperlipidemia  Home meds:  lipitor 20   LDL 71, goal < 70  Now lipitor resumed  Continue statin at discharge  Other Stroke Risk Factors  Advanced age  Other Active Problems  Anemia - acute blood loss - Hb 12.5 -> 12.0->11.7  Elevated Cre - Cre 1.08 -> 1.03->1.00  Hospital day # 3   Rosalin Hawking, MD PhD Stroke Neurology 03/11/2017 12:56 PM    To contact Stroke Continuity provider, please refer to [email or web address] After hours, contact General Neurology

## 2017-03-11 NOTE — Op Note (Signed)
[facility] Patient Name: Sherry Hess Procedure Date : 03/11/2017 MRN: [id number] Attending MD: Gatha Mayer , MD Date of Birth: 01/03/1931 CSN: [id number] Age: 83 Admit Type: Inpatient Procedure:                Upper GI endoscopy Indications:              Heme positive stool Providers:                Gatha Mayer, MD, Cleda Daub, RN, Tinnie Gens, Technician, Rejeana Brock, CRNA Referring MD:              Medicines:                Propofol per Anesthesia, Monitored Anesthesia Care Complications:            No immediate complications. Estimated Blood Loss:     Estimated blood loss: none. Procedure:                Pre-Anesthesia Assessment:                           - Prior to the procedure, a History and Physical                            was performed, and patient medications and                            allergies were reviewed. [name] tolerance of                            previous anesthesia was also reviewed. The risks                            and benefits of the procedure and the sedation                            options and risks were discussed with the patient.                            All questions were answered, and informed consent                            was obtained. Prior Anticoagulants: The patient                            last took Plavix (clopidogrel) 7 days prior to the                            procedure. ASA Grade Assessment: III - A patient                            with severe systemic disease. After reviewing the  risks and benefits, the patient was deemed in                            satisfactory condition to undergo the procedure.                           After obtaining informed consent, the endoscope was                            passed under direct vision. Throughout the                            procedure, the patient's blood pressure, pulse, and                     oxygen saturations were monitored continuously. The                            EG-[id number] ([id number]) scope was introduced through the                            mouth, and advanced to the second part of duodenum.                            The upper GI endoscopy was accomplished without                            difficulty. The patient tolerated the procedure                            well. Scope In: Scope Out: Findings:      The esophagus was normal.      The stomach was normal.      The examined duodenum was normal. Impression:                Moderate Sedation:      Please see anesthesia notes, moderate sedation not given Recommendation:           - See the other procedure note for documentation of                            additional recommendations.                           - Resume previous diet.                           - Continue present medications. Procedure Code(s):        --- Professional ---                           [phone], Esophagogastroduodenoscopy, flexible,                            transoral; diagnostic, including collection of  specimen(s) by brushing or washing, when performed                            (separate procedure) Diagnosis Code(s):        --- Professional ---                           R19.5, Other fecal abnormalities CPT copyright 2016 American Medical Association. All rights reserved. The codes documented in this report are preliminary and upon coder review may  be revised to meet current compliance requirements. Gatha Mayer, MD 03/11/2017 9:05:24 AM This report has been signed electronically. Number of Addenda: 0

## 2017-03-11 NOTE — Anesthesia Postprocedure Evaluation (Signed)
Anesthesia Post Note  Patient: Sherry Hess  Procedure(s) Performed: ESOPHAGOGASTRODUODENOSCOPY (EGD) (N/A ) COLONOSCOPY (N/A )     Patient location during evaluation: PACU Anesthesia Type: MAC Level of consciousness: awake and alert Pain management: pain level controlled Vital Signs Assessment: post-procedure vital signs reviewed and stable Respiratory status: spontaneous breathing and respiratory function stable Cardiovascular status: stable Postop Assessment: no apparent nausea or vomiting Anesthetic complications: no    Last Vitals:  Vitals:   03/11/17 0902 03/11/17 0910  BP: (!) 199/69 (!) 203/75  Pulse: 66 69  Resp: (!) 24 18  Temp: 36.4 C   SpO2: 97% 94%    Last Pain:  Vitals:   03/11/17 0902  TempSrc: Oral                 Hong Moring DANIEL

## 2017-03-11 NOTE — Transfer of Care (Signed)
Immediate Anesthesia Transfer of Care Note  Patient: Sherry Hess  Procedure(s) Performed: ESOPHAGOGASTRODUODENOSCOPY (EGD) (N/A ) COLONOSCOPY (N/A )  Patient Location: PACU  Anesthesia Type:MAC  Level of Consciousness: awake, alert  and oriented  Airway & Oxygen Therapy: Patient Spontanous Breathing  Post-op Assessment: Report given to RN and Post -op Vital signs reviewed and stable  Post vital signs: Reviewed and stable  Last Vitals:  Vitals:   03/11/17 0742 03/11/17 0902  BP: (!) 257/92   Pulse: 70 66  Resp: 20 (!) 24  Temp: 36.7 C   SpO2: 97% 97%    Last Pain:  Vitals:   03/11/17 0456  TempSrc: Oral         Complications: No apparent anesthesia complications

## 2017-03-11 NOTE — Progress Notes (Signed)
PT Cancellation Note  Patient Details Name: Sherry Hess MRN: [id number] DOB: 1931/02/02   Cancelled Treatment:    Reason Eval/Treat Not Completed: Patient at procedure or test/unavailable PT will follow up for treatment this afternoon as able. Thanks,   Dani Gobble. Migdalia Dk PT, DPT Acute Rehabilitation  [phone] Pager [phone]  [place] 03/11/2017, 9:23 AM

## 2017-03-11 NOTE — Progress Notes (Signed)
Physical Therapy Treatment Patient Details Name: Sherry Hess MRN: [id number] DOB: 1930-07-23 Today's Date: 03/11/2017    History of Present Illness Pt is an 83 y.o. female who was admitted to [facility] 10/21 with generalized weakness due to hyponatremia. She had decreased PO intake, and then was scheduled for colonoscopy due to hemoccult positive stool. She did the prep and was weak. At [place], her Na was found to be 117 and she was admitted for IV hydration. She improved and Then while her physician Sherry Hess) was discharging her, she developed sudden onset aphasia, and right facial droop. She then started speaking, but was severely dysarthric. She was evaluated by tele-neurology who administered tPA. Following tPA, she markedly improved and is now back to baseline. MRI negative for acute infarction. PMH significant for anxiety and hypertension.     PT Comments    Pt is making good progress towards her goals. Pt is currently mod I for bed mobility, and supervision for transfers, ambulation of 350 feet and for ascent/descent of 5 steps. Pt is currently steadier on her feet without a cane than with a cane as she can not work out sequencing. Pt was able to complete DGI with a score of 21 which is above the falls risk cut off of 19. Pt was educated on benefits of outpatient PT in increasing balance and endurance however she feels that she does not need it at this time.  PT will continue to follow this pt while she is in the acute setting.    Follow Up Recommendations  Outpatient PT     Equipment Recommendations  None recommended by PT    Recommendations for Other Services       Precautions / Restrictions Precautions Precautions: Fall Restrictions Weight Bearing Restrictions: No    Mobility  Bed Mobility Overal bed mobility: Needs Assistance Bed Mobility: Supine to Sit     Supine to sit: Modified independent (Device/Increase time)     General bed mobility  comments: used bed rail to pull to EoB  Transfers Overall transfer level: Needs assistance Equipment used: None Transfers: Sit to/from Stand Sit to Stand: Supervision         General transfer comment: Supervision for safety  Ambulation/Gait Ambulation/Gait assistance: Supervision Ambulation Distance (Feet): 350 Feet Assistive device: Straight cane;None Gait Pattern/deviations: Step-through pattern;Drifts right/left Gait velocity: slowed Gait velocity interpretation: Below normal speed for age/gender General Gait Details: Pt started ambulation with cane however only put it down every 2-3 steps despite vc, pt actually steadier without AD, slight drifting right and left with high level balance activities no LoB   Stairs Stairs: Yes   Stair Management: No rails;Step to pattern;Forwards Number of Stairs: 5 General stair comments: slow, safe acent/descent of steps  Wheelchair Mobility    Modified Rankin (Stroke Patients Only) Modified Rankin (Stroke Patients Only) Pre-Morbid Rankin Score: No significant disability Modified Rankin: Moderate disability     Balance Overall balance assessment: Needs assistance Sitting-balance support: No upper extremity supported;Feet supported Sitting balance-Leahy Scale: Good     Standing balance support: No upper extremity supported;Single extremity supported;During functional activity Standing balance-Leahy Scale: Good                   Standardized Balance Assessment Standardized Balance Assessment : Dynamic Gait Index   Dynamic Gait Index Level Surface: Normal Change in Gait Speed: Mild Impairment Gait with Horizontal Head Turns: Mild Impairment Gait with Vertical Head Turns: Normal Gait and Pivot Turn: Normal Step Over Obstacle:  Normal Step Around Obstacles: Normal Steps: Mild Impairment Total Score: 21      Cognition Arousal/Alertness: Awake/alert Behavior During Therapy: WFL for tasks assessed/performed Overall  Cognitive Status: Within Functional Limits for tasks assessed                                           General Comments General comments (skin integrity, edema, etc.): VSS throughout session      Pertinent Vitals/Pain Pain Assessment: No/denies pain           PT Goals (current goals can now be found in the care plan section) Acute Rehab PT Goals Patient Stated Goal: to go home PT Goal Formulation: With patient/family Time For Goal Achievement: 03/23/17 Potential to Achieve Goals: Good Progress towards PT goals: Progressing toward goals    Frequency    Min 4X/week      PT Plan Current plan remains appropriate       AM-PAC PT "6 Clicks" Daily Activity  Outcome Measure  Difficulty turning over in bed (including adjusting bedclothes, sheets and blankets)?: None Difficulty moving from lying on back to sitting on the side of the bed? : None Difficulty sitting down on and standing up from a chair with arms (e.g., wheelchair, bedside commode, etc,.)?: A Little Help needed moving to and from a bed to chair (including a wheelchair)?: A Little Help needed walking in hospital room?: A Little Help needed climbing 3-5 steps with a railing? : A Little 6 Click Score: 20    End of Session Equipment Utilized During Treatment: Gait belt Activity Tolerance: Patient tolerated treatment well Patient left: with call bell/phone within reach;in bed   PT Visit Diagnosis: Unsteadiness on feet (R26.81);Difficulty in walking, not elsewhere classified (R26.2);Other symptoms and signs involving the nervous system (V72.820)     Time: 6015-6153 PT Time Calculation (min) (ACUTE ONLY): 17 min  Charges:  $Gait Training: 8-22 mins                    G Codes:       Sherry Hess B. Migdalia Dk PT, DPT Acute Rehabilitation  [phone] Pager [phone]     [place] 03/11/2017, 4:42 PM

## 2017-03-11 NOTE — Progress Notes (Signed)
Benefits check for Eliquis revealed:   # 6. S/W MIKE @ [employer] # [phone] OPT- 2     ELIQUIS  50 MG BID  COVER- YES  CO-PAY- $ 45.00  TIER- 3 DRUG  PRIOR APPROVAL- NO   PHARMACY : ANY RETAIL   Pt states this if affordable for her.

## 2017-03-11 NOTE — Care Management Note (Signed)
Case Management Note  Patient Details  Name: Sherry Hess MRN: [id number] Date of Birth: 21-Mar-1931  Subjective/Objective:                    Action/Plan: Patient to start on Eliquis tomorrow per MD note. CM submitted benefits check to see cost for patient. CM provided her with 30 day free card.  Pt also recommended for outpatient therapy. CM inquired about her attending in [facility]. Currently she is refusing outpatient therapy. She states she has done it before and doesn't care to go again.  CM following.   Expected Discharge Date:                  Expected Discharge Plan:  OP Rehab  In-House Referral:     Discharge planning Services  CM Consult, Other - See comment (eliquis coupon)  Post Acute Care Choice:    Choice offered to:     DME Arranged:    DME Agency:     HH Arranged:    HH Agency:     Status of Service:  In process, will continue to follow  If discussed at Long Length of Stay Meetings, dates discussed:    Additional Comments:  Pollie Friar, RN 03/11/2017, 2:41 PM

## 2017-03-11 NOTE — Progress Notes (Signed)
ANTICOAGULATION CONSULT NOTE - Initial Consult  Pharmacy Consult for Apixaban Indication:  Nonvalvular Atrial Fibrillation, recent new diagnosis  Allergies  Allergen Reactions  . Adhesive [Tape]   . Clonidine Derivatives Other (See Comments)    [name]  . Minoxidil     rash  . Norvasc [Amlodipine Besylate]     rash    Patient Measurements: Height: 5' 3.5" (161.3 cm) Weight: 131 lb 6.3 oz (59.6 kg) IBW/kg (Calculated) : 53.55    Vital Signs: Temp: 97.5 F (36.4 C) (10/26 1100) Temp Source: Axillary (10/26 1100) BP: 193/68 (10/26 1100) Pulse Rate: 67 (10/26 1100)  Labs:  Recent Labs  03/08/17 1904 03/10/17 0356 03/11/17 0609  HGB 12.5 12.0 11.7*  HCT 37.0 36.5 35.8*  PLT 160 155 132*  CREATININE 1.08* 1.03* 1.00    Estimated Creatinine Clearance: 35.4 mL/min (by C-G formula based on SCr of 1 mg/dL).   Medical History: Past Medical History:  Diagnosis Date  . Anxiety   . High cholesterol   . Hypertension   . TIA (transient ischemic attack)     Medications:  Prescriptions Prior to Admission  Medication Sig Dispense Refill Last Dose  . atorvastatin (LIPITOR) 20 MG tablet Take 20 mg by mouth daily.   03/06/2017  . busPIRone (BUSPAR) 15 MG tablet Take 7.5 mg by mouth 2 (two) times daily.    03/06/2017  . carvedilol (COREG) 12.5 MG tablet Take 12.5 mg by mouth 2 (two) times daily.   03/06/2017 at 1800  . clopidogrel (PLAVIX) 75 MG tablet TAKE 1 TABLET BY MOUTH DAILY (Patient taking differently: TAKE 75mg  TABLET BY MOUTH DAILY) 30 tablet 6 03/06/2017 at 1200  . Fish Oil-Cholecalciferol (FISH OIL + D3) 1000-1000 MG-UNIT CAPS Take by mouth.   03/06/2017  . hydrALAZINE (APRESOLINE) 25 MG tablet Take 0.5 tablets (12.5 mg total) by mouth 3 (three) times daily. 90 tablet 0 03/06/2017  . lisinopril (PRINIVIL,ZESTRIL) 40 MG tablet Take 40 mg by mouth daily.   03/06/2017  . Multiple Minerals-Vitamins (CALCIUM & VIT D3 BONE HEALTH PO) Take by mouth.    03/06/2017  . Multiple Vitamins-Minerals (PRESERVISION AREDS 2+MULTI VIT) CAPS Take 1 capsule by mouth daily.   03/06/2017  . omeprazole (PRILOSEC) 20 MG capsule Take 20 mg by mouth daily.    03/06/2017  . telmisartan (MICARDIS) 80 MG tablet Take 0.5 tablets by mouth at bedtime.   03/06/2017    Assessment: 83 y.o female with recent diagnosis of Afib, positive stool guaiac.  Admitted at [facility] left brain cortical TIA, embolic likely 2/2 to afib, not on anticoagulation PTA. Received tPA at [facility].   S/p  EGD and colonoscopy today 03/11/17, only found to have hemorrhoids and 2 polys with resection. OK to start [facility] tomorrow. Pharmacy consulted to start Elqius tomorrow 03/12/17.   Age >80, wt <60 kg, Scr 1.0--- will start Elquis at lower dose 2.5 mg po BID due to age>80, wt <60 kg   Goal of Therapy:  Monitor platelets by anticoagulation protocol: Yes   Plan:  Tomorrow 03/12/17 start Eliquis 2.5 mg po BID Monitor for s/sx of bleeding Will education patient about Elquis prior discharge.   Thank you for allowing pharmacy to be part of this patients care team. Nicole Cella, [place] Clinical Pharmacist Pager: [phone]-[phone] [phone] phone [phone] or [phone] Main pharmacy [phone] 03/11/2017,1:27 PM

## 2017-03-11 NOTE — Care Management Important Message (Signed)
Important Message  Patient Details  Name: Sherry Hess MRN: [id number] Date of Birth: 18-Mar-1931   Medicare Important Message Given:  Yes    Nathen May 03/11/2017, 9:56 AM

## 2017-03-12 ENCOUNTER — Encounter (HOSPITAL_COMMUNITY): Payer: Self-pay | Admitting: Internal Medicine

## 2017-03-12 LAB — BASIC METABOLIC PANEL
ANION GAP: 13 (ref 5–15)
BUN: 13 mg/dL (ref 6–20)
CO2: 20 mmol/L — ABNORMAL LOW (ref 22–32)
Calcium: 8.6 mg/dL — ABNORMAL LOW (ref 8.9–10.3)
Chloride: 100 mmol/L — ABNORMAL LOW (ref 101–111)
Creatinine, Ser: 1.13 mg/dL — ABNORMAL HIGH (ref 0.44–1.00)
GFR, EST AFRICAN AMERICAN: 50 mL/min — AB (ref >60)
GFR, EST NON AFRICAN AMERICAN: 43 mL/min — AB (ref >60)
Glucose, Bld: 95 mg/dL (ref 65–99)
POTASSIUM: 3.5 mmol/L (ref 3.5–5.1)
SODIUM: 133 mmol/L — AB (ref 135–145)

## 2017-03-12 LAB — CBC
HCT: 35.6 % — ABNORMAL LOW (ref 36.0–46.0)
Hemoglobin: 11.9 g/dL — ABNORMAL LOW (ref 12.0–15.0)
MCH: 26.2 pg (ref 26.0–34.0)
MCHC: 33.4 g/dL (ref 30.0–36.0)
MCV: 78.4 fL (ref 78.0–100.0)
Platelets: 152 10*3/uL (ref 150–400)
RBC: 4.54 MIL/uL (ref 3.87–5.11)
RDW: 16.6 % — ABNORMAL HIGH (ref 11.5–15.5)
WBC: 3.9 10*3/uL — ABNORMAL LOW (ref 4.0–10.5)

## 2017-03-12 MED ORDER — HYDRALAZINE HCL 25 MG PO TABS: 25.0000 mg | ORAL_TABLET | Freq: Three times a day (TID) | ORAL | 6 refills | Status: DC

## 2017-03-12 MED ORDER — APIXABAN 2.5 MG PO TABS: 2.5000 mg | ORAL_TABLET | Freq: Two times a day (BID) | ORAL | 6 refills | Status: DC

## 2017-03-12 MED ORDER — HYDRALAZINE HCL 25 MG PO TABS
25.0000 mg | ORAL_TABLET | Freq: Three times a day (TID) | ORAL | Status: DC
  Administered 2017-03-12: 25 mg via ORAL
  Filled 2017-03-12: qty 1

## 2017-03-12 NOTE — Progress Notes (Signed)
Patient ready for discharge to home; discharge instructions given and reviewed; Rx sent electronically and reviewed; son present to accompany patient home; patient discharged out via wheelchair.

## 2017-03-12 NOTE — Discharge Summary (Signed)
Stroke Discharge Summary  Patient ID: Sherry Hess   MRN: [id number]      DOB: Mar 12, 1931  Date of Admission: 03/08/2017 Date of Discharge: 03/12/2017  Attending Physician:  Rosalin Hawking, MD, Stroke MD Consultant(s):   Treatment Team:  Stroke, Md, MD GI Dr Carlean Purl Patient's PCP:  Ernestene Kiel, MD  DISCHARGE DIAGNOSIS: Left brain cortical TIA, embolic pattern likely secondary to afib.  Active Problems:   CVA (cerebral vascular accident) ([place])   HLD (hyperlipidemia)   Occult blood positive stool   Dyspepsia   Normocytic anemia   Benign neoplasm of sigmoid colon   Benign neoplasm of rectum   Past Medical History:  Diagnosis Date  . Anxiety   . High cholesterol   . Hypertension   . TIA (transient ischemic attack)    Past Surgical History:  Procedure Laterality Date  . TONSILLECTOMY    . VESICOVAGINAL FISTULA CLOSURE W/ TAH      Allergies as of 03/12/2017      Reactions   Adhesive [tape]    Clonidine Derivatives Other (See Comments)   [name]   Minoxidil    rash   Norvasc [amlodipine Besylate]    rash      Medication List    STOP taking these medications   clopidogrel 75 MG tablet Commonly known as:  PLAVIX   lisinopril 40 MG tablet Commonly known as:  PRINIVIL,ZESTRIL   PRESERVISION AREDS 2+MULTI VIT Caps     TAKE these medications   apixaban 2.5 MG Tabs tablet Commonly known as:  ELIQUIS Take 1 tablet (2.5 mg total) by mouth 2 (two) times daily.   atorvastatin 20 MG tablet Commonly known as:  LIPITOR Take 20 mg by mouth daily.   busPIRone 15 MG tablet Commonly known as:  BUSPAR Take 7.5 mg by mouth 2 (two) times daily.   CALCIUM & VIT D3 BONE HEALTH PO Take by mouth.   carvedilol 12.5 MG tablet Commonly known as:  COREG Take 12.5 mg by mouth 2 (two) times daily.   FISH OIL + D3 1000-1000 MG-UNIT Caps Take by mouth.   hydrALAZINE 25 MG tablet Commonly known as:  APRESOLINE Take 1 tablet (25 mg total) by mouth  3 (three) times daily. What changed:  how much to take   omeprazole 20 MG capsule Commonly known as:  PRILOSEC Take 20 mg by mouth daily.   telmisartan 80 MG tablet Commonly known as:  MICARDIS Take 0.5 tablets by mouth at bedtime.       LABORATORY STUDIES CBC    Component Value Date/Time   WBC 3.9 (L) 03/12/2017 0604   RBC 4.54 03/12/2017 0604   HGB 11.9 (L) 03/12/2017 0604   HGB 10.3 (L) 01/26/2017 1431   HCT 35.6 (L) 03/12/2017 0604   HCT 32.4 (L) 01/26/2017 1431   PLT 152 03/12/2017 0604   PLT 285 01/26/2017 1431   MCV 78.4 03/12/2017 0604   MCV 82 01/26/2017 1431   MCH 26.2 03/12/2017 0604   MCHC 33.4 03/12/2017 0604   RDW 16.6 (H) 03/12/2017 0604   RDW 14.8 01/26/2017 1431   LYMPHSABS 1.5 01/26/2017 1431   EOSABS 0.1 01/26/2017 1431   BASOSABS 0.0 01/26/2017 1431   CMP    Component Value Date/Time   NA 133 (L) 03/12/2017 0604   K 3.5 03/12/2017 0604   CL 100 (L) 03/12/2017 0604   CO2 20 (L) 03/12/2017 0604   GLUCOSE 95 03/12/2017 0604   BUN 13  03/12/2017 0604   CREATININE 1.13 (H) 03/12/2017 0604   CALCIUM 8.6 (L) 03/12/2017 0604   PROT 5.8 (L) 03/08/2017 1904   ALBUMIN 3.2 (L) 03/08/2017 1904   AST 19 03/08/2017 1904   ALT 12 (L) 03/08/2017 1904   ALKPHOS 59 03/08/2017 1904   BILITOT 0.8 03/08/2017 1904   GFRNONAA 43 (L) 03/12/2017 0604   GFRAA 50 (L) 03/12/2017 0604   COAGS Lab Results  Component Value Date   INR 1.1 01/26/2017   Lipid Panel    Component Value Date/Time   CHOL 117 03/09/2017 0226   TRIG 50 03/09/2017 0226   HDL 36 (L) 03/09/2017 0226   CHOLHDL 3.3 03/09/2017 0226   VLDL 10 03/09/2017 0226   LDLCALC 71 03/09/2017 0226   HgbA1C  Lab Results  Component Value Date   HGBA1C 5.2 03/09/2017   Urinalysis No results found for: COLORURINE, APPEARANCEUR, LABSPEC, PHURINE, GLUCOSEU, HGBUR, BILIRUBINUR, KETONESUR, PROTEINUR, UROBILINOGEN, NITRITE, LEUKOCYTESUR Urine Drug Screen No results found for: LABOPIA, COCAINSCRNUR,  LABBENZ, AMPHETMU, THCU, LABBARB  Alcohol Level No results found for: ETH   SIGNIFICANT DIAGNOSTIC STUDIES  Mri and Mra Brain Wo Contrast 03/09/2017 IMPRESSION:  1. No acute or subacute infarct.  2. Atrophy and white matter changes are moderately advanced for age. This likely reflects the sequela of chronic microvascular ischemia.  3. MRA circle Willis demonstrates mild diffuse small vessel disease without significant proximal stenosis, aneurysm, or branch vessel occlusion within the circle of Willis.   EEG normal EEG  CUS - Bilateral: 1-39% ICA stenosis. Vertebral artery flow is antegrade.  TTE - Pt in atrial fibrillation during the study; normal LV systolicfunction; trace AI; mild LAE; mild TR with mildly elevated pulmonary pressure.      HISTORY OF PRESENT ILLNESS Sherry Hess is a 83 y.o. female who was admitted on 10/21 with generalized weakness felt due to hyponatremia. She had decreased PO intake, and then was scheduled for colonoscopy due to hemoccult positive stool. She did the prep and was weak. At [facility], her Na was found to be 117 and she was admitted for IV hydration. She improved and  Then while her physician(Sendil [place] Pink) was discharging her, she developed sudden onset aphasia, and right facial droop. She then started speaking, but was severely dysarthric.   She was evaluated by tele-neurology who administered tPA.  Following tPA, she markedly improved and is now back to baseline.  She remembers the events, but seems foggy on specifics.   LKW: 11am      tpa given?: yes  HOSPITAL COURSE Sherry Hess is a 83 y.o. female with history of HTN, HLD, recent diagnosis of afib, positive stool guaiac, TIA admitted for sudden onset aphasia and right facial droop. tPA given at [facility].    TIA:  left brain cortical TIA, embolic pattern likely secondary to afib not on AC  Resultant back to baseline  MRI no acute stroke   MRA  unremarkable   Carotid Doppler unremarkable  2D Echo - EF 65-70%. The patient was in atrial fibrillation during the study.  EEG negative  LDL 71  HgbA1c 5.2  subq heparin for VTE prophylaxis  Diet clear liquid Room service appropriate? Yes; Fluid consistency: Thin  Diet NPO time specified   clopidogrel 75 mg daily prior to admission, now on ASA 325mg . Changed to eliquis.  Patient counseled to be compliant with her antithrombotic medications  Ongoing aggressive stroke risk factor management  Therapy recommendations:  outpatient physical therapy  Disposition:  discharge to home  Hx of TIA  05/2016 left hand and left face numbness lasting 5 min - CT neg - ASA changed to DAPT and on lipitor  08/2016 LDL 110 and CUS neg  11/2016 - EF 65-70% - cardiac monitoring showed PAF - not on AC due to stool guaiac positive - scheduled for colonoscopy but not done yet  03/06/17 - admitted to [facility] for malaise, and low Na at 117 - improved  PAF  Found on cardiac monitoring   Not on AC due to stool guaiac positive  On plavix PTA  Currently rate controlled on coreg  Has outpt cardiology follow up   Eliquis for stroke prevention.  Positive stool occult blood  Found to have anemia   Stool guaiac positive at OSH  Colonoscopy scheduled but not able to do due to hospitalization  Decreased appetite but no abdominal pain - improving  GI recs -    May resume or start anticoagulant agents (03/12/2017)                           - plavix vs Xa inhibitor etc                           - No repeat colonoscopy due to age.  EGD and colonoscopy (03/11/2017 Dr Carlean Purl) - Findings as noted below:  Rectal polyp  Benign neoplasm of sigmoid colon  Residual hemorrhoidal skin tags  Other hemorrhoids  Other fecal abnormalities  Diverticulosis of large intestine without perforation or abscess without bleeding  The esophagus was normal.  The stomach was normal.  The examined  duodenum was normal.  Hyponatremia  As low as 117 at [facility]  On this admission Na 129  Today 134  EEG normal  Symptoms much improved  Continue IVF @ 40  Hypertension  Stable at high side  Permissive hypertension (OK if <180/105) for 24-48 hours post stroke and then gradually normalized within 5-7 days.  Long term BP goal normotensive  On coreg for rate control  BP running high. Increase hydralazine  Hyperlipidemia  Home meds:  lipitor 20   LDL 71, goal < 70  Now lipitor resumed  Continue statin at discharge  Other Stroke Risk Factors  Advanced age  Other Active Problems  Anemia - acute blood loss - Hb 12.5 -> 12.0  Elevated Cre - Cre 1.08 -> 1.03 -> 1.13  DISCHARGE EXAM Vitals:   03/12/17 0013 03/12/17 0100 03/12/17 0404 03/12/17 1009  BP: (!) 196/56 (!) 176/56 (!) 181/69 (!) 170/70  Pulse: 72 64 60 (!) 56  Resp: 20 20    Temp: 98.6 F (37 C) 98.2 F (36.8 C) 98.1 F (36.7 C) 98.2 F (36.8 C)  TempSrc: Oral Oral Oral Oral  SpO2: 97% 96% 93% 96%  Weight:      Height:       General - Well nourished, well developed, in no apparent distress.  Ophthalmologic - fundi not visualized due to noncooperation.  Cardiovascular - Regular rate and rhythm with no murmur, not in afib.  Mental Status -  Level of arousal and orientation to time, place, and person were intact. Language including expression, naming, repetition, comprehension was assessed and found intact. Fund of Knowledge was assessed and was impaired.  Cranial Nerves II - XII - II - Visual field intact OU. III, IV, VI - Extraocular movements intact. V - Facial sensation intact bilaterally.  VII - Facial movement intact bilaterally. VIII - Hearing & vestibular intact bilaterally. X - Palate elevates symmetrically. XI - Chin turning & shoulder shrug intact bilaterally. XII - Tongue protrusion intact.  Motor Strength - [name] strength was normal in all  extremities and pronator drift was absent.  Bulk was normal and fasciculations were absent.   Motor Tone - Muscle tone was assessed at the neck and appendages and was normal.  Reflexes - [name] reflexes were symmetrical in all extremities and she had no pathological reflexes.  Sensory - Light touch, temperature/pinprick were assessed and were symmetrical.    Coordination - The patient had normal movements in the hands with no ataxia or dysmetria.  Tremor was absent.  Gait and Station - deferred.   Patient Discharge Instructions 1. Report any bleeding to your physicians without delay. 2. Out patient Physical Therapy will be scheduled. 3. Take medications as instructed.  4. Increase activity gradually as tolerated. 5. CBC and Bmet blood tests next office visit.    Discharge Diet   Diet Heart Room service appropriate? Yes; Fluid consistency: Thin liquids  DISCHARGE PLAN  Disposition:  Discharge to home.  Eliquis (apixaban) daily for secondary stroke prevention.  Ongoing risk factor control by Primary Care Physician at time of discharge  Follow-up Ernestene Kiel, MD in 2 weeks.  Follow-up with Cecille Rubin NP [facility] in 6 weeks, office to schedule an appointment.  Follow-up Dr Carlean Purl as instructed.  Outpatient Physical Therapy  40 minutes were spent preparing discharge.  Mikey Bussing PA-C [facility] Pager [phone] 03/12/2017, 11:28 AM

## 2017-03-12 NOTE — Progress Notes (Signed)
Occupational Therapy Treatment Patient Details Name: Sherry Hess MRN: [id number] DOB: 1930-11-03 Today's Date: 03/12/2017    History of present illness Pt is an 83 y.o. female who was admitted to [facility] 10/21 with generalized weakness due to hyponatremia. She had decreased PO intake, and then was scheduled for colonoscopy due to hemoccult positive stool. She did the prep and was weak. At [place], her Na was found to be 117 and she was admitted for IV hydration. She improved and Then while her physician Gevena Barre) was discharging her, she developed sudden onset aphasia, and right facial droop. She then started speaking, but was severely dysarthric. She was evaluated by tele-neurology who administered tPA. Following tPA, she markedly improved and is now back to baseline. MRI negative for acute infarction. PMH significant for anxiety and hypertension.    OT comments  Pt. Able to complete shower stall transfer with min guard A.  Educated on safety and compensatory strategies for fall prevention during functional mobility as pt. Had LOB x1 during amb. To b.room.    Follow Up Recommendations  No OT follow up;Supervision/Assistance - 24 hour    Equipment Recommendations  3 in 1 bedside commode    Recommendations for Other Services      Precautions / Restrictions Precautions Precautions: Fall Precaution Comments: slightly unsteady on feet       Mobility Bed Mobility Overal bed mobility: Modified Independent Bed Mobility: Supine to Sit     Supine to sit: Modified independent (Device/Increase time)     General bed mobility comments: hob flat, no rails, exits on R side of bed at home  Transfers Overall transfer level: Needs assistance Equipment used: None Transfers: Sit to/from [facility] Sit to Stand: Supervision Stand pivot transfers: Min guard            Balance                                           ADL  either performed or assessed with clinical judgement   ADL Overall ADL's : Needs assistance/impaired                                 Tub/ Shower Transfer: Walk-in shower;Min guard;Ambulation Tub/Shower Transfer Details (indicate cue type and reason): reports she does not have grab bars but "has things she holds onto" Functional mobility during ADLs: Minimal assistance General ADL Comments: LOB x 1 to the right during ambulation to the b.room.  occured when she looked down at her gown while walking and talking about how heavy the cardiac monitor box was in her gown pocket.  required physical assist from the therapist asst. to regain balance.  reviewed multiple strategies for improving safety during funtional mobility.  ie: limiting distractions, sitting if she feels dizzy, allowing time to "get her bearings" before standing if she has been laying down for a while, performing tasks seated if able like combing hair and food prep.  pt. verbalized understanding.  requested she not perform shower tansfers without son present initially.     Vision       Perception     Praxis      Cognition Arousal/Alertness: Awake/alert Behavior During Therapy: WFL for tasks assessed/performed Overall Cognitive Status: Within Functional Limits for tasks assessed  Exercises     Shoulder Instructions       General Comments      Pertinent Vitals/ Pain       Pain Assessment: No/denies pain  Home Living                                          Prior Functioning/Environment              Frequency  Min 2X/week        Progress Toward Goals  OT Goals(current goals can now be found in the care plan section)  Progress towards OT goals: Progressing toward goals     Plan      Co-evaluation                 AM-PAC PT "6 Clicks" Daily Activity     Outcome Measure   Help from another person  eating meals?: None Help from another person taking care of personal grooming?: A Little Help from another person toileting, which includes using toliet, bedpan, or urinal?: A Little Help from another person bathing (including washing, rinsing, drying)?: A Little Help from another person to put on and taking off regular upper body clothing?: None Help from another person to put on and taking off regular lower body clothing?: A Little 6 Click Score: 20    End of Session    OT Visit Diagnosis: Other abnormalities of gait and mobility (R26.89)   Activity Tolerance Patient tolerated treatment well   Patient Left in chair;with call bell/phone within reach   Nurse Communication          Time: 0812-0820 OT Time Calculation (min): 8 min  Charges: OT General Charges $OT Visit: 1 Visit OT Treatments $Self Care/Home Management : 8-22 mins   Janice Coffin, COTA/L 03/12/2017, 8:49 AM

## 2017-03-12 NOTE — Discharge Instructions (Signed)
Information on my medicine - ELIQUIS (apixaban)  This medication education was reviewed with me or my healthcare representative as part of my discharge preparation.   Why was Eliquis prescribed for you? Eliquis was prescribed for you to reduce the risk of a blood clot forming that can cause a stroke if you have a medical condition called atrial fibrillation (a type of irregular heartbeat).  What do You need to know about Eliquis ? Take your Eliquis TWICE DAILY - one tablet in the morning and one tablet in the evening with or without food. If you have difficulty swallowing the tablet whole please discuss with your pharmacist how to take the medication safely.  Take Eliquis exactly as prescribed by your doctor and DO NOT stop taking Eliquis without talking to the doctor who prescribed the medication.  Stopping may increase your risk of developing a stroke.  Refill your prescription before you run out.  After discharge, you should have regular check-up appointments with your healthcare provider that is prescribing your Eliquis.  In the future your dose may need to be changed if your kidney function or weight changes by a significant amount or as you get older.  What do you do if you miss a dose? If you miss a dose, take it as soon as you remember on the same day and resume taking twice daily.  Do not take more than one dose of ELIQUIS at the same time to make up a missed dose.  Important Safety Information A possible side effect of Eliquis is bleeding. You should call your healthcare provider right away if you experience any of the following: ? Bleeding from an injury or your nose that does not stop. ? Unusual colored urine (red or dark brown) or unusual colored stools (red or black). ? Unusual bruising for unknown reasons. ? A serious fall or if you hit your head (even if there is no bleeding).  Some medicines may interact with Eliquis and might increase your risk of bleeding or  clotting while on Eliquis. To help avoid this, consult your healthcare provider or pharmacist prior to using any new prescription or non-prescription medications, including herbals, vitamins, non-steroidal anti-inflammatory drugs (NSAIDs) and supplements.  This website has more information on Eliquis (apixaban): http://www.eliquis.com/eliquis/home  1. Report any bleeding to your physicians without delay. 2. Out patient Physical Therapy will be scheduled. 3. Take medications as instructed.  4. Increase activity gradually as tolerated. 5. CBC and Bmet blood tests next office visit.  2.

## 2017-03-15 ENCOUNTER — Telehealth: Payer: Self-pay

## 2017-03-15 DIAGNOSIS — E871 Hypo-osmolality and hyponatremia: Secondary | ICD-10-CM | POA: Diagnosis not present

## 2017-03-15 DIAGNOSIS — Z09 Encounter for follow-up examination after completed treatment for conditions other than malignant neoplasm: Secondary | ICD-10-CM | POA: Diagnosis not present

## 2017-03-15 DIAGNOSIS — I10 Essential (primary) hypertension: Secondary | ICD-10-CM | POA: Diagnosis not present

## 2017-03-15 DIAGNOSIS — D649 Anemia, unspecified: Secondary | ICD-10-CM | POA: Diagnosis not present

## 2017-03-15 NOTE — Telephone Encounter (Signed)
-----   Message from Rosalin Hawking, MD sent at 03/14/2017  5:16 PM EDT ----- Could you please let the patient know that the colon polyp biopsy in the hospital was benign, no cancer. Thanks.  Rosalin Hawking, MD PhD Stroke Neurology 03/14/2017 5:16 PM

## 2017-03-15 NOTE — Telephone Encounter (Signed)
Rn call patient that the colon polyp biopsy that was done in the hospital was benign not cancer. PT verbalized understanding. ------

## 2017-03-21 DIAGNOSIS — D649 Anemia, unspecified: Secondary | ICD-10-CM | POA: Diagnosis not present

## 2017-03-21 DIAGNOSIS — Z79899 Other long term (current) drug therapy: Secondary | ICD-10-CM | POA: Diagnosis not present

## 2017-03-21 DIAGNOSIS — I10 Essential (primary) hypertension: Secondary | ICD-10-CM | POA: Diagnosis not present

## 2017-03-21 DIAGNOSIS — E871 Hypo-osmolality and hyponatremia: Secondary | ICD-10-CM | POA: Diagnosis not present

## 2017-03-25 DIAGNOSIS — R4182 Altered mental status, unspecified: Secondary | ICD-10-CM | POA: Diagnosis not present

## 2017-03-25 DIAGNOSIS — R079 Chest pain, unspecified: Secondary | ICD-10-CM | POA: Diagnosis not present

## 2017-03-25 DIAGNOSIS — R531 Weakness: Secondary | ICD-10-CM | POA: Diagnosis not present

## 2017-03-28 ENCOUNTER — Ambulatory Visit: Payer: PPO | Admitting: Cardiology

## 2017-03-28 ENCOUNTER — Encounter: Payer: Self-pay | Admitting: Cardiology

## 2017-03-28 VITALS — BP 134/80 | HR 72 | Resp 10 | Ht 63.5 in | Wt 137.0 lb

## 2017-03-28 DIAGNOSIS — I480 Paroxysmal atrial fibrillation: Secondary | ICD-10-CM

## 2017-03-28 DIAGNOSIS — D649 Anemia, unspecified: Secondary | ICD-10-CM | POA: Diagnosis not present

## 2017-03-28 DIAGNOSIS — I10 Essential (primary) hypertension: Secondary | ICD-10-CM | POA: Diagnosis not present

## 2017-03-28 NOTE — Progress Notes (Signed)
Cardiology Office Note:    Date:  03/28/2017   ID:  Sherry Hess, DOB 05/04/1931, MRN [id number]  PCP:  Ernestene Kiel, MD  Cardiologist:  [name], MD    Referring MD: Ernestene Kiel, MD   Chief Complaint  Patient presents with  . 1 month follow up  I am doing better  History of Present Illness:    Sherry Hess is a 83 y.o. female with history of CVA, essential hypertension, proximal atrial fibrillation, she is anticoagulated however she was anemic and she had a GI workup done upper GI was normal however the lower GI showed some polyps that were removed.  We assuming that that was the source of bleeding I will do her CBC today as well as a Chem-7 Chem-7 will be to check her kidney function with anticipation of hopefully being able to start ACE inhibitor.  She does have baseline kidney dysfunction she is scheduled to see nephrologist.   Past Medical History:  Diagnosis Date  . Anxiety   . High cholesterol   . Hypertension   . TIA (transient ischemic attack)     Past Surgical History:  Procedure Laterality Date  . TONSILLECTOMY    . VESICOVAGINAL FISTULA CLOSURE W/ TAH      Current Medications: Current Meds  Medication Sig  . apixaban (ELIQUIS) 2.5 MG TABS tablet Take 1 tablet (2.5 mg total) by mouth 2 (two) times daily.  Marland Kitchen atorvastatin (LIPITOR) 20 MG tablet Take 20 mg by mouth daily.  . busPIRone (BUSPAR) 15 MG tablet Take 7.5 mg by mouth 2 (two) times daily.   . carvedilol (COREG) 12.5 MG tablet Take 12.5 mg by mouth 2 (two) times daily.  . Ferrous Sulfate ([employer] SLOW RELEASE IRON PO) Take 1 tablet daily by mouth.  . Fish Oil-Cholecalciferol (FISH OIL + D3) 1000-1000 MG-UNIT CAPS Take by mouth.  . hydrALAZINE (APRESOLINE) 25 MG tablet Take 1 tablet (25 mg total) by mouth 3 (three) times daily.  . Multiple Minerals-Vitamins (CALCIUM & VIT D3 BONE HEALTH PO) Take by mouth.  Marland Kitchen omeprazole (PRILOSEC) 20 MG capsule Take 20 mg by mouth daily.   .  sodium bicarbonate 650 MG tablet Take 650 mg 2 (two) times daily by mouth.  . telmisartan (MICARDIS) 80 MG tablet Take 0.5 tablets by mouth at bedtime.     Allergies:   Adhesive [tape]; Clonidine derivatives; Minoxidil; and Norvasc [amlodipine besylate]   Social History   Socioeconomic History  . Marital status: Widowed    Spouse name: None  . Number of children: None  . Years of education: None  . Highest education level: None  Social Needs  . Financial resource strain: None  . Food insecurity - worry: None  . Food insecurity - inability: None  . Transportation needs - medical: None  . Transportation needs - non-medical: None  Occupational History  . None  Tobacco Use  . Smoking status: Never Smoker  . Smokeless tobacco: Never Used  Substance and Sexual Activity  . Alcohol use: No  . Drug use: No  . Sexual activity: None  Other Topics Concern  . None  Social History Narrative  . None     Family History: [name] family history includes Cancer in her brother and father; Stroke in her mother. There is no history of Ataxia, Chorea, Dementia, Mental retardation, Migraines, Multiple sclerosis, Neurofibromatosis, Neuropathy, Parkinsonism, or Seizures. ROS:   Please see the history of present illness.    All 14 point review  of systems negative except as described per history of present illness  EKGs/Labs/Other Studies Reviewed:      Recent Labs: 03/08/2017: ALT 12 03/10/2017: TSH 2.596 03/12/2017: BUN 13; Creatinine, Ser 1.13; Hemoglobin 11.9; Platelets 152; Potassium 3.5; Sodium 133  Recent Lipid Panel    Component Value Date/Time   CHOL 117 03/09/2017 0226   TRIG 50 03/09/2017 0226   HDL 36 (L) 03/09/2017 0226   CHOLHDL 3.3 03/09/2017 0226   VLDL 10 03/09/2017 0226   LDLCALC 71 03/09/2017 0226    Physical Exam:    VS:  BP 134/80   Pulse 72   Resp 10   Ht 5' 3.5" (1.613 m)   Wt 137 lb (62.1 kg)   BMI 23.89 kg/m     Wt Readings from Last 3  Encounters:  03/28/17 137 lb (62.1 kg)  03/08/17 131 lb 6.3 oz (59.6 kg)  02/23/17 136 lb 12.8 oz (62.1 kg)     GEN:  Well nourished, well developed in no acute distress HEENT: Normal NECK: No JVD; No carotid bruits LYMPHATICS: No lymphadenopathy CARDIAC: RRR, no murmurs, no rubs, no gallops RESPIRATORY:  Clear to auscultation without rales, wheezing or rhonchi  ABDOMEN: Soft, non-tender, non-distended MUSCULOSKELETAL:  No edema; No deformity  SKIN: Warm and dry LOWER EXTREMITIES: no swelling NEUROLOGIC:  Alert and oriented x 3 PSYCHIATRIC:  Normal affect   ASSESSMENT:    1. Essential hypertension   2. Paroxysmal atrial fibrillation (HCC)   3. Normocytic anemia    PLAN:    In order of problems listed above:  1. Essential hypertension: Blood pressure is decently controlled today in the office but she tells me when she checked her blood pressure at home is usually higher we will check a Chem-7 to see if we can reinitiate ACE inhibitor. 2. Paroxysmal atrial fibrillation: Stable, irregular today anticoagulated with Eliquis 2.5 twice daily 3. Anemia: We will check her CBC today   Medication Adjustments/Labs and Tests Ordered: Current medicines are reviewed at length with the patient today.  Concerns regarding medicines are outlined above.  No orders of the defined types were placed in this encounter.  Medication changes: No orders of the defined types were placed in this encounter.   Signed, Park Liter, MD, [facility] 03/28/2017 3:19 PM    [place]

## 2017-03-28 NOTE — Patient Instructions (Addendum)
Medication Instructions:  Your physician recommends that you continue on your current medications as directed. Please refer to the Current Medication list given to you today.  Labwork: Your physician recommends that you have lab work today to check your complete blood cell count and Kidney function as well as sodium level and potassium.   Testing/Procedures: none  Follow-Up: Your physician recommends that you schedule a follow-up appointment in:   Any Other Special Instructions Will Be Listed Below (If Applicable).  Please note that any paperwork needing to be filled out by the provider will need to be addressed at the front desk prior to seeing the provider. Please note that any paperwork FMLA, Disability or other documents regarding health condition is subject to a $25.00 charge that must be received prior to completion of paperwork in the form of a money order or check.    If you need a refill on your cardiac medications before your next appointment, please call your pharmacy.

## 2017-03-29 LAB — CBC
HEMATOCRIT: 38.6 % (ref 34.0–46.6)
Hemoglobin: 12.7 g/dL (ref 11.1–15.9)
MCH: 26.7 pg (ref 26.6–33.0)
MCHC: 32.9 g/dL (ref 31.5–35.7)
MCV: 81 fL (ref 79–97)
PLATELETS: 276 10*3/uL (ref 150–379)
RBC: 4.75 x10E6/uL (ref 3.77–5.28)
RDW: 16.5 % — AB (ref 12.3–15.4)
WBC: 5.1 10*3/uL (ref 3.4–10.8)

## 2017-03-29 LAB — BASIC METABOLIC PANEL
BUN / CREAT RATIO: 18 (ref 12–28)
BUN: 24 mg/dL (ref 8–27)
CHLORIDE: 95 mmol/L — AB (ref 96–106)
CO2: 25 mmol/L (ref 20–29)
Calcium: 9.5 mg/dL (ref 8.7–10.3)
Creatinine, Ser: 1.35 mg/dL — ABNORMAL HIGH (ref 0.57–1.00)
GFR calc Af Amer: 42 mL/min/{1.73_m2} — ABNORMAL LOW (ref >59)
GFR calc non Af Amer: 36 mL/min/{1.73_m2} — ABNORMAL LOW (ref >59)
GLUCOSE: 125 mg/dL — AB (ref 65–99)
Potassium: 5.2 mmol/L (ref 3.5–5.2)
Sodium: 135 mmol/L (ref 134–144)

## 2017-03-31 DIAGNOSIS — E871 Hypo-osmolality and hyponatremia: Secondary | ICD-10-CM | POA: Diagnosis not present

## 2017-04-04 ENCOUNTER — Telehealth: Payer: Self-pay | Admitting: Cardiology

## 2017-04-04 NOTE — Telephone Encounter (Signed)
S/w pt who states that her son came over with his BP machine and checked her BP which is reading 170/79. She states that she checked her BP multiple times in the same arm when she obtained the higher readings with her machine. I have educated regarding the change of arms for accurate BP readings and advised that a machine error could be the problem in review of the extreme different readings within a 30 minute time frame. Pt verbalized understanding. Pt advised to recheck her BP tonight with sons machine if possible and document the reading.

## 2017-04-04 NOTE — Telephone Encounter (Signed)
Patient called this am and state that she is feeling drained and weak and BP monitor state that ther BP is 230/125.Marland Kitchen Patient will recharge her monitor but she is concerned and would like a call back.Marland Kitchen

## 2017-04-11 ENCOUNTER — Telehealth: Payer: Self-pay | Admitting: Cardiology

## 2017-04-11 NOTE — Telephone Encounter (Signed)
Patient is having 3 teeth pulled on Thursday this week and she is on Eliquis and wants to know if she needs to come off?? Please call pateint .

## 2017-04-11 NOTE — Telephone Encounter (Signed)
Ok to stop eliquis fr 24h

## 2017-04-11 NOTE — Telephone Encounter (Signed)
Pt advised of advised to hold Eliquis for 2 doses.

## 2017-04-13 DIAGNOSIS — I129 Hypertensive chronic kidney disease with stage 1 through stage 4 chronic kidney disease, or unspecified chronic kidney disease: Secondary | ICD-10-CM | POA: Diagnosis not present

## 2017-04-13 DIAGNOSIS — N183 Chronic kidney disease, stage 3 (moderate): Secondary | ICD-10-CM | POA: Diagnosis not present

## 2017-04-13 DIAGNOSIS — E871 Hypo-osmolality and hyponatremia: Secondary | ICD-10-CM | POA: Diagnosis not present

## 2017-04-13 DIAGNOSIS — Z8673 Personal history of transient ischemic attack (TIA), and cerebral infarction without residual deficits: Secondary | ICD-10-CM | POA: Diagnosis not present

## 2017-04-18 DIAGNOSIS — Z79899 Other long term (current) drug therapy: Secondary | ICD-10-CM | POA: Diagnosis not present

## 2017-05-02 ENCOUNTER — Other Ambulatory Visit: Payer: Self-pay | Admitting: Cardiology

## 2017-05-23 DIAGNOSIS — I10 Essential (primary) hypertension: Secondary | ICD-10-CM | POA: Diagnosis not present

## 2017-05-23 DIAGNOSIS — E785 Hyperlipidemia, unspecified: Secondary | ICD-10-CM | POA: Diagnosis not present

## 2017-05-23 DIAGNOSIS — N183 Chronic kidney disease, stage 3 (moderate): Secondary | ICD-10-CM | POA: Diagnosis not present

## 2017-05-23 DIAGNOSIS — Z79899 Other long term (current) drug therapy: Secondary | ICD-10-CM | POA: Diagnosis not present

## 2017-05-25 ENCOUNTER — Ambulatory Visit: Payer: Self-pay | Admitting: Neurology

## 2017-05-31 DIAGNOSIS — H353112 Nonexudative age-related macular degeneration, right eye, intermediate dry stage: Secondary | ICD-10-CM | POA: Diagnosis not present

## 2017-05-31 DIAGNOSIS — H524 Presbyopia: Secondary | ICD-10-CM | POA: Diagnosis not present

## 2017-06-01 ENCOUNTER — Telehealth: Payer: Self-pay | Admitting: Cardiology

## 2017-06-01 DIAGNOSIS — I10 Essential (primary) hypertension: Secondary | ICD-10-CM | POA: Diagnosis not present

## 2017-06-01 DIAGNOSIS — I119 Hypertensive heart disease without heart failure: Secondary | ICD-10-CM | POA: Diagnosis not present

## 2017-06-01 DIAGNOSIS — I160 Hypertensive urgency: Secondary | ICD-10-CM | POA: Diagnosis not present

## 2017-06-01 DIAGNOSIS — E7800 Pure hypercholesterolemia, unspecified: Secondary | ICD-10-CM | POA: Diagnosis not present

## 2017-06-01 NOTE — Telephone Encounter (Signed)
Patient states she went to primary care and got her blood pressure checked. It was 207/76 in right arm and 208/72 in left arm. They told her to call us. We recommended she go to the ER

## 2017-06-01 NOTE — Telephone Encounter (Signed)
Patient is currently being seen at [facility] ED

## 2017-06-06 DIAGNOSIS — I10 Essential (primary) hypertension: Secondary | ICD-10-CM | POA: Diagnosis not present

## 2017-06-13 DIAGNOSIS — I10 Essential (primary) hypertension: Secondary | ICD-10-CM | POA: Diagnosis not present

## 2017-06-20 ENCOUNTER — Other Ambulatory Visit: Payer: Self-pay

## 2017-06-20 NOTE — Patient Outreach (Signed)
Telephone outreach to patient to obtain mRS was successfully completed. mRS = 0 

## 2017-06-28 ENCOUNTER — Encounter: Payer: Self-pay | Admitting: Cardiology

## 2017-06-28 ENCOUNTER — Ambulatory Visit (INDEPENDENT_AMBULATORY_CARE_PROVIDER_SITE_OTHER): Payer: PPO | Admitting: Cardiology

## 2017-06-28 VITALS — BP 170/100 | HR 75 | Ht 63.5 in | Wt 142.4 lb

## 2017-06-28 DIAGNOSIS — I480 Paroxysmal atrial fibrillation: Secondary | ICD-10-CM | POA: Diagnosis not present

## 2017-06-28 DIAGNOSIS — I639 Cerebral infarction, unspecified: Secondary | ICD-10-CM

## 2017-06-28 DIAGNOSIS — I10 Essential (primary) hypertension: Secondary | ICD-10-CM

## 2017-06-28 MED ORDER — HYDRALAZINE HCL 50 MG PO TABS: 50.0000 mg | ORAL_TABLET | Freq: Three times a day (TID) | ORAL | 3 refills | Status: DC

## 2017-06-28 NOTE — Patient Instructions (Signed)
Medication Instructions:  Your physician has recommended you make the following change in your medication:  INCREASE hydralazine to 50 mg three times daily.  Labwork: Your physician recommends that you return for lab work in: today. BMP  Testing/Procedures: None  Follow-Up: Your physician recommends that you schedule a follow-up appointment in: 3 weeks.  Any Other Special Instructions Will Be Listed Below (If Applicable).     If you need a refill on your cardiac medications before your next appointment, please call your pharmacy.

## 2017-06-28 NOTE — Progress Notes (Signed)
Cardiology Office Note:    Date:  06/28/2017   ID:  Sherry Hess, DOB [birth year], MRN [id number]  PCP:  Ernestene Kiel, MD  Cardiologist:  [name], MD    Referring MD: Ernestene Kiel, MD   Chief Complaint  Patient presents with  . 3 month follow up    Pt has noticed her BP has been elevated, has gone to [facility] in regards to this   Doing well but blood pressure seems to be high she was recently in the hospital because of high blood pressure  History of Present Illness:    Sherry Hess is a 84 y.o. female with essential hypertension.  Still have difficulty controlling her blood pressure.  I will ask you to increase dose of hydralazine to 50 mg 3 times a day.  We will check a Chem-7 to see if I add some diuretic.  I want her to check blood pressure on the regular basis and then I will see her back in my office in about 3 weeks.  Past Medical History:  Diagnosis Date  . Anxiety   . High cholesterol   . Hypertension   . TIA (transient ischemic attack)     Past Surgical History:  Procedure Laterality Date  . COLONOSCOPY N/A 03/11/2017   Procedure: COLONOSCOPY;  Surgeon: Gatha Mayer, MD;  Location: [facility] ENDOSCOPY;  Service: Endoscopy;  Laterality: N/A;  . ESOPHAGOGASTRODUODENOSCOPY N/A 03/11/2017   Procedure: ESOPHAGOGASTRODUODENOSCOPY (EGD);  Surgeon: Gatha Mayer, MD;  Location: [facility] ENDOSCOPY;  Service: Endoscopy;  Laterality: N/A;  . TONSILLECTOMY    . VESICOVAGINAL FISTULA CLOSURE W/ TAH      Current Medications: Current Meds  Medication Sig  . apixaban (ELIQUIS) 2.5 MG TABS tablet Take 1 tablet (2.5 mg total) by mouth 2 (two) times daily.  Marland Kitchen atorvastatin (LIPITOR) 20 MG tablet Take 20 mg by mouth daily.  . carvedilol (COREG) 12.5 MG tablet Take 12.5 mg by mouth 2 (two) times daily.  . Ferrous Sulfate (CVS SLOW RELEASE IRON PO) Take 1 tablet daily by mouth.  . Fish Oil-Cholecalciferol (FISH OIL + D3) 1000-1000 MG-UNIT CAPS Take  by mouth.  Marland Kitchen FLUoxetine (PROZAC) 10 MG tablet Take 10 mg by mouth daily.  . hydrALAZINE (APRESOLINE) 25 MG tablet Take 1 tablet (25 mg total) by mouth 3 (three) times daily. (Patient taking differently: Take 25 mg by mouth 5 (five) times daily. )  . Multiple Minerals-Vitamins (CALCIUM & VIT D3 BONE HEALTH PO) Take by mouth.  Marland Kitchen omeprazole (PRILOSEC) 20 MG capsule Take 20 mg by mouth daily.   . sodium bicarbonate 650 MG tablet Take 650 mg 2 (two) times daily by mouth.  . telmisartan (MICARDIS) 80 MG tablet TAKE 1/2 TABLET BY MOUTH ONCE DAILY.     Allergies:   Adhesive [tape]; Clonidine derivatives; Minoxidil; and Norvasc [amlodipine besylate]   Social History   Socioeconomic History  . Marital status: Widowed    Spouse name: None  . Number of children: None  . Years of education: None  . Highest education level: None  Social Needs  . Financial resource strain: None  . Food insecurity - worry: None  . Food insecurity - inability: None  . Transportation needs - medical: None  . Transportation needs - non-medical: None  Occupational History  . None  Tobacco Use  . Smoking status: Never Smoker  . Smokeless tobacco: Never Used  Substance and Sexual Activity  . Alcohol use: No  . Drug use: No  .  Sexual activity: None  Other Topics Concern  . None  Social History Narrative  . None     Family History: [name] family history includes Cancer in her brother and father; Stroke in her mother. There is no history of Ataxia, Chorea, Dementia, Mental retardation, Migraines, Multiple sclerosis, Neurofibromatosis, Neuropathy, Parkinsonism, or Seizures. ROS:   Please see the history of present illness.    All 14 point review of systems negative except as described per history of present illness  EKGs/Labs/Other Studies Reviewed:      Recent Labs: 03/08/2017: ALT 12 03/10/2017: TSH 2.596 03/28/2017: BUN 24; Creatinine, Ser 1.35; Hemoglobin 12.7; Platelets 276; Potassium 5.2;  Sodium 135  Recent Lipid Panel    Component Value Date/Time   CHOL 117 03/09/2017 0226   TRIG 50 03/09/2017 0226   HDL 36 (L) 03/09/2017 0226   CHOLHDL 3.3 03/09/2017 0226   VLDL 10 03/09/2017 0226   LDLCALC 71 03/09/2017 0226    Physical Exam:    VS:  BP (!) 170/100 (BP Location: Right Arm, Patient Position: Sitting, Cuff Size: Normal)   Pulse 75   Ht 5' 3.5" (1.613 m)   Wt 142 lb 6.4 oz (64.6 kg)   SpO2 96%   BMI 24.83 kg/m     Wt Readings from Last 3 Encounters:  06/28/17 142 lb 6.4 oz (64.6 kg)  03/28/17 137 lb (62.1 kg)  03/08/17 131 lb 6.3 oz (59.6 kg)     GEN:  Well nourished, well developed in no acute distress HEENT: Normal NECK: No JVD; No carotid bruits LYMPHATICS: No lymphadenopathy CARDIAC: RRR, no murmurs, no rubs, no gallops RESPIRATORY:  Clear to auscultation without rales, wheezing or rhonchi  ABDOMEN: Soft, non-tender, non-distended MUSCULOSKELETAL:  No edema; No deformity  SKIN: Warm and dry LOWER EXTREMITIES: no swelling NEUROLOGIC:  Alert and oriented x 3 PSYCHIATRIC:  Normal affect   ASSESSMENT:    1. Cerebrovascular accident (CVA), unspecified mechanism ([place])   2. Essential hypertension   3. Paroxysmal atrial fibrillation (HCC)    PLAN:    In order of problems listed above:  1. CVA no new problems. 2. Essential hypertension: Plan as outlined above 3. Paroxysmal atrial fibrillation: Anticoagulated which I will continue no palpitations.   Medication Adjustments/Labs and Tests Ordered: Current medicines are reviewed at length with the patient today.  Concerns regarding medicines are outlined above.  No orders of the defined types were placed in this encounter.  Medication changes: No orders of the defined types were placed in this encounter.   Signed, Park Liter, MD, [facility] 06/28/2017 5:13 PM    [facility]

## 2017-06-29 ENCOUNTER — Telehealth: Payer: Self-pay

## 2017-06-29 DIAGNOSIS — I10 Essential (primary) hypertension: Secondary | ICD-10-CM

## 2017-06-29 LAB — BASIC METABOLIC PANEL
BUN / CREAT RATIO: 18 (ref 12–28)
BUN: 25 mg/dL (ref 8–27)
CALCIUM: 9.5 mg/dL (ref 8.7–10.3)
CHLORIDE: 95 mmol/L — AB (ref 96–106)
CO2: 26 mmol/L (ref 20–29)
Creatinine, Ser: 1.38 mg/dL — ABNORMAL HIGH (ref 0.57–1.00)
GFR calc non Af Amer: 35 mL/min/{1.73_m2} — ABNORMAL LOW (ref >59)
GFR, EST AFRICAN AMERICAN: 40 mL/min/{1.73_m2} — AB (ref >59)
GLUCOSE: 93 mg/dL (ref 65–99)
POTASSIUM: 4.9 mmol/L (ref 3.5–5.2)
Sodium: 135 mmol/L (ref 134–144)

## 2017-06-29 MED ORDER — HYDROCHLOROTHIAZIDE 12.5 MG PO CAPS: 12.5000 mg | ORAL_CAPSULE | Freq: Every day | ORAL | 3 refills | Status: DC

## 2017-06-29 NOTE — Telephone Encounter (Signed)
-----   Message from Park Liter, MD sent at 06/29/2017  9:24 AM EST ----- Add HCTZ 12.5 mg po qd chem7 in 1 week

## 2017-06-29 NOTE — Telephone Encounter (Signed)
Patient informed of results. Advised patient to start hydrochlorothiazide 12.5 mg daily. Patient verbalized understanding. Advised patient to have lab work checked next week. Patient is going to the [facility] in [place] next week. Patient verbalized understanding. No further questions.

## 2017-06-30 LAB — BASIC METABOLIC PANEL
BUN / CREAT RATIO: 19 (ref 12–28)
BUN: 28 mg/dL — ABNORMAL HIGH (ref 8–27)
CALCIUM: 9.8 mg/dL (ref 8.7–10.3)
CHLORIDE: 99 mmol/L (ref 96–106)
CO2: 22 mmol/L (ref 20–29)
Creatinine, Ser: 1.49 mg/dL — ABNORMAL HIGH (ref 0.57–1.00)
GFR calc non Af Amer: 32 mL/min/{1.73_m2} — ABNORMAL LOW (ref >59)
GFR, EST AFRICAN AMERICAN: 37 mL/min/{1.73_m2} — AB (ref >59)
Glucose: 114 mg/dL — ABNORMAL HIGH (ref 65–99)
POTASSIUM: 4.5 mmol/L (ref 3.5–5.2)
SODIUM: 137 mmol/L (ref 134–144)

## 2017-07-06 ENCOUNTER — Encounter: Payer: Self-pay | Admitting: Cardiology

## 2017-07-06 ENCOUNTER — Ambulatory Visit (INDEPENDENT_AMBULATORY_CARE_PROVIDER_SITE_OTHER): Payer: PPO | Admitting: Cardiology

## 2017-07-06 VITALS — BP 152/80 | HR 65 | Ht 63.5 in | Wt 142.0 lb

## 2017-07-06 DIAGNOSIS — I10 Essential (primary) hypertension: Secondary | ICD-10-CM

## 2017-07-06 DIAGNOSIS — R55 Syncope and collapse: Secondary | ICD-10-CM

## 2017-07-06 DIAGNOSIS — I480 Paroxysmal atrial fibrillation: Secondary | ICD-10-CM

## 2017-07-06 MED ORDER — DILTIAZEM HCL ER COATED BEADS 120 MG PO CP24: 120.0000 mg | ORAL_CAPSULE | Freq: Every day | ORAL | 3 refills | Status: DC

## 2017-07-06 NOTE — Patient Instructions (Signed)
Medication Instructions:  Your physician has recommended you make the following change in your medication:  Start Cardizem 120 mg, 1 tablet daily  Labwork: Your physician recommends that you have lab work today: BMP  Testing/Procedures: None Ordered  Follow-Up: Your physician recommends that you schedule a follow-up appointment in: 1 month follow up with Dr. Agustin Cree   Any Other Special Instructions Will Be Listed Below (If Applicable).     If you need a refill on your cardiac medications before your next appointment, please call your pharmacy.

## 2017-07-06 NOTE — Progress Notes (Signed)
Cardiology Office Note:    Date:  07/06/2017   ID:  [name], DOB 1931-03-14, MRN [id number]  PCP:  Ernestene Kiel, MD  Cardiologist:  [name], MD    Referring MD: Ernestene Kiel, MD   Chief Complaint  Patient presents with  . Follow-up  High blood pressure  History of Present Illness:    Sherry Hess is a 84 y.o. female with hypertension that appears to be difficult to control.  Overall she says she is doing well she can do activity daily living with no difficulties.  Does have any chest pain tightness squeezing pressure burning chest.  Past Medical History:  Diagnosis Date  . Anxiety   . High cholesterol   . Hypertension   . TIA (transient ischemic attack)     Past Surgical History:  Procedure Laterality Date  . COLONOSCOPY N/A 03/11/2017   Procedure: COLONOSCOPY;  Surgeon: Gatha Mayer, MD;  Location: [facility] ENDOSCOPY;  Service: Endoscopy;  Laterality: N/A;  . ESOPHAGOGASTRODUODENOSCOPY N/A 03/11/2017   Procedure: ESOPHAGOGASTRODUODENOSCOPY (EGD);  Surgeon: Gatha Mayer, MD;  Location: [facility] ENDOSCOPY;  Service: Endoscopy;  Laterality: N/A;  . TONSILLECTOMY    . VESICOVAGINAL FISTULA CLOSURE W/ TAH      Current Medications: Current Meds  Medication Sig  . apixaban (ELIQUIS) 2.5 MG TABS tablet Take 1 tablet (2.5 mg total) by mouth 2 (two) times daily.  Marland Kitchen atorvastatin (LIPITOR) 20 MG tablet Take 20 mg by mouth daily.  . carvedilol (COREG) 12.5 MG tablet Take 12.5 mg by mouth 2 (two) times daily.  . Ferrous Sulfate (CVS SLOW RELEASE IRON PO) Take 1 tablet daily by mouth.  . Fish Oil-Cholecalciferol (FISH OIL + D3) 1000-1000 MG-UNIT CAPS Take by mouth.  Marland Kitchen FLUoxetine (PROZAC) 10 MG tablet Take 10 mg by mouth daily.  . hydrALAZINE (APRESOLINE) 50 MG tablet Take 1 tablet (50 mg total) by mouth 3 (three) times daily.  . hydrochlorothiazide (MICROZIDE) 12.5 MG capsule Take 1 capsule (12.5 mg total) by mouth daily.  Marland Kitchen omeprazole (PRILOSEC)  20 MG capsule Take 20 mg by mouth daily.   . sodium bicarbonate 650 MG tablet Take 650 mg 2 (two) times daily by mouth.  . telmisartan (MICARDIS) 80 MG tablet TAKE 1/2 TABLET BY MOUTH ONCE DAILY.     Allergies:   Adhesive [tape]; Clonidine derivatives; Minoxidil; and Norvasc [amlodipine besylate]   Social History   Socioeconomic History  . Marital status: Widowed    Spouse name: None  . Number of children: None  . Years of education: None  . Highest education level: None  Social Needs  . Financial resource strain: None  . Food insecurity - worry: None  . Food insecurity - inability: None  . Transportation needs - medical: None  . Transportation needs - non-medical: None  Occupational History  . None  Tobacco Use  . Smoking status: Never Smoker  . Smokeless tobacco: Never Used  Substance and Sexual Activity  . Alcohol use: No  . Drug use: No  . Sexual activity: None  Other Topics Concern  . None  Social History Narrative  . None     Family History: [name] family history includes Cancer in her brother and father; Stroke in her mother. There is no history of Ataxia, Chorea, Dementia, Mental retardation, Migraines, Multiple sclerosis, Neurofibromatosis, Neuropathy, Parkinsonism, or Seizures. ROS:   Please see the history of present illness.    All 14 point review of systems negative except as described per history  of present illness  EKGs/Labs/Other Studies Reviewed:      Recent Labs: 03/08/2017: ALT 12 03/10/2017: TSH 2.596 03/28/2017: Hemoglobin 12.7; Platelets 276 06/29/2017: BUN 28; Creatinine, Ser 1.49; Potassium 4.5; Sodium 137  Recent Lipid Panel    Component Value Date/Time   CHOL 117 03/09/2017 0226   TRIG 50 03/09/2017 0226   HDL 36 (L) 03/09/2017 0226   CHOLHDL 3.3 03/09/2017 0226   VLDL 10 03/09/2017 0226   LDLCALC 71 03/09/2017 0226    Physical Exam:    VS:  BP (!) 152/80 (BP Location: Left Arm, Patient Position: Sitting, Cuff Size:  Normal)   Pulse 65   Ht 5' 3.5" (1.613 m)   Wt 142 lb (64.4 kg)   SpO2 98%   BMI 24.76 kg/m     Wt Readings from Last 3 Encounters:  07/06/17 142 lb (64.4 kg)  06/28/17 142 lb 6.4 oz (64.6 kg)  03/28/17 137 lb (62.1 kg)     GEN:  Well nourished, well developed in no acute distress HEENT: Normal NECK: No JVD; No carotid bruits LYMPHATICS: No lymphadenopathy CARDIAC: RRR, no murmurs, no rubs, no gallops RESPIRATORY:  Clear to auscultation without rales, wheezing or rhonchi  ABDOMEN: Soft, non-tender, non-distended MUSCULOSKELETAL:  No edema; No deformity  SKIN: Warm and dry LOWER EXTREMITIES: no swelling NEUROLOGIC:  Alert and oriented x 3 PSYCHIATRIC:  Normal affect   ASSESSMENT:    1. Essential hypertension   2. Paroxysmal atrial fibrillation (HCC)   3. Syncope, unspecified syncope type    PLAN:    In order of problems listed above:  1. Essential hypertension: I will ask her to have EKG done if EKG will be fine I will initiate Cardizem.  Blood pressure in the office is quite reasonable but at home is still elevated.  See her back in my office in about a month or 2 to recheck of her blood pressure 2. Paroxysmal atrial fibrillation: Denies having any.  Anticoagulated which I will continue 3. Dyslipidemia: We will continue with atorvastatin    Medication Adjustments/Labs and Tests Ordered: Current medicines are reviewed at length with the patient today.  Concerns regarding medicines are outlined above.  No orders of the defined types were placed in this encounter.  Medication changes: No orders of the defined types were placed in this encounter.   Signed, Park Liter, MD, [facility] 07/06/2017 3:37 PM    [facility]

## 2017-07-07 LAB — BASIC METABOLIC PANEL
BUN / CREAT RATIO: 22 (ref 12–28)
BUN: 33 mg/dL — AB (ref 8–27)
CO2: 25 mmol/L (ref 20–29)
CREATININE: 1.47 mg/dL — AB (ref 0.57–1.00)
Calcium: 9.5 mg/dL (ref 8.7–10.3)
Chloride: 92 mmol/L — ABNORMAL LOW (ref 96–106)
GFR calc Af Amer: 38 mL/min/{1.73_m2} — ABNORMAL LOW (ref >59)
GFR calc non Af Amer: 33 mL/min/{1.73_m2} — ABNORMAL LOW (ref >59)
Glucose: 102 mg/dL — ABNORMAL HIGH (ref 65–99)
Potassium: 4.3 mmol/L (ref 3.5–5.2)
Sodium: 132 mmol/L — ABNORMAL LOW (ref 134–144)

## 2017-07-08 NOTE — Addendum Note (Signed)
Addended by: Aleatha Borer on: 07/08/2017 02:17 PM   Modules accepted: Orders

## 2017-07-12 DIAGNOSIS — I69311 Memory deficit following cerebral infarction: Secondary | ICD-10-CM | POA: Diagnosis not present

## 2017-07-12 DIAGNOSIS — F411 Generalized anxiety disorder: Secondary | ICD-10-CM | POA: Diagnosis not present

## 2017-07-12 DIAGNOSIS — I10 Essential (primary) hypertension: Secondary | ICD-10-CM | POA: Diagnosis not present

## 2017-07-12 DIAGNOSIS — N183 Chronic kidney disease, stage 3 (moderate): Secondary | ICD-10-CM | POA: Diagnosis not present

## 2017-07-12 DIAGNOSIS — E871 Hypo-osmolality and hyponatremia: Secondary | ICD-10-CM | POA: Diagnosis not present

## 2017-07-12 DIAGNOSIS — E782 Mixed hyperlipidemia: Secondary | ICD-10-CM | POA: Diagnosis not present

## 2017-07-12 DIAGNOSIS — K219 Gastro-esophageal reflux disease without esophagitis: Secondary | ICD-10-CM | POA: Diagnosis not present

## 2017-07-12 DIAGNOSIS — D631 Anemia in chronic kidney disease: Secondary | ICD-10-CM | POA: Diagnosis not present

## 2017-07-26 ENCOUNTER — Ambulatory Visit: Payer: PPO | Admitting: Cardiology

## 2017-08-03 ENCOUNTER — Ambulatory Visit (INDEPENDENT_AMBULATORY_CARE_PROVIDER_SITE_OTHER): Payer: PPO | Admitting: Cardiology

## 2017-08-03 VITALS — BP 140/76 | HR 61 | Wt 146.8 lb

## 2017-08-03 DIAGNOSIS — Z8673 Personal history of transient ischemic attack (TIA), and cerebral infarction without residual deficits: Secondary | ICD-10-CM

## 2017-08-03 DIAGNOSIS — I480 Paroxysmal atrial fibrillation: Secondary | ICD-10-CM | POA: Diagnosis not present

## 2017-08-03 DIAGNOSIS — I10 Essential (primary) hypertension: Secondary | ICD-10-CM

## 2017-08-03 MED ORDER — DILTIAZEM HCL ER COATED BEADS 180 MG PO CP24: 180.0000 mg | ORAL_CAPSULE | Freq: Every day | ORAL | 3 refills | Status: DC

## 2017-08-03 NOTE — Addendum Note (Signed)
Addended by: Austin Miles on: 08/03/2017 04:25 PM   Modules accepted: Orders

## 2017-08-03 NOTE — Patient Instructions (Signed)
Medication Instructions:  Your physician has recommended you make the following change in your medication:  INCREASE diltiazem (cardizem) 180 mg daily  Labwork: None  Testing/Procedures: You had an EKG today.   Follow-Up: Your physician wants you to follow-up in: 4 months. You will receive a reminder letter in the mail two months in advance. If you don't receive a letter, please call our office to schedule the follow-up appointment.   Any Other Special Instructions Will Be Listed Below (If Applicable).     If you need a refill on your cardiac medications before your next appointment, please call your pharmacy.

## 2017-08-03 NOTE — Progress Notes (Signed)
Cardiology Office Note:    Date:  08/03/2017   ID:  [name], DOB April 27, 1931, MRN [id number]  PCP:  [name], MD  Cardiologist:  [name], MD    Referring MD: Ernestene Kiel, MD   Chief Complaint  Patient presents with  . 1 month follow up  Doing well  History of Present Illness:    Sherry Hess is a 84 y.o. female with paroxysmal atrial fibrillation.  Overall doing well denies have any chest pain tightness squeezing pressure burning chest no palpitations.  Another issue is her blood pressure which seems to be difficult.  Still elevated she brought blood pressure numbers from home and those are still elevated.  I asked her to have EKG done if EKG is fine we will increase dose of Cardizem to 180 daily.  Past Medical History:  Diagnosis Date  . Anxiety   . High cholesterol   . Hypertension   . TIA (transient ischemic attack)     Past Surgical History:  Procedure Laterality Date  . COLONOSCOPY N/A 03/11/2017   Procedure: COLONOSCOPY;  Surgeon: Gatha Mayer, MD;  Location: [facility] ENDOSCOPY;  Service: Endoscopy;  Laterality: N/A;  . ESOPHAGOGASTRODUODENOSCOPY N/A 03/11/2017   Procedure: ESOPHAGOGASTRODUODENOSCOPY (EGD);  Surgeon: Gatha Mayer, MD;  Location: [facility] ENDOSCOPY;  Service: Endoscopy;  Laterality: N/A;  . TONSILLECTOMY    . VESICOVAGINAL FISTULA CLOSURE W/ TAH      Current Medications: Current Meds  Medication Sig  . apixaban (ELIQUIS) 2.5 MG TABS tablet Take 1 tablet (2.5 mg total) by mouth 2 (two) times daily.  Marland Kitchen atorvastatin (LIPITOR) 20 MG tablet Take 20 mg by mouth daily.  . carvedilol (COREG) 12.5 MG tablet Take 12.5 mg by mouth 2 (two) times daily.  Marland Kitchen diltiazem (CARDIZEM CD) 120 MG 24 hr capsule Take 1 capsule (120 mg total) by mouth daily.  . Ferrous Sulfate (CVS SLOW RELEASE IRON PO) Take 1 tablet daily by mouth.  . Fish Oil-Cholecalciferol (FISH OIL + D3) 1000-1000 MG-UNIT CAPS Take by mouth.  Marland Kitchen FLUoxetine (PROZAC) 10 MG  tablet Take 10 mg by mouth daily.  . hydrALAZINE (APRESOLINE) 50 MG tablet Take 1 tablet (50 mg total) by mouth 3 (three) times daily.  . hydrochlorothiazide (MICROZIDE) 12.5 MG capsule Take 1 capsule (12.5 mg total) by mouth daily.  Marland Kitchen omeprazole (PRILOSEC) 20 MG capsule Take 20 mg by mouth daily.   . sodium bicarbonate 650 MG tablet Take 650 mg 2 (two) times daily by mouth.  . telmisartan (MICARDIS) 80 MG tablet TAKE 1/2 TABLET BY MOUTH ONCE DAILY.     Allergies:   Adhesive [tape]; Clonidine derivatives; Minoxidil; and Norvasc [amlodipine besylate]   Social History   Socioeconomic History  . Marital status: Widowed    Spouse name: Not on file  . Number of children: Not on file  . Years of education: Not on file  . Highest education level: Not on file  Social Needs  . Financial resource strain: Not on file  . Food insecurity - worry: Not on file  . Food insecurity - inability: Not on file  . Transportation needs - medical: Not on file  . Transportation needs - non-medical: Not on file  Occupational History  . Not on file  Tobacco Use  . Smoking status: Never Smoker  . Smokeless tobacco: Never Used  Substance and Sexual Activity  . Alcohol use: No  . Drug use: No  . Sexual activity: Not on file  Other Topics Concern  .  Not on file  Social History Narrative  . Not on file     Family History: [name] family history includes Cancer in her brother and father; Stroke in her mother. There is no history of Ataxia, Chorea, Dementia, Mental retardation, Migraines, Multiple sclerosis, Neurofibromatosis, Neuropathy, Parkinsonism, or Seizures. ROS:   Please see the history of present illness.    All 14 point review of systems negative except as described per history of present illness  EKGs/Labs/Other Studies Reviewed:      Recent Labs: 03/08/2017: ALT 12 03/10/2017: TSH 2.596 03/28/2017: Hemoglobin 12.7; Platelets 276 07/06/2017: BUN 33; Creatinine, Ser 1.47; Potassium  4.3; Sodium 132  Recent Lipid Panel    Component Value Date/Time   CHOL 117 03/09/2017 0226   TRIG 50 03/09/2017 0226   HDL 36 (L) 03/09/2017 0226   CHOLHDL 3.3 03/09/2017 0226   VLDL 10 03/09/2017 0226   LDLCALC 71 03/09/2017 0226    Physical Exam:    VS:  BP 140/76   Pulse 61   Wt 146 lb 12.8 oz (66.6 kg)   SpO2 97%   BMI 25.60 kg/m     Wt Readings from Last 3 Encounters:  08/03/17 146 lb 12.8 oz (66.6 kg)  07/06/17 142 lb (64.4 kg)  06/28/17 142 lb 6.4 oz (64.6 kg)     GEN:  Well nourished, well developed in no acute distress HEENT: Normal NECK: No JVD; No carotid bruits LYMPHATICS: No lymphadenopathy CARDIAC: RRR, no murmurs, no rubs, no gallops RESPIRATORY:  Clear to auscultation without rales, wheezing or rhonchi  ABDOMEN: Soft, non-tender, non-distended MUSCULOSKELETAL:  No edema; No deformity  SKIN: Warm and dry LOWER EXTREMITIES: no swelling NEUROLOGIC:  Alert and oriented x 3 PSYCHIATRIC:  Normal affect   ASSESSMENT:    1. Essential hypertension   2. Paroxysmal atrial fibrillation (HCC)   3. Hx of transient ischemic attack (TIA)    PLAN:    In order of problems listed above:  1. Essential hypertension: Plan as outlined above we will try to increase dose of Cardizem. 2. Paroxysmal atrial fibrillation: We will continue anticoagulation 3. History of TIA.  Doing well from that point review no new issues   Medication Adjustments/Labs and Tests Ordered: Current medicines are reviewed at length with the patient today.  Concerns regarding medicines are outlined above.  No orders of the defined types were placed in this encounter.  Medication changes: No orders of the defined types were placed in this encounter.   Signed, Park Liter, MD, [facility] 08/03/2017 3:46 PM    [place]

## 2017-08-08 DIAGNOSIS — E871 Hypo-osmolality and hyponatremia: Secondary | ICD-10-CM | POA: Diagnosis not present

## 2017-08-08 DIAGNOSIS — D631 Anemia in chronic kidney disease: Secondary | ICD-10-CM | POA: Diagnosis not present

## 2017-08-08 DIAGNOSIS — I10 Essential (primary) hypertension: Secondary | ICD-10-CM | POA: Diagnosis not present

## 2017-08-08 DIAGNOSIS — E782 Mixed hyperlipidemia: Secondary | ICD-10-CM | POA: Diagnosis not present

## 2017-08-09 ENCOUNTER — Telehealth: Payer: Self-pay | Admitting: Cardiology

## 2017-08-09 NOTE — Telephone Encounter (Signed)
Patient called and states that her PCP asked her to stop taking the Fluid Pill.  Patient does not feel comfortable stopping with out you telling her.  Please call her and if she is not at home leave a message on the phone.

## 2017-08-10 NOTE — Telephone Encounter (Signed)
Patient states that she does not recall why her PCP told her to stop taking hydrochlorothiazide 12.5 mg daily. She takes her blood pressure a few times every week. Her last recorded blood pressure was on 08/08/17: 154/77. Patient states she has still been taking this medication because she wanted to double check with Dr. Agustin Cree before stopping. Dr. Agustin Cree advised patient to stop medication and see how she responds. Encouraged her to call me back if she has any questions or concerns. Patient verbalized understanding. No further questions.

## 2017-08-18 ENCOUNTER — Ambulatory Visit: Payer: PPO | Admitting: Neurology

## 2017-08-23 ENCOUNTER — Telehealth: Payer: Self-pay | Admitting: *Deleted

## 2017-08-23 DIAGNOSIS — I10 Essential (primary) hypertension: Secondary | ICD-10-CM

## 2017-08-23 MED ORDER — HYDRALAZINE HCL 50 MG PO TABS: 75.0000 mg | ORAL_TABLET | Freq: Three times a day (TID) | ORAL | 3 refills | Status: DC

## 2017-08-23 NOTE — Telephone Encounter (Signed)
Patient called about elevated blood pressure. Patient states her blood pressure has been running 170-180/70-80s since Dr. Tobie Poet discontinued hydrochlorothiazide 12.5 mg daily. Patient also reports increased leg swelling. Patient couldn't recall why Dr. Tobie Poet stopped this medication. Contacted Dr. Alyse Low office and found that the reason this medication was due to worsening of kidney function and hyponatremia. Dr. Agustin Cree advised hydralazine be increased from 50 mg to 75 mg three times daily. Advised patient to elevated legs throughout the day. Patient verbalized understanding. No further questions. Advised her to call the office if her blood pressure readings did not improve. Refill for hydralazine sent to [facility].

## 2017-11-11 DIAGNOSIS — N185 Chronic kidney disease, stage 5: Secondary | ICD-10-CM | POA: Diagnosis not present

## 2017-11-11 DIAGNOSIS — E782 Mixed hyperlipidemia: Secondary | ICD-10-CM | POA: Diagnosis not present

## 2017-11-11 DIAGNOSIS — R600 Localized edema: Secondary | ICD-10-CM | POA: Diagnosis not present

## 2017-11-11 DIAGNOSIS — F5102 Adjustment insomnia: Secondary | ICD-10-CM | POA: Diagnosis not present

## 2017-11-11 DIAGNOSIS — E871 Hypo-osmolality and hyponatremia: Secondary | ICD-10-CM | POA: Diagnosis not present

## 2017-11-11 DIAGNOSIS — I10 Essential (primary) hypertension: Secondary | ICD-10-CM | POA: Diagnosis not present

## 2017-11-11 DIAGNOSIS — I480 Paroxysmal atrial fibrillation: Secondary | ICD-10-CM | POA: Diagnosis not present

## 2017-11-15 ENCOUNTER — Telehealth: Payer: Self-pay | Admitting: Cardiology

## 2017-11-15 NOTE — Telephone Encounter (Signed)
Patient called stating that her recent labs completed at Dr. Alyse Low office reflected increased crt. Called Dr. Alyse Low office and requested that these be sent to the office for Dr. Agustin Cree to review.

## 2017-11-15 NOTE — Telephone Encounter (Signed)
Has questions about her hydrochlorothiazide

## 2017-11-16 NOTE — Telephone Encounter (Signed)
Labs reviewed by Dr. Agustin Cree.

## 2017-11-21 ENCOUNTER — Telehealth: Payer: Self-pay | Admitting: Cardiology

## 2017-11-21 NOTE — Telephone Encounter (Signed)
Patient called stating that her nephrologist no longer wishes for her to take micardis due to diminished renal function. Patient is hesitant and wishes to have your opinion.

## 2017-11-21 NOTE — Telephone Encounter (Signed)
Patient called and concerned that her Kidney dr called and asked her to stop her Mycarditis.. [name] concerned please call her back.

## 2017-11-21 NOTE — Telephone Encounter (Signed)
Left voicemail for the patient to call the office to discuss. 

## 2017-11-22 NOTE — Telephone Encounter (Signed)
Informed the patient that she may stop her micardis.

## 2017-11-22 NOTE — Telephone Encounter (Signed)
ok to stop Micardis

## 2017-11-28 DIAGNOSIS — H26493 Other secondary cataract, bilateral: Secondary | ICD-10-CM | POA: Diagnosis not present

## 2017-11-28 DIAGNOSIS — H353112 Nonexudative age-related macular degeneration, right eye, intermediate dry stage: Secondary | ICD-10-CM | POA: Diagnosis not present

## 2017-11-30 ENCOUNTER — Telehealth: Payer: Self-pay | Admitting: Cardiology

## 2017-11-30 ENCOUNTER — Other Ambulatory Visit: Payer: Self-pay

## 2017-11-30 DIAGNOSIS — I10 Essential (primary) hypertension: Secondary | ICD-10-CM

## 2017-11-30 MED ORDER — HYDRALAZINE HCL 50 MG PO TABS: 75.0000 mg | ORAL_TABLET | Freq: Three times a day (TID) | ORAL | 2 refills | Status: DC

## 2017-11-30 NOTE — Telephone Encounter (Signed)
Med refill to accommodate increased dose has been sent.

## 2017-11-30 NOTE — Telephone Encounter (Signed)
°*  STAT* If patient is at the pharmacy, call can be transferred to refill team.   1. Which medications need to be refilled? (please list name of each medication and dose if known) Hydralazine HCL 50 mg takes 1 tablet 1/2 3 times daily  2. Which pharmacy/location (including street and city if local pharmacy) is medication to be sent to? [facility]  3. Do they need a 30 day or 90 day supply?  Pt has ran out due to new directions on meds please call in with new dosage.

## 2017-12-15 ENCOUNTER — Encounter: Payer: Self-pay | Admitting: Cardiology

## 2017-12-15 ENCOUNTER — Ambulatory Visit (INDEPENDENT_AMBULATORY_CARE_PROVIDER_SITE_OTHER): Payer: PPO | Admitting: Cardiology

## 2017-12-15 VITALS — BP 142/68 | HR 61 | Ht 63.5 in | Wt 145.0 lb

## 2017-12-15 DIAGNOSIS — I480 Paroxysmal atrial fibrillation: Secondary | ICD-10-CM

## 2017-12-15 DIAGNOSIS — I10 Essential (primary) hypertension: Secondary | ICD-10-CM | POA: Diagnosis not present

## 2017-12-15 DIAGNOSIS — R079 Chest pain, unspecified: Secondary | ICD-10-CM | POA: Diagnosis not present

## 2017-12-15 NOTE — Progress Notes (Signed)
Cardiology Office Note:    Date:  12/15/2017   ID:  Sherry Hess, DOB [birth year], MRN [id number]  PCP:  [name], MD  Cardiologist:  [name], MD    Referring MD: [name], MD   No chief complaint on file. Doing well  History of Present Illness:    Sherry Hess is a 84 y.o. female with paroxysmal atrial fibrillation as well as hypertension.  Overall doing well blood pressure appears to be well controlled recently we discontinue Micardis because of kidney dysfunction as advised by nephrology.  She denies have any palpitation her blood pressure appears to be well controlled.  Past Medical History:  Diagnosis Date  . Anxiety   . High cholesterol   . Hypertension   . TIA (transient ischemic attack)     Past Surgical History:  Procedure Laterality Date  . COLONOSCOPY N/A 03/11/2017   Procedure: COLONOSCOPY;  Surgeon: Gatha Mayer, MD;  Location: [facility] ENDOSCOPY;  Service: Endoscopy;  Laterality: N/A;  . ESOPHAGOGASTRODUODENOSCOPY N/A 03/11/2017   Procedure: ESOPHAGOGASTRODUODENOSCOPY (EGD);  Surgeon: Gatha Mayer, MD;  Location: [facility] ENDOSCOPY;  Service: Endoscopy;  Laterality: N/A;  . TONSILLECTOMY    . VESICOVAGINAL FISTULA CLOSURE W/ TAH      Current Medications: Current Meds  Medication Sig  . apixaban (ELIQUIS) 2.5 MG TABS tablet Take 1 tablet (2.5 mg total) by mouth 2 (two) times daily.  Marland Kitchen atorvastatin (LIPITOR) 20 MG tablet Take 20 mg by mouth daily.  . carvedilol (COREG) 12.5 MG tablet Take 12.5 mg by mouth 2 (two) times daily.  . Ferrous Sulfate ([employer] SLOW RELEASE IRON PO) Take 1 tablet daily by mouth.  . Fish Oil-Cholecalciferol (FISH OIL + D3) 1000-1000 MG-UNIT CAPS Take by mouth.  Marland Kitchen FLUoxetine (PROZAC) 10 MG tablet Take 10 mg by mouth daily.  . hydrALAZINE (APRESOLINE) 50 MG tablet Take 1.5 tablets (75 mg total) by mouth 3 (three) times daily.  Marland Kitchen omeprazole (PRILOSEC) 20 MG capsule Take 20 mg by mouth daily.   . sodium bicarbonate 650  MG tablet Take 650 mg 2 (two) times daily by mouth.  . telmisartan (MICARDIS) 80 MG tablet TAKE 1/2 TABLET BY MOUTH ONCE DAILY.     Allergies:   Adhesive [tape]; Clonidine derivatives; Minoxidil; and Norvasc [amlodipine besylate]   Social History   Socioeconomic History  . Marital status: Widowed    Spouse name: Not on file  . Number of children: Not on file  . Years of education: Not on file  . Highest education level: Not on file  Occupational History  . Not on file  Social Needs  . Financial resource strain: Not on file  . Food insecurity:    Worry: Not on file    Inability: Not on file  . Transportation needs:    Medical: Not on file    Non-medical: Not on file  Tobacco Use  . Smoking status: Never Smoker  . Smokeless tobacco: Never Used  Substance and Sexual Activity  . Alcohol use: No  . Drug use: No  . Sexual activity: Not on file  Lifestyle  . Physical activity:    Days per week: Not on file    Minutes per session: Not on file  . Stress: Not on file  Relationships  . Social connections:    Talks on phone: Not on file    Gets together: Not on file    Attends religious service: Not on file    Active member of club or  organization: Not on file    Attends meetings of clubs or organizations: Not on file    Relationship status: Not on file  Other Topics Concern  . Not on file  Social History Narrative  . Not on file     Family History: [name] family history includes Cancer in her brother and father; Stroke in her mother. There is no history of Ataxia, Chorea, Dementia, Mental retardation, Migraines, Multiple sclerosis, Neurofibromatosis, Neuropathy, Parkinsonism, or Seizures. ROS:   Please see the history of present illness.    All 14 point review of systems negative except as described per history of present illness  EKGs/Labs/Other Studies Reviewed:      Recent Labs: 03/08/2017: ALT 12 03/10/2017: TSH 2.596 03/28/2017: Hemoglobin 12.7;  Platelets 276 07/06/2017: BUN 33; Creatinine, Ser 1.47; Potassium 4.3; Sodium 132  Recent Lipid Panel    Component Value Date/Time   CHOL 117 03/09/2017 0226   TRIG 50 03/09/2017 0226   HDL 36 (L) 03/09/2017 0226   CHOLHDL 3.3 03/09/2017 0226   VLDL 10 03/09/2017 0226   LDLCALC 71 03/09/2017 0226    Physical Exam:    VS:  BP (!) 142/68 (BP Location: Right Arm, Patient Position: Sitting, Cuff Size: Normal)   Pulse 61   Ht 5' 3.5" (1.613 m)   Wt 145 lb (65.8 kg)   SpO2 92%   BMI 25.28 kg/m     Wt Readings from Last 3 Encounters:  12/15/17 145 lb (65.8 kg)  08/03/17 146 lb 12.8 oz (66.6 kg)  07/06/17 142 lb (64.4 kg)     GEN:  Well nourished, well developed in no acute distress HEENT: Normal NECK: No JVD; No carotid bruits LYMPHATICS: No lymphadenopathy CARDIAC: RRR, no murmurs, no rubs, no gallops RESPIRATORY:  Clear to auscultation without rales, wheezing or rhonchi  ABDOMEN: Soft, non-tender, non-distended MUSCULOSKELETAL:  No edema; No deformity  SKIN: Warm and dry LOWER EXTREMITIES: no swelling NEUROLOGIC:  Alert and oriented x 3 PSYCHIATRIC:  Normal affect   ASSESSMENT:    1. Essential hypertension   2. Paroxysmal atrial fibrillation (HCC)   3. Chest pain in adult    PLAN:    In order of problems listed above:  1. Essential hypertension blood pressure well controlled continue present management. 2. Paroxysmal atrial fibrillation: Anticoagulated with Eliquis at appropriate dose which will continue. 3. Chest pain denies having any.  Overall she is doing well see her back in 3 months sooner she had a problem   Medication Adjustments/Labs and Tests Ordered: Current medicines are reviewed at length with the patient today.  Concerns regarding medicines are outlined above.  No orders of the defined types were placed in this encounter.  Medication changes: No orders of the defined types were placed in this encounter.   Signed, Park Liter, MD,  [facility] 12/15/2017 10:47 AM    [place]

## 2017-12-15 NOTE — Patient Instructions (Signed)

## 2017-12-27 DIAGNOSIS — Z6826 Body mass index (BMI) 26.0-26.9, adult: Secondary | ICD-10-CM | POA: Diagnosis not present

## 2017-12-27 DIAGNOSIS — N951 Menopausal and female climacteric states: Secondary | ICD-10-CM | POA: Diagnosis not present

## 2017-12-27 DIAGNOSIS — Z0000 Encounter for general adult medical examination without abnormal findings: Secondary | ICD-10-CM | POA: Diagnosis not present

## 2017-12-27 DIAGNOSIS — Z1231 Encounter for screening mammogram for malignant neoplasm of breast: Secondary | ICD-10-CM | POA: Diagnosis not present

## 2017-12-27 DIAGNOSIS — Z23 Encounter for immunization: Secondary | ICD-10-CM | POA: Diagnosis not present

## 2017-12-27 DIAGNOSIS — E663 Overweight: Secondary | ICD-10-CM | POA: Diagnosis not present

## 2018-01-17 DIAGNOSIS — R42 Dizziness and giddiness: Secondary | ICD-10-CM | POA: Diagnosis not present

## 2018-01-17 DIAGNOSIS — E871 Hypo-osmolality and hyponatremia: Secondary | ICD-10-CM | POA: Diagnosis not present

## 2018-01-17 DIAGNOSIS — F5102 Adjustment insomnia: Secondary | ICD-10-CM | POA: Diagnosis not present

## 2018-01-25 DIAGNOSIS — M810 Age-related osteoporosis without current pathological fracture: Secondary | ICD-10-CM | POA: Diagnosis not present

## 2018-01-25 DIAGNOSIS — M8589 Other specified disorders of bone density and structure, multiple sites: Secondary | ICD-10-CM | POA: Diagnosis not present

## 2018-01-25 DIAGNOSIS — Z1231 Encounter for screening mammogram for malignant neoplasm of breast: Secondary | ICD-10-CM | POA: Diagnosis not present

## 2018-01-25 DIAGNOSIS — M8588 Other specified disorders of bone density and structure, other site: Secondary | ICD-10-CM | POA: Diagnosis not present

## 2018-01-25 DIAGNOSIS — N959 Unspecified menopausal and perimenopausal disorder: Secondary | ICD-10-CM | POA: Diagnosis not present

## 2018-02-21 DIAGNOSIS — I480 Paroxysmal atrial fibrillation: Secondary | ICD-10-CM | POA: Diagnosis not present

## 2018-02-21 DIAGNOSIS — R922 Inconclusive mammogram: Secondary | ICD-10-CM | POA: Diagnosis not present

## 2018-02-21 DIAGNOSIS — N184 Chronic kidney disease, stage 4 (severe): Secondary | ICD-10-CM | POA: Diagnosis not present

## 2018-02-21 DIAGNOSIS — R7301 Impaired fasting glucose: Secondary | ICD-10-CM | POA: Diagnosis not present

## 2018-02-21 DIAGNOSIS — I10 Essential (primary) hypertension: Secondary | ICD-10-CM | POA: Diagnosis not present

## 2018-02-21 DIAGNOSIS — E871 Hypo-osmolality and hyponatremia: Secondary | ICD-10-CM | POA: Diagnosis not present

## 2018-02-21 DIAGNOSIS — E782 Mixed hyperlipidemia: Secondary | ICD-10-CM | POA: Diagnosis not present

## 2018-02-21 DIAGNOSIS — I129 Hypertensive chronic kidney disease with stage 1 through stage 4 chronic kidney disease, or unspecified chronic kidney disease: Secondary | ICD-10-CM | POA: Diagnosis not present

## 2018-02-23 DIAGNOSIS — D6489 Other specified anemias: Secondary | ICD-10-CM | POA: Diagnosis not present

## 2018-02-23 DIAGNOSIS — D649 Anemia, unspecified: Secondary | ICD-10-CM | POA: Diagnosis not present

## 2018-02-23 DIAGNOSIS — D509 Iron deficiency anemia, unspecified: Secondary | ICD-10-CM | POA: Diagnosis not present

## 2018-03-02 DIAGNOSIS — D508 Other iron deficiency anemias: Secondary | ICD-10-CM | POA: Diagnosis not present

## 2018-03-02 DIAGNOSIS — I129 Hypertensive chronic kidney disease with stage 1 through stage 4 chronic kidney disease, or unspecified chronic kidney disease: Secondary | ICD-10-CM | POA: Diagnosis not present

## 2018-03-06 DIAGNOSIS — D6489 Other specified anemias: Secondary | ICD-10-CM | POA: Diagnosis not present

## 2018-03-06 DIAGNOSIS — Z1211 Encounter for screening for malignant neoplasm of colon: Secondary | ICD-10-CM | POA: Diagnosis not present

## 2018-03-06 DIAGNOSIS — D631 Anemia in chronic kidney disease: Secondary | ICD-10-CM | POA: Diagnosis not present

## 2018-03-06 DIAGNOSIS — N289 Disorder of kidney and ureter, unspecified: Secondary | ICD-10-CM | POA: Diagnosis not present

## 2018-03-06 DIAGNOSIS — N189 Chronic kidney disease, unspecified: Secondary | ICD-10-CM | POA: Diagnosis not present

## 2018-03-14 DIAGNOSIS — D509 Iron deficiency anemia, unspecified: Secondary | ICD-10-CM | POA: Diagnosis not present

## 2018-03-14 DIAGNOSIS — D631 Anemia in chronic kidney disease: Secondary | ICD-10-CM | POA: Diagnosis not present

## 2018-03-14 DIAGNOSIS — N189 Chronic kidney disease, unspecified: Secondary | ICD-10-CM | POA: Diagnosis not present

## 2018-03-14 DIAGNOSIS — E611 Iron deficiency: Secondary | ICD-10-CM | POA: Diagnosis not present

## 2018-03-16 DIAGNOSIS — R921 Mammographic calcification found on diagnostic imaging of breast: Secondary | ICD-10-CM | POA: Diagnosis not present

## 2018-03-20 DIAGNOSIS — D509 Iron deficiency anemia, unspecified: Secondary | ICD-10-CM | POA: Diagnosis not present

## 2018-04-04 DIAGNOSIS — Z90710 Acquired absence of both cervix and uterus: Secondary | ICD-10-CM | POA: Diagnosis not present

## 2018-04-04 DIAGNOSIS — K449 Diaphragmatic hernia without obstruction or gangrene: Secondary | ICD-10-CM | POA: Diagnosis not present

## 2018-04-04 DIAGNOSIS — D649 Anemia, unspecified: Secondary | ICD-10-CM | POA: Diagnosis not present

## 2018-04-04 DIAGNOSIS — D509 Iron deficiency anemia, unspecified: Secondary | ICD-10-CM | POA: Diagnosis not present

## 2018-04-04 DIAGNOSIS — R195 Other fecal abnormalities: Secondary | ICD-10-CM | POA: Diagnosis not present

## 2018-04-04 DIAGNOSIS — L819 Disorder of pigmentation, unspecified: Secondary | ICD-10-CM | POA: Diagnosis not present

## 2018-04-04 DIAGNOSIS — K2950 Unspecified chronic gastritis without bleeding: Secondary | ICD-10-CM | POA: Diagnosis not present

## 2018-04-04 DIAGNOSIS — Z79899 Other long term (current) drug therapy: Secondary | ICD-10-CM | POA: Diagnosis not present

## 2018-04-04 DIAGNOSIS — I10 Essential (primary) hypertension: Secondary | ICD-10-CM | POA: Diagnosis not present

## 2018-05-02 DIAGNOSIS — D649 Anemia, unspecified: Secondary | ICD-10-CM | POA: Diagnosis not present

## 2018-05-02 DIAGNOSIS — I4891 Unspecified atrial fibrillation: Secondary | ICD-10-CM | POA: Diagnosis not present

## 2018-05-02 DIAGNOSIS — E785 Hyperlipidemia, unspecified: Secondary | ICD-10-CM | POA: Diagnosis not present

## 2018-05-02 DIAGNOSIS — I129 Hypertensive chronic kidney disease with stage 1 through stage 4 chronic kidney disease, or unspecified chronic kidney disease: Secondary | ICD-10-CM | POA: Diagnosis not present

## 2018-05-02 DIAGNOSIS — N183 Chronic kidney disease, stage 3 (moderate): Secondary | ICD-10-CM | POA: Diagnosis not present

## 2018-05-02 DIAGNOSIS — E871 Hypo-osmolality and hyponatremia: Secondary | ICD-10-CM | POA: Diagnosis not present

## 2018-05-22 DIAGNOSIS — E611 Iron deficiency: Secondary | ICD-10-CM | POA: Diagnosis not present

## 2018-05-22 DIAGNOSIS — Z79899 Other long term (current) drug therapy: Secondary | ICD-10-CM | POA: Diagnosis not present

## 2018-05-22 DIAGNOSIS — D509 Iron deficiency anemia, unspecified: Secondary | ICD-10-CM | POA: Diagnosis not present

## 2018-05-22 DIAGNOSIS — N189 Chronic kidney disease, unspecified: Secondary | ICD-10-CM | POA: Diagnosis not present

## 2018-05-22 DIAGNOSIS — D631 Anemia in chronic kidney disease: Secondary | ICD-10-CM | POA: Diagnosis not present

## 2018-05-26 DIAGNOSIS — D631 Anemia in chronic kidney disease: Secondary | ICD-10-CM | POA: Diagnosis not present

## 2018-05-26 DIAGNOSIS — I129 Hypertensive chronic kidney disease with stage 1 through stage 4 chronic kidney disease, or unspecified chronic kidney disease: Secondary | ICD-10-CM | POA: Diagnosis not present

## 2018-05-26 DIAGNOSIS — E782 Mixed hyperlipidemia: Secondary | ICD-10-CM | POA: Diagnosis not present

## 2018-05-26 DIAGNOSIS — N184 Chronic kidney disease, stage 4 (severe): Secondary | ICD-10-CM | POA: Diagnosis not present

## 2018-05-26 DIAGNOSIS — E871 Hypo-osmolality and hyponatremia: Secondary | ICD-10-CM | POA: Diagnosis not present

## 2018-05-26 DIAGNOSIS — I4811 Longstanding persistent atrial fibrillation: Secondary | ICD-10-CM | POA: Diagnosis not present

## 2018-05-26 DIAGNOSIS — R413 Other amnesia: Secondary | ICD-10-CM | POA: Diagnosis not present

## 2018-05-31 DIAGNOSIS — H353112 Nonexudative age-related macular degeneration, right eye, intermediate dry stage: Secondary | ICD-10-CM | POA: Diagnosis not present

## 2018-05-31 DIAGNOSIS — H26493 Other secondary cataract, bilateral: Secondary | ICD-10-CM | POA: Diagnosis not present

## 2018-06-05 DIAGNOSIS — I6782 Cerebral ischemia: Secondary | ICD-10-CM | POA: Diagnosis not present

## 2018-06-05 DIAGNOSIS — R413 Other amnesia: Secondary | ICD-10-CM | POA: Diagnosis not present

## 2018-06-22 ENCOUNTER — Encounter: Payer: Self-pay | Admitting: Cardiology

## 2018-06-22 ENCOUNTER — Ambulatory Visit (INDEPENDENT_AMBULATORY_CARE_PROVIDER_SITE_OTHER): Payer: PPO | Admitting: Cardiology

## 2018-06-22 VITALS — BP 138/70 | HR 60 | Ht 63.5 in | Wt 148.4 lb

## 2018-06-22 DIAGNOSIS — R5383 Other fatigue: Secondary | ICD-10-CM

## 2018-06-22 DIAGNOSIS — I480 Paroxysmal atrial fibrillation: Secondary | ICD-10-CM

## 2018-06-22 DIAGNOSIS — I10 Essential (primary) hypertension: Secondary | ICD-10-CM | POA: Diagnosis not present

## 2018-06-22 DIAGNOSIS — Z8673 Personal history of transient ischemic attack (TIA), and cerebral infarction without residual deficits: Secondary | ICD-10-CM | POA: Diagnosis not present

## 2018-06-22 NOTE — Patient Instructions (Signed)
Medication Instructions:  Your physician recommends that you continue on your current medications as directed. Please refer to the Current Medication list given to you today.  If you need a refill on your cardiac medications before your next appointment, please call your pharmacy.   Lab work: None.  If you have labs (blood work) drawn today and your tests are completely normal, you will receive your results only by: . MyChart Message (if you have MyChart) OR . A paper copy in the mail If you have any lab test that is abnormal or we need to change your treatment, we will call you to review the results.  Testing/Procedures: Your physician has requested that you have an echocardiogram. Echocardiography is a painless test that uses sound waves to create images of your heart. It provides your doctor with information about the size and shape of your heart and how well your heart's chambers and valves are working. This procedure takes approximately one hour. There are no restrictions for this procedure.    Follow-Up: At [facility], you and your health needs are our priority.  As part of our continuing mission to provide you with exceptional heart care, we have created designated Provider Care Teams.  These Care Teams include your primary Cardiologist (physician) and Advanced Practice Providers (APPs -  Physician Assistants and Nurse Practitioners) who all work together to provide you with the care you need, when you need it. You will need a follow up appointment in 6 months.  Please call our office 2 months in advance to schedule this appointment.  You may see No primary care provider on file. or another member of our [facility] Provider Team in [place]: Brian Munley, MD . Rajan Revankar, MD  Any Other Special Instructions Will Be Listed Below (If Applicable).   Echocardiogram An echocardiogram is a procedure that uses painless sound waves (ultrasound) to produce an image of the heart.  Images from an echocardiogram can provide important information about:  Signs of coronary artery disease (CAD).  Aneurysm detection. An aneurysm is a weak or damaged part of an artery wall that bulges out from the normal force of blood pumping through the body.  Heart size and shape. Changes in the size or shape of the heart can be associated with certain conditions, including heart failure, aneurysm, and CAD.  Heart muscle function.  Heart valve function.  Signs of a past heart attack.  Fluid buildup around the heart.  Thickening of the heart muscle.  A tumor or infectious growth around the heart valves. Tell a health care provider about:  Any allergies you have.  All medicines you are taking, including vitamins, herbs, eye drops, creams, and over-the-counter medicines.  Any blood disorders you have.  Any surgeries you have had.  Any medical conditions you have.  Whether you are pregnant or may be pregnant. What are the risks? Generally, this is a safe procedure. However, problems may occur, including:  Allergic reaction to dye (contrast) that may be used during the procedure. What happens before the procedure? No specific preparation is needed. You may eat and drink normally. What happens during the procedure?   An IV tube may be inserted into one of your veins.  You may receive contrast through this tube. A contrast is an injection that improves the quality of the pictures from your heart.  A gel will be applied to your chest.  A wand-like tool (transducer) will be moved over your chest. The gel will help to   transmit the sound waves from the transducer.  The sound waves will harmlessly bounce off of your heart to allow the heart images to be captured in real-time motion. The images will be recorded on a computer. The procedure may vary among health care providers and hospitals. What happens after the procedure?  You may return to your normal, everyday life,  including diet, activities, and medicines, unless your health care provider tells you not to do that. Summary  An echocardiogram is a procedure that uses painless sound waves (ultrasound) to produce an image of the heart.  Images from an echocardiogram can provide important information about the size and shape of your heart, heart muscle function, heart valve function, and fluid buildup around your heart.  You do not need to do anything to prepare before this procedure. You may eat and drink normally.  After the echocardiogram is completed, you may return to your normal, everyday life, unless your health care provider tells you not to do that. This information is not intended to replace advice given to you by your health care provider. Make sure you discuss any questions you have with your health care provider. Document Released: 04/30/2000 Document Revised: 06/05/2016 Document Reviewed: 06/05/2016 Elsevier Interactive Patient Education  2019 Elsevier Inc.    

## 2018-06-22 NOTE — Progress Notes (Signed)
Cardiology Office Note:    Date:  06/22/2018   ID:  Sherry Hess, DOB July 23, 1930, MRN [id number]  PCP:  [name], MD  Cardiologist:  [name], MD    Referring MD: [name], MD   Chief Complaint  Patient presents with  . Follow-up  Doing well complain of having fatigue and tiredness sometimes  History of Present Illness:    Sherry Hess is a 85 y.o. female with atrial fibrillation, anticoagulated with Eliquis which I will continue.  Her heart rate appears to be regular today I will ask her to have EKG today.  Denies having a chest pain tightness squeezing pressure branches but does complain of having some fatigue and tiredness when she does things.  Past Medical History:  Diagnosis Date  . Anxiety   . High cholesterol   . Hypertension   . TIA (transient ischemic attack)     Past Surgical History:  Procedure Laterality Date  . COLONOSCOPY N/A 03/11/2017   Procedure: COLONOSCOPY;  Surgeon: Gatha Mayer, MD;  Location: [facility] ENDOSCOPY;  Service: Endoscopy;  Laterality: N/A;  . ESOPHAGOGASTRODUODENOSCOPY N/A 03/11/2017   Procedure: ESOPHAGOGASTRODUODENOSCOPY (EGD);  Surgeon: Gatha Mayer, MD;  Location[facility] ENDOSCOPY;  Service: Endoscopy;  Laterality: N/A;  . TONSILLECTOMY    . VESICOVAGINAL FISTULA CLOSURE W/ TAH      Current Medications: Current Meds  Medication Sig  . apixaban (ELIQUIS) 2.5 MG TABS tablet Take 1 tablet (2.5 mg total) by mouth 2 (two) times daily.  Marland Kitchen atorvastatin (LIPITOR) 20 MG tablet Take 20 mg by mouth daily.  . carvedilol (COREG) 12.5 MG tablet Take 12.5 mg by mouth 2 (two) times daily.  Marland Kitchen diltiazem (CARDIZEM CD) 180 MG 24 hr capsule Take 1 capsule (180 mg total) by mouth daily.  . Ferrous Sulfate (CVS SLOW RELEASE IRON PO) Take 1 tablet daily by mouth.  . Fish Oil-Cholecalciferol (FISH OIL + D3) 1000-1000 MG-UNIT CAPS Take by mouth.  Marland Kitchen FLUoxetine (PROZAC) 10 MG tablet Take 10 mg by mouth daily.  . hydrALAZINE (APRESOLINE)  50 MG tablet Take 1.5 tablets (75 mg total) by mouth 3 (three) times daily.  . hydrochlorothiazide (MICROZIDE) 12.5 MG capsule Take 1 capsule (12.5 mg total) by mouth daily.  Marland Kitchen omeprazole (PRILOSEC) 20 MG capsule Take 20 mg by mouth daily.   . sodium bicarbonate 650 MG tablet Take 650 mg 2 (two) times daily by mouth.  . telmisartan (MICARDIS) 80 MG tablet TAKE 1/2 TABLET BY MOUTH ONCE DAILY.     Allergies:   Adhesive [tape]; Clonidine derivatives; Minoxidil; and Norvasc [amlodipine besylate]   Social History   Socioeconomic History  . Marital status: Widowed    Spouse name: Not on file  . Number of children: Not on file  . Years of education: Not on file  . Highest education level: Not on file  Occupational History  . Not on file  Social Needs  . Financial resource strain: Not on file  . Food insecurity:    Worry: Not on file    Inability: Not on file  . Transportation needs:    Medical: Not on file    Non-medical: Not on file  Tobacco Use  . Smoking status: Never Smoker  . Smokeless tobacco: Never Used  Substance and Sexual Activity  . Alcohol use: No  . Drug use: No  . Sexual activity: Not on file  Lifestyle  . Physical activity:    Days per week: Not on file  Minutes per session: Not on file  . Stress: Not on file  Relationships  . Social connections:    Talks on phone: Not on file    Gets together: Not on file    Attends religious service: Not on file    Active member of club or organization: Not on file    Attends meetings of clubs or organizations: Not on file    Relationship status: Not on file  Other Topics Concern  . Not on file  Social History Narrative  . Not on file     Family History: [name] family history includes Cancer in her brother and father; Stroke in her mother. There is no history of Ataxia, Chorea, Dementia, Mental retardation, Migraines, Multiple sclerosis, Neurofibromatosis, Neuropathy, Parkinsonism, or Seizures. ROS:   Please  see the history of present illness.    All 14 point review of systems negative except as described per history of present illness  EKGs/Labs/Other Studies Reviewed:      Recent Labs: 07/06/2017: BUN 33; Creatinine, Ser 1.47; Potassium 4.3; Sodium 132  Recent Lipid Panel    Component Value Date/Time   CHOL 117 03/09/2017 0226   TRIG 50 03/09/2017 0226   HDL 36 (L) 03/09/2017 0226   CHOLHDL 3.3 03/09/2017 0226   VLDL 10 03/09/2017 0226   LDLCALC 71 03/09/2017 0226    Physical Exam:    VS:  BP 138/70   Pulse 60   Ht 5' 3.5" (1.613 m)   Wt 148 lb 6.4 oz (67.3 kg)   SpO2 95%   BMI 25.88 kg/m     Wt Readings from Last 3 Encounters:  06/22/18 148 lb 6.4 oz (67.3 kg)  12/15/17 145 lb (65.8 kg)  08/03/17 146 lb 12.8 oz (66.6 kg)     GEN:  Well nourished, well developed in no acute distress HEENT: Normal NECK: No JVD; No carotid bruits LYMPHATICS: No lymphadenopathy CARDIAC: RRR, no murmurs, no rubs, no gallops RESPIRATORY:  Clear to auscultation without rales, wheezing or rhonchi  ABDOMEN: Soft, non-tender, non-distended MUSCULOSKELETAL:  No edema; No deformity  SKIN: Warm and dry LOWER EXTREMITIES: no swelling NEUROLOGIC:  Alert and oriented x 3 PSYCHIATRIC:  Normal affect   ASSESSMENT:    1. Paroxysmal atrial fibrillation (HCC)   2. Essential hypertension   3. Hx of transient ischemic attack (TIA)   4. Fatigue, unspecified type    PLAN:    In order of problems listed above:  1. Paroxysmal atrial fibrillation I will ask her to have EKG today to check the rhythm.  Anticoagulated which I will continue she does not reports to have any palpitations. 2. Essential hypertension blood pressure well controlled we will continue present management. 3. History of TIA doing well from that point review denies having any new episodes. 4. Fatigue.  I will ask her to have echocardiogram.   Medication Adjustments/Labs and Tests Ordered: Current medicines are reviewed at length  with the patient today.  Concerns regarding medicines are outlined above.  No orders of the defined types were placed in this encounter.  Medication changes: No orders of the defined types were placed in this encounter.   Signed, Park Liter, MD, [facility] 06/22/2018 10:37 AM    [place]

## 2018-06-22 NOTE — Addendum Note (Signed)
Addended by: Ashok Norris on: 06/22/2018 10:40 AM   Modules accepted: Orders

## 2018-07-12 DIAGNOSIS — I129 Hypertensive chronic kidney disease with stage 1 through stage 4 chronic kidney disease, or unspecified chronic kidney disease: Secondary | ICD-10-CM | POA: Diagnosis not present

## 2018-07-12 DIAGNOSIS — I160 Hypertensive urgency: Secondary | ICD-10-CM | POA: Diagnosis not present

## 2018-07-12 DIAGNOSIS — I4811 Longstanding persistent atrial fibrillation: Secondary | ICD-10-CM | POA: Diagnosis not present

## 2018-07-14 DIAGNOSIS — I160 Hypertensive urgency: Secondary | ICD-10-CM | POA: Diagnosis not present

## 2018-07-14 DIAGNOSIS — I129 Hypertensive chronic kidney disease with stage 1 through stage 4 chronic kidney disease, or unspecified chronic kidney disease: Secondary | ICD-10-CM | POA: Diagnosis not present

## 2018-07-14 DIAGNOSIS — I4811 Longstanding persistent atrial fibrillation: Secondary | ICD-10-CM | POA: Diagnosis not present

## 2018-07-26 ENCOUNTER — Ambulatory Visit (INDEPENDENT_AMBULATORY_CARE_PROVIDER_SITE_OTHER): Payer: PPO

## 2018-07-26 ENCOUNTER — Other Ambulatory Visit: Payer: Self-pay

## 2018-07-26 DIAGNOSIS — I10 Essential (primary) hypertension: Secondary | ICD-10-CM | POA: Diagnosis not present

## 2018-07-26 NOTE — Progress Notes (Signed)
Complete echocardiogram has been performed.  Jimmy Jakiah Goree, RDCS, RVT 

## 2018-07-28 DIAGNOSIS — I129 Hypertensive chronic kidney disease with stage 1 through stage 4 chronic kidney disease, or unspecified chronic kidney disease: Secondary | ICD-10-CM | POA: Diagnosis not present

## 2018-08-09 ENCOUNTER — Other Ambulatory Visit: Payer: Self-pay | Admitting: Cardiology

## 2018-08-15 DIAGNOSIS — I129 Hypertensive chronic kidney disease with stage 1 through stage 4 chronic kidney disease, or unspecified chronic kidney disease: Secondary | ICD-10-CM | POA: Diagnosis not present

## 2018-08-15 DIAGNOSIS — N183 Chronic kidney disease, stage 3 (moderate): Secondary | ICD-10-CM | POA: Diagnosis not present

## 2018-08-15 DIAGNOSIS — I4891 Unspecified atrial fibrillation: Secondary | ICD-10-CM | POA: Diagnosis not present

## 2018-08-15 DIAGNOSIS — E871 Hypo-osmolality and hyponatremia: Secondary | ICD-10-CM | POA: Diagnosis not present

## 2018-08-28 DIAGNOSIS — D508 Other iron deficiency anemias: Secondary | ICD-10-CM | POA: Diagnosis not present

## 2018-08-28 DIAGNOSIS — R5383 Other fatigue: Secondary | ICD-10-CM | POA: Diagnosis not present

## 2018-08-29 DIAGNOSIS — L570 Actinic keratosis: Secondary | ICD-10-CM | POA: Diagnosis not present

## 2018-08-29 DIAGNOSIS — R972 Elevated prostate specific antigen [PSA]: Secondary | ICD-10-CM | POA: Diagnosis not present

## 2018-08-29 DIAGNOSIS — L853 Xerosis cutis: Secondary | ICD-10-CM | POA: Diagnosis not present

## 2018-08-29 DIAGNOSIS — L821 Other seborrheic keratosis: Secondary | ICD-10-CM | POA: Diagnosis not present

## 2018-08-29 DIAGNOSIS — R0902 Hypoxemia: Secondary | ICD-10-CM | POA: Diagnosis not present

## 2018-08-29 DIAGNOSIS — D508 Other iron deficiency anemias: Secondary | ICD-10-CM | POA: Diagnosis not present

## 2018-08-29 DIAGNOSIS — Z85828 Personal history of other malignant neoplasm of skin: Secondary | ICD-10-CM | POA: Diagnosis not present

## 2018-08-29 DIAGNOSIS — Z08 Encounter for follow-up examination after completed treatment for malignant neoplasm: Secondary | ICD-10-CM | POA: Diagnosis not present

## 2018-08-29 DIAGNOSIS — L578 Other skin changes due to chronic exposure to nonionizing radiation: Secondary | ICD-10-CM | POA: Diagnosis not present

## 2018-08-29 DIAGNOSIS — R531 Weakness: Secondary | ICD-10-CM | POA: Diagnosis not present

## 2018-08-30 DIAGNOSIS — E782 Mixed hyperlipidemia: Secondary | ICD-10-CM | POA: Diagnosis not present

## 2018-08-30 DIAGNOSIS — I129 Hypertensive chronic kidney disease with stage 1 through stage 4 chronic kidney disease, or unspecified chronic kidney disease: Secondary | ICD-10-CM | POA: Diagnosis not present

## 2018-08-30 DIAGNOSIS — R918 Other nonspecific abnormal finding of lung field: Secondary | ICD-10-CM | POA: Diagnosis not present

## 2018-08-30 DIAGNOSIS — Z9911 Dependence on respirator [ventilator] status: Secondary | ICD-10-CM | POA: Diagnosis not present

## 2018-08-30 DIAGNOSIS — N184 Chronic kidney disease, stage 4 (severe): Secondary | ICD-10-CM | POA: Diagnosis not present

## 2018-08-30 DIAGNOSIS — Z4682 Encounter for fitting and adjustment of non-vascular catheter: Secondary | ICD-10-CM | POA: Diagnosis not present

## 2018-08-30 DIAGNOSIS — I4811 Longstanding persistent atrial fibrillation: Secondary | ICD-10-CM | POA: Diagnosis not present

## 2018-08-30 DIAGNOSIS — D631 Anemia in chronic kidney disease: Secondary | ICD-10-CM | POA: Diagnosis not present

## 2018-08-30 DIAGNOSIS — D508 Other iron deficiency anemias: Secondary | ICD-10-CM | POA: Diagnosis not present

## 2018-09-01 DIAGNOSIS — N189 Chronic kidney disease, unspecified: Secondary | ICD-10-CM | POA: Diagnosis not present

## 2018-09-01 DIAGNOSIS — D631 Anemia in chronic kidney disease: Secondary | ICD-10-CM | POA: Diagnosis not present

## 2018-09-01 DIAGNOSIS — D509 Iron deficiency anemia, unspecified: Secondary | ICD-10-CM | POA: Diagnosis not present

## 2018-09-01 DIAGNOSIS — E611 Iron deficiency: Secondary | ICD-10-CM | POA: Diagnosis not present

## 2018-10-28 IMAGING — MR MR HEAD W/O CM
9 of 11 series · 30 of 48 positions shown · non-contrast
Comparison: None.

CLINICAL DATA: Stroke, follow-up. Sudden onset of a aphasia and
right facial droop. Improvement after tPA administration.

EXAM:
MRI HEAD WITHOUT CONTRAST
MRA HEAD WITHOUT CONTRAST
TECHNIQUE: Multiplanar, multiecho pulse sequences of the brain and surrounding
structures were obtained without intravenous contrast. Angiographic
images of the head were obtained using MRA technique without
contrast.

[Series 3: DWI · axial · 3.0mm · 0.94mm/px · z∈[-36,+109]mm · 7 of 100 slices shown (1 of 2)]
[im 1/100]
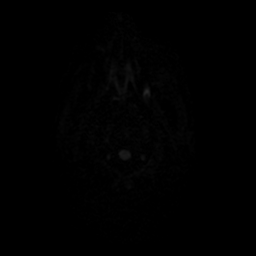
[im 17/100]
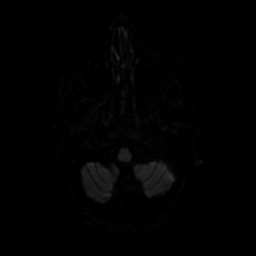
[im 34/100]
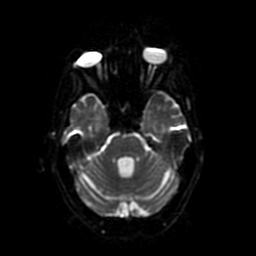
[im 50/100]
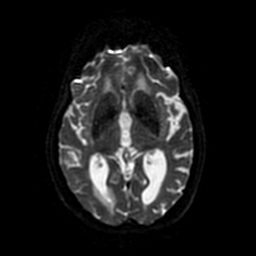
[im 67/100]
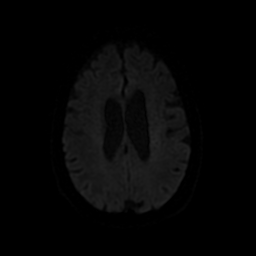
[im 83/100]
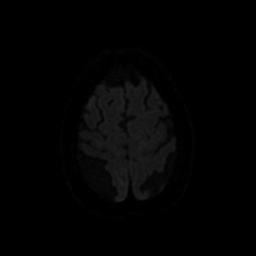
[im 100/100]
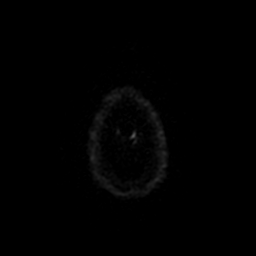

[Series 4: ax (id) 2 · axial · 1.0mm · 0.43mm/px · z∈[-39,+13]mm · 5 of 182 slices shown]
[im 1/182]
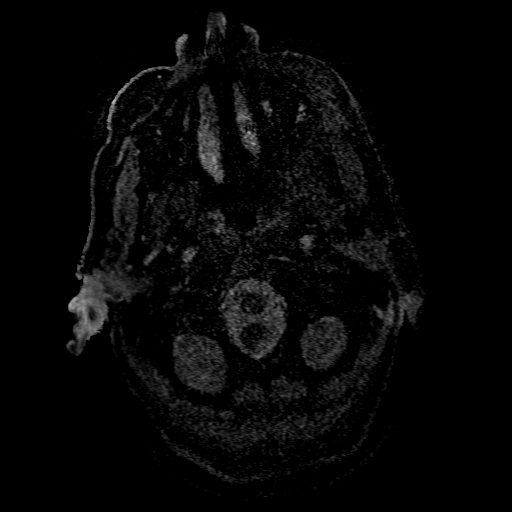
[im 31/182]
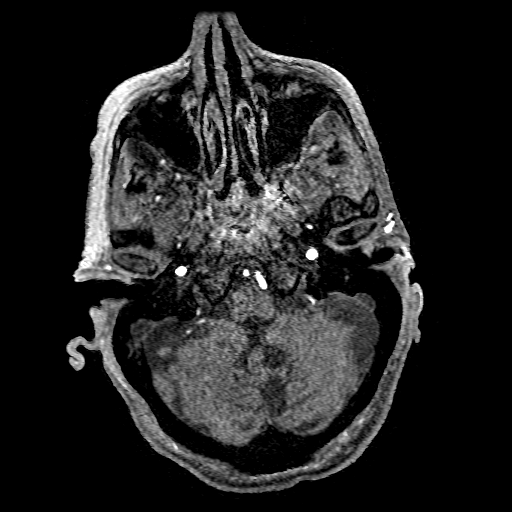
[im 61/182]
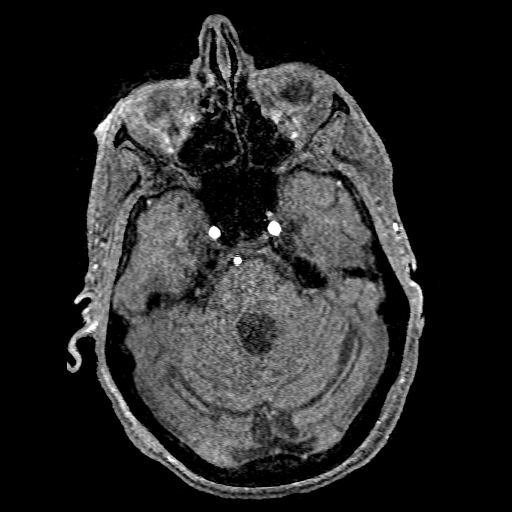
[im 76/182]
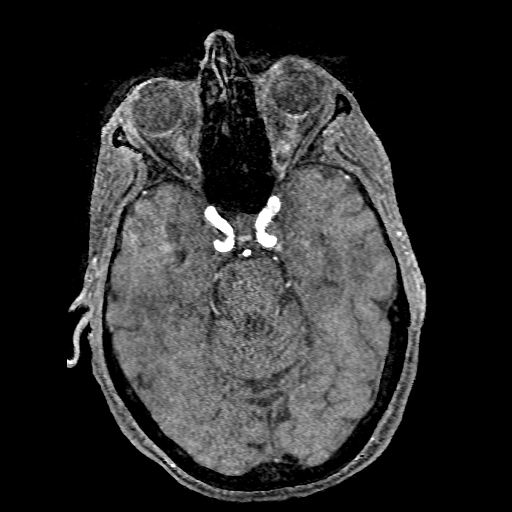
[im 106/182]
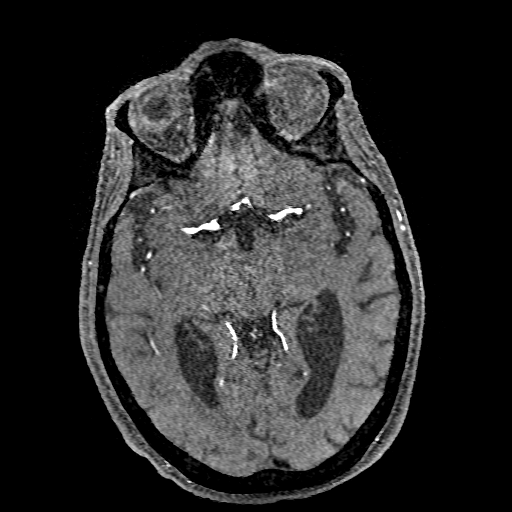

[Series 5: DWI · coronal · 4.0mm · 0.94mm/px · 5 of 72 slices shown (2 of 2)]
[im 1/72]
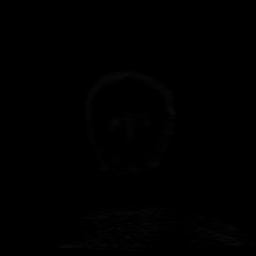
[im 18/72]
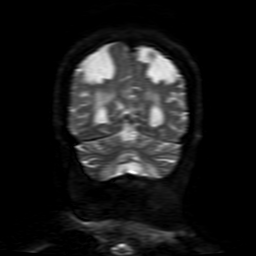
[im 36/72]
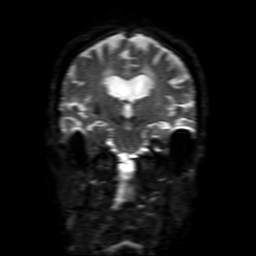
[im 54/72]
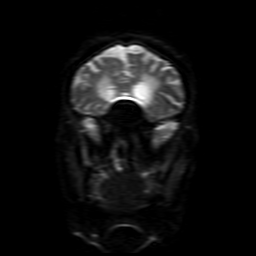
[im 72/72]
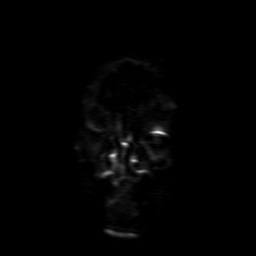

[Series 6: FLAIR · sagittal · 5.0mm · 0.47mm/px · 2 of 23 slices shown (1 of 2)]
[im 1/23]
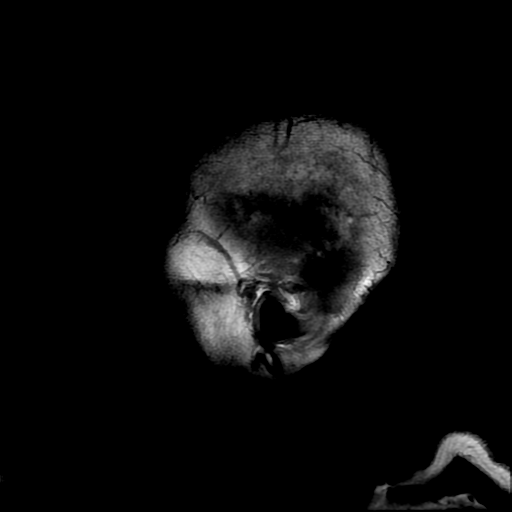
[im 23/23]
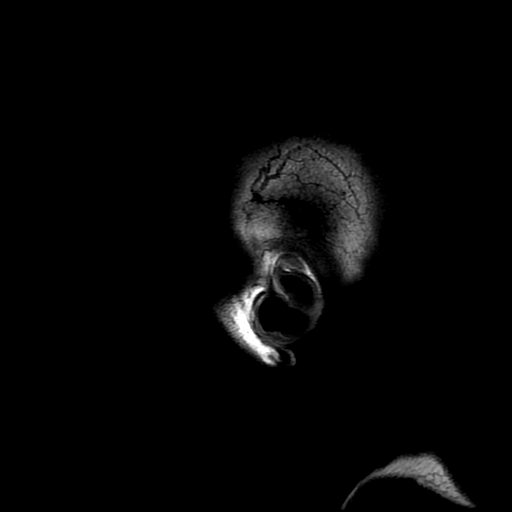

[Series 8: T2 · axial · 5.0mm · 0.43mm/px · z∈[-27,+120]mm · 2 of 26 slices shown (1 of 2)]
[im 1/26]
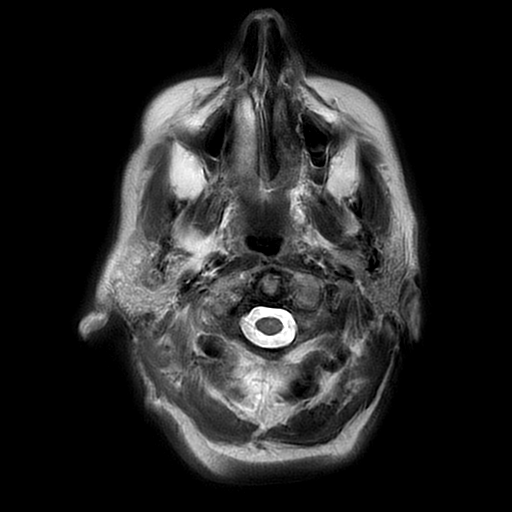
[im 26/26]
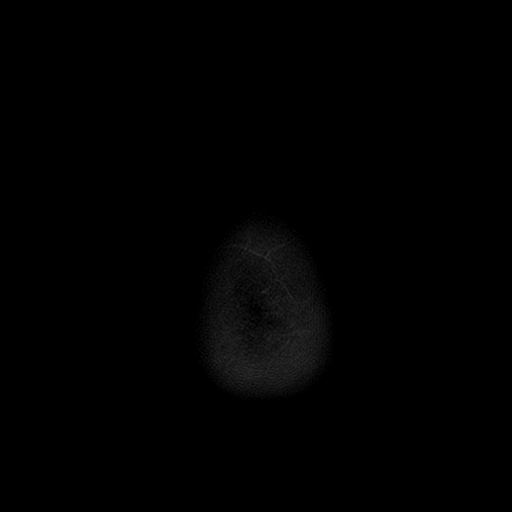

[Series 9: FLAIR · axial · 3.0mm · 0.41mm/px · z∈[-30,+116]mm · 2 of 26 slices shown (2 of 2)]
[im 1/26]
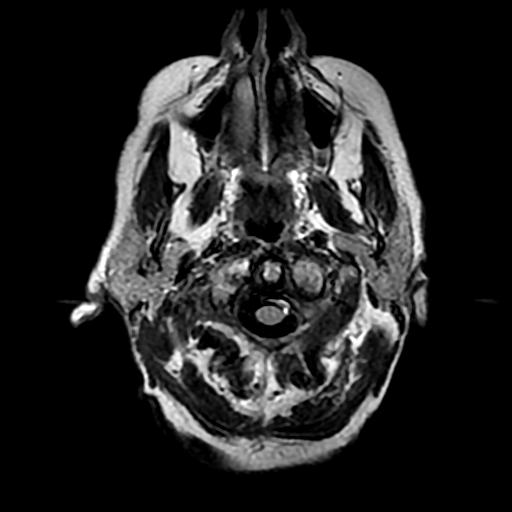
[im 26/26]
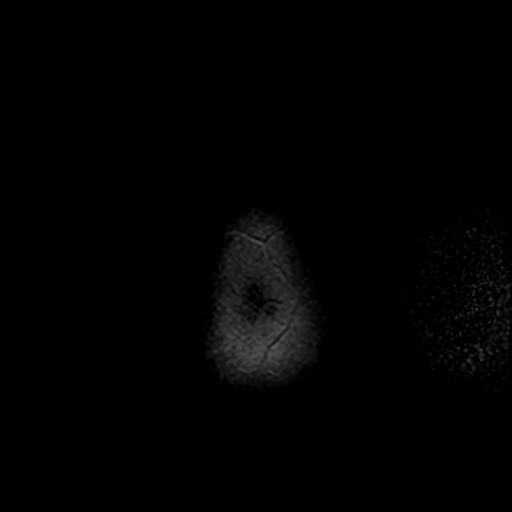

[Series 12: T2 · coronal · 5.0mm · 0.39mm/px · 2 of 30 slices shown (2 of 2)]
[im 1/30]
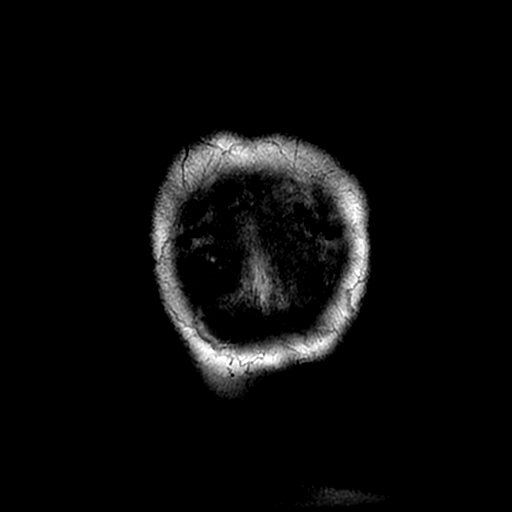
[im 30/30]
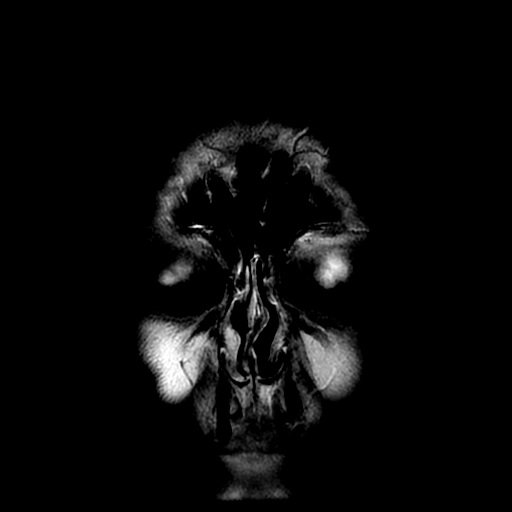

[Series 350: ADC · axial · 3.0mm · 0.94mm/px · z∈[-36,+109]mm · 3 of 50 slices shown (1 of 2)]
[im 1/50]
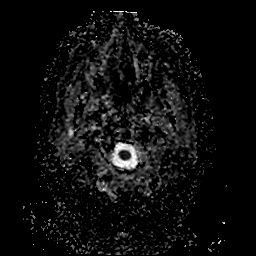
[im 25/50]
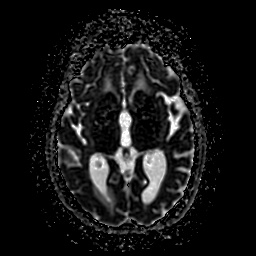
[im 50/50]
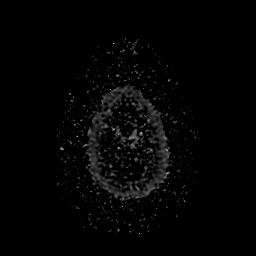

[Series 550: ADC · coronal · 4.0mm · 0.94mm/px · 2 of 36 slices shown (2 of 2)]
[im 1/36]
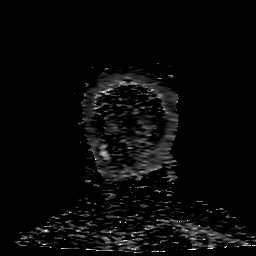
[im 36/36]
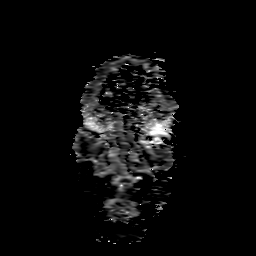

[30 of 48 positions shown; findings below may reference images not displayed]

FINDINGS: MRI HEAD FINDINGS

Brain: The diffusion-weighted images demonstrate no acute or
subacute infarction. No acute hemorrhage or mass lesion is present.
Ventricles are proportionate to the degree of atrophy.
Periventricular white matter disease is moderately advanced for age.
Dilated perivascular spaces are present in the basal ganglia. The
brainstem and cerebellum are normal.

Vascular: Flow is present in the major intracranial arteries.

Skull and upper cervical spine: The skullbase is normal. The
craniocervical junction is normal. Midline sagittal structures are
unremarkable.

Sinuses/Orbits: Mild mucosal thickening is present in the anterior
ethmoid air cells bilaterally. The paranasal sinuses and mastoid air
cells are otherwise clear. Bilateral lens replacements are noted.
The globes and orbits are otherwise within normal limits.

MRA HEAD FINDINGS

The internal carotid artery is are within normal limits the high
cervical segments through the ICA termini bilaterally. There is mild
narrowing at the origin and midportion of the left A1 segment. An
azygos A2 segment is noted. MCA bifurcations are intact bilaterally.
There is mild small vessel attenuation within the MCA branches
bilaterally without a significant proximal stenosis or occlusion.

The left vertebral artery is the dominant vessel. The left PICA
origin is visualized and normal. The right PICA scratched at the
right AICA is normal. There is mild irregularity of the basilar
artery. The right posterior cerebral artery originates from basilar
tip with contribution from the right posterior communicating artery.
The left posterior cerebral artery is of fetal type. There is some
attenuation of distal PCA branch vessels without a significant
proximal stenosis or occlusion.
IMPRESSION: 1. No acute or subacute infarct.
2. Atrophy and white matter changes are moderately advanced for age.
This likely reflects the sequela of chronic microvascular ischemia.
3. MRA circle Willis demonstrates mild diffuse small vessel disease
without significant proximal stenosis, aneurysm, or branch vessel
occlusion within the circle of Willis.

## 2018-11-01 DIAGNOSIS — D509 Iron deficiency anemia, unspecified: Secondary | ICD-10-CM | POA: Diagnosis not present

## 2018-12-08 DIAGNOSIS — B338 Other specified viral diseases: Secondary | ICD-10-CM | POA: Diagnosis not present

## 2018-12-08 DIAGNOSIS — R110 Nausea: Secondary | ICD-10-CM | POA: Diagnosis not present

## 2018-12-08 DIAGNOSIS — R5383 Other fatigue: Secondary | ICD-10-CM | POA: Diagnosis not present

## 2018-12-12 DIAGNOSIS — N3000 Acute cystitis without hematuria: Secondary | ICD-10-CM | POA: Diagnosis not present

## 2018-12-12 DIAGNOSIS — R5383 Other fatigue: Secondary | ICD-10-CM | POA: Diagnosis not present

## 2018-12-12 DIAGNOSIS — I129 Hypertensive chronic kidney disease with stage 1 through stage 4 chronic kidney disease, or unspecified chronic kidney disease: Secondary | ICD-10-CM | POA: Diagnosis not present

## 2018-12-12 DIAGNOSIS — D508 Other iron deficiency anemias: Secondary | ICD-10-CM | POA: Diagnosis not present

## 2018-12-14 DIAGNOSIS — I129 Hypertensive chronic kidney disease with stage 1 through stage 4 chronic kidney disease, or unspecified chronic kidney disease: Secondary | ICD-10-CM | POA: Diagnosis not present

## 2018-12-14 DIAGNOSIS — E871 Hypo-osmolality and hyponatremia: Secondary | ICD-10-CM | POA: Diagnosis not present

## 2018-12-14 DIAGNOSIS — N183 Chronic kidney disease, stage 3 (moderate): Secondary | ICD-10-CM | POA: Diagnosis not present

## 2018-12-14 DIAGNOSIS — N179 Acute kidney failure, unspecified: Secondary | ICD-10-CM | POA: Diagnosis not present

## 2018-12-26 DIAGNOSIS — R5383 Other fatigue: Secondary | ICD-10-CM | POA: Diagnosis not present

## 2019-01-01 DIAGNOSIS — D631 Anemia in chronic kidney disease: Secondary | ICD-10-CM | POA: Diagnosis not present

## 2019-01-01 DIAGNOSIS — N189 Chronic kidney disease, unspecified: Secondary | ICD-10-CM | POA: Diagnosis not present

## 2019-01-01 DIAGNOSIS — D509 Iron deficiency anemia, unspecified: Secondary | ICD-10-CM | POA: Diagnosis not present

## 2019-01-05 DIAGNOSIS — D649 Anemia, unspecified: Secondary | ICD-10-CM | POA: Diagnosis not present

## 2019-01-05 DIAGNOSIS — N179 Acute kidney failure, unspecified: Secondary | ICD-10-CM | POA: Diagnosis not present

## 2019-01-05 DIAGNOSIS — I129 Hypertensive chronic kidney disease with stage 1 through stage 4 chronic kidney disease, or unspecified chronic kidney disease: Secondary | ICD-10-CM | POA: Diagnosis not present

## 2019-01-05 DIAGNOSIS — E039 Hypothyroidism, unspecified: Secondary | ICD-10-CM | POA: Diagnosis not present

## 2019-01-05 DIAGNOSIS — E871 Hypo-osmolality and hyponatremia: Secondary | ICD-10-CM | POA: Diagnosis not present

## 2019-01-05 DIAGNOSIS — R7309 Other abnormal glucose: Secondary | ICD-10-CM | POA: Diagnosis not present

## 2019-01-05 DIAGNOSIS — N183 Chronic kidney disease, stage 3 (moderate): Secondary | ICD-10-CM | POA: Diagnosis not present

## 2019-01-11 DIAGNOSIS — Z0000 Encounter for general adult medical examination without abnormal findings: Secondary | ICD-10-CM | POA: Diagnosis not present

## 2019-02-07 ENCOUNTER — Other Ambulatory Visit: Payer: Self-pay | Admitting: Cardiology

## 2019-02-08 DIAGNOSIS — I4891 Unspecified atrial fibrillation: Secondary | ICD-10-CM | POA: Diagnosis not present

## 2019-02-08 DIAGNOSIS — I129 Hypertensive chronic kidney disease with stage 1 through stage 4 chronic kidney disease, or unspecified chronic kidney disease: Secondary | ICD-10-CM | POA: Diagnosis not present

## 2019-02-08 DIAGNOSIS — N183 Chronic kidney disease, stage 3 (moderate): Secondary | ICD-10-CM | POA: Diagnosis not present

## 2019-02-08 DIAGNOSIS — N179 Acute kidney failure, unspecified: Secondary | ICD-10-CM | POA: Diagnosis not present

## 2019-02-08 DIAGNOSIS — E871 Hypo-osmolality and hyponatremia: Secondary | ICD-10-CM | POA: Diagnosis not present

## 2019-02-26 DIAGNOSIS — I129 Hypertensive chronic kidney disease with stage 1 through stage 4 chronic kidney disease, or unspecified chronic kidney disease: Secondary | ICD-10-CM | POA: Diagnosis not present

## 2019-02-26 DIAGNOSIS — D508 Other iron deficiency anemias: Secondary | ICD-10-CM | POA: Diagnosis not present

## 2019-02-26 DIAGNOSIS — Z23 Encounter for immunization: Secondary | ICD-10-CM | POA: Diagnosis not present

## 2019-02-26 DIAGNOSIS — E038 Other specified hypothyroidism: Secondary | ICD-10-CM | POA: Diagnosis not present

## 2019-02-26 DIAGNOSIS — E782 Mixed hyperlipidemia: Secondary | ICD-10-CM | POA: Diagnosis not present

## 2019-02-26 DIAGNOSIS — N184 Chronic kidney disease, stage 4 (severe): Secondary | ICD-10-CM | POA: Diagnosis not present

## 2019-02-26 DIAGNOSIS — I4811 Longstanding persistent atrial fibrillation: Secondary | ICD-10-CM | POA: Diagnosis not present

## 2019-02-26 DIAGNOSIS — D631 Anemia in chronic kidney disease: Secondary | ICD-10-CM | POA: Diagnosis not present

## 2019-05-03 ENCOUNTER — Ambulatory Visit: Payer: PPO | Admitting: Cardiology

## 2019-05-03 DIAGNOSIS — D509 Iron deficiency anemia, unspecified: Secondary | ICD-10-CM | POA: Diagnosis not present

## 2019-05-15 DIAGNOSIS — I129 Hypertensive chronic kidney disease with stage 1 through stage 4 chronic kidney disease, or unspecified chronic kidney disease: Secondary | ICD-10-CM | POA: Diagnosis not present

## 2019-05-15 DIAGNOSIS — N1831 Chronic kidney disease, stage 3a: Secondary | ICD-10-CM | POA: Diagnosis not present

## 2019-05-30 DIAGNOSIS — N1831 Chronic kidney disease, stage 3a: Secondary | ICD-10-CM | POA: Diagnosis not present

## 2019-06-01 DIAGNOSIS — N1831 Chronic kidney disease, stage 3a: Secondary | ICD-10-CM | POA: Diagnosis not present

## 2019-06-01 DIAGNOSIS — I129 Hypertensive chronic kidney disease with stage 1 through stage 4 chronic kidney disease, or unspecified chronic kidney disease: Secondary | ICD-10-CM | POA: Diagnosis not present

## 2019-06-01 DIAGNOSIS — I4891 Unspecified atrial fibrillation: Secondary | ICD-10-CM | POA: Diagnosis not present

## 2019-06-01 DIAGNOSIS — E871 Hypo-osmolality and hyponatremia: Secondary | ICD-10-CM | POA: Diagnosis not present

## 2019-06-05 ENCOUNTER — Other Ambulatory Visit: Payer: Self-pay | Admitting: *Deleted

## 2019-06-06 ENCOUNTER — Encounter: Payer: Self-pay | Admitting: Cardiology

## 2019-06-06 ENCOUNTER — Other Ambulatory Visit: Payer: Self-pay

## 2019-06-06 ENCOUNTER — Ambulatory Visit (INDEPENDENT_AMBULATORY_CARE_PROVIDER_SITE_OTHER): Payer: PPO | Admitting: Cardiology

## 2019-06-06 VITALS — BP 142/72 | HR 60 | Wt 145.0 lb

## 2019-06-06 DIAGNOSIS — I10 Essential (primary) hypertension: Secondary | ICD-10-CM | POA: Diagnosis not present

## 2019-06-06 DIAGNOSIS — Z8673 Personal history of transient ischemic attack (TIA), and cerebral infarction without residual deficits: Secondary | ICD-10-CM | POA: Diagnosis not present

## 2019-06-06 DIAGNOSIS — I480 Paroxysmal atrial fibrillation: Secondary | ICD-10-CM

## 2019-06-06 DIAGNOSIS — R079 Chest pain, unspecified: Secondary | ICD-10-CM

## 2019-06-06 NOTE — Progress Notes (Signed)
Cardiology Office Note:    Date:  06/06/2019   ID:  [name], DOB 24-May-1930, MRN [id number]  PCP:  [name], MD  Cardiologist:  [name], MD    Referring MD: [name], MD   No chief complaint on file.   History of Present Illness:    Sherry Hess is a 86 y.o. female with past medical history significant paroxysmal atrial fibrillation, history of TIA, atypical chest pain, essential hypertension, dyslipidemia.  She comes today to my office for follow-up like always with her son.  [name] doing very well denies have any chest pain tightness squeezing pressure burning chest no palpitations no dizziness no swelling of lower extremities.  In the matter-of-fact the plan to go to do today and walk.  Just week ago she received first dose of COVID-19 vaccine.  Past Medical History:  Diagnosis Date  . Anxiety   . Benign neoplasm of rectum   . Benign neoplasm of sigmoid colon   . Chest pain in adult 12/27/2015  . CVA (cerebral vascular accident) ([place]) 03/08/2017  . Dyspepsia 03/09/2017  . Essential hypertension 11/20/2014  . Fatigue 12/10/2016  . High cholesterol   . HLD (hyperlipidemia) 03/09/2017  . Hx of transient ischemic attack (TIA) 10/18/2016  . Hypertension   . Normocytic anemia   . Occult blood positive stool 03/09/2017  . Paroxysmal atrial fibrillation ([place]) 02/23/2017  . Syncope 12/10/2016  . TIA (transient ischemic attack)     Past Surgical History:  Procedure Laterality Date  . COLONOSCOPY N/A 03/11/2017   Procedure: COLONOSCOPY;  Surgeon: Gatha Mayer, MD;  Location: [facility] ENDOSCOPY;  Service: Endoscopy;  Laterality: N/A;  . ESOPHAGOGASTRODUODENOSCOPY N/A 03/11/2017   Procedure: ESOPHAGOGASTRODUODENOSCOPY (EGD);  Surgeon: Gatha Mayer, MD;  Location: [facility] ENDOSCOPY;  Service: Endoscopy;  Laterality: N/A;  . TONSILLECTOMY    . VESICOVAGINAL FISTULA CLOSURE W/ TAH      Current Medications: Current Meds  Medication Sig  . apixaban (ELIQUIS)  2.5 MG TABS tablet Take 1 tablet (2.5 mg total) by mouth 2 (two) times daily.  Marland Kitchen atorvastatin (LIPITOR) 20 MG tablet Take 20 mg by mouth daily.  . Calcium Citrate (CAL-CITRATE PO) Take by mouth daily.  . carvedilol (COREG) 12.5 MG tablet Take 12.5 mg by mouth 2 (two) times daily.  Marland Kitchen diltiazem (CARDIZEM CD) 180 MG 24 hr capsule Take 1 capsule (180 mg total) by mouth daily. Patient is due for follow up appointment.  Please call to schedule for future refills (Patient taking differently: Take 180 mg by mouth daily. )  . Ferrous Sulfate ([employer] SLOW RELEASE IRON PO) Take 1 tablet daily by mouth.  . Fish Oil-Cholecalciferol (FISH OIL + D3) 1000-1000 MG-UNIT CAPS Take by mouth.  Marland Kitchen FLUoxetine (PROZAC) 10 MG tablet Take 10 mg by mouth daily.  . hydrALAZINE (APRESOLINE) 100 MG tablet Take 100 mg by mouth 3 (three) times daily.  . hydrALAZINE (APRESOLINE) 50 MG tablet Take 50 mg by mouth 2 (two) times daily.  Marland Kitchen levothyroxine (SYNTHROID) 25 MCG tablet Take 25 mcg by mouth daily.   . memantine (NAMENDA) 10 MG tablet Take 1 tablet by mouth 2 (two) times daily.  . Multiple Vitamin (MULTIVITAMIN) tablet Take 1 tablet by mouth daily.  Marland Kitchen omeprazole (PRILOSEC) 20 MG capsule Take 20 mg by mouth daily.      Allergies:   Adhesive [tape], Clonidine derivatives, Minoxidil, and Norvasc [amlodipine besylate]   Social History   Socioeconomic History  . Marital status: Widowed  Spouse name: Not on file  . Number of children: Not on file  . Years of education: Not on file  . Highest education level: Not on file  Occupational History  . Not on file  Tobacco Use  . Smoking status: Never Smoker  . Smokeless tobacco: Never Used  Substance and Sexual Activity  . Alcohol use: No  . Drug use: No  . Sexual activity: Not on file  Other Topics Concern  . Not on file  Social History Narrative  . Not on file   Social Determinants of Health   Financial Resource Strain:   . Difficulty of Paying Living Expenses: Not  on file  Food Insecurity:   . Worried About Charity fundraiser in the Last Year: Not on file  . Ran Out of Food in the Last Year: Not on file  Transportation Needs:   . Lack of Transportation (Medical): Not on file  . Lack of Transportation (Non-Medical): Not on file  Physical Activity:   . Days of Exercise per Week: Not on file  . Minutes of Exercise per Session: Not on file  Stress:   . Feeling of Stress : Not on file  Social Connections:   . Frequency of Communication with Friends and Family: Not on file  . Frequency of Social Gatherings with Friends and Family: Not on file  . Attends Religious Services: Not on file  . Active Member of Clubs or Organizations: Not on file  . Attends Archivist Meetings: Not on file  . Marital Status: Not on file     Family History: [name] family history includes Cancer in her brother and father; Stroke in her mother. There is no history of Ataxia, Chorea, Dementia, Mental retardation, Migraines, Multiple sclerosis, Neurofibromatosis, Neuropathy, Parkinsonism, or Seizures. ROS:   Please see the history of present illness.    All 14 point review of systems negative except as described per history of present illness  EKGs/Labs/Other Studies Reviewed:      Recent Labs: No results found for requested labs within last 8760 hours.  Recent Lipid Panel    Component Value Date/Time   CHOL 117 03/09/2017 0226   TRIG 50 03/09/2017 0226   HDL 36 (L) 03/09/2017 0226   CHOLHDL 3.3 03/09/2017 0226   VLDL 10 03/09/2017 0226   LDLCALC 71 03/09/2017 0226    Physical Exam:    VS:  BP (!) 142/72 (BP Location: Left Arm, Patient Position: Sitting, Cuff Size: Normal)   Pulse 60   Wt 145 lb (65.8 kg)   SpO2 94%   BMI 25.28 kg/m     Wt Readings from Last 3 Encounters:  06/06/19 145 lb (65.8 kg)  06/22/18 148 lb 6.4 oz (67.3 kg)  12/15/17 145 lb (65.8 kg)     GEN:  Well nourished, well developed in no acute distress HEENT: Normal  NECK: No JVD; No carotid bruits LYMPHATICS: No lymphadenopathy CARDIAC: RRR, no murmurs, no rubs, no gallops RESPIRATORY:  Clear to auscultation without rales, wheezing or rhonchi  ABDOMEN: Soft, non-tender, non-distended MUSCULOSKELETAL:  No edema; No deformity  SKIN: Warm and dry LOWER EXTREMITIES: no swelling NEUROLOGIC:  Alert and oriented x 3 PSYCHIATRIC:  Normal affect   ASSESSMENT:    1. Paroxysmal atrial fibrillation (HCC)   2. Essential hypertension   3. Hx of transient ischemic attack (TIA)   4. Chest pain in adult    PLAN:    In order of problems listed above:  1.  Paroxysmal atrial fibrillation EKG today showed normal sinus rhythm normal PR interval, left ventricle hypertrophy, nonspecific ST segment changes.  Her chads 2 vascular equals 6 continue anticoagulation with Eliquis. 2. Essential hypertension blood pressure well controlled continue present management. 3. History of TIA, noted, no new events, anticoagulated which I will continue. 4. Chest pain denies having any   Medication Adjustments/Labs and Tests Ordered: Current medicines are reviewed at length with the patient today.  Concerns regarding medicines are outlined above.  No orders of the defined types were placed in this encounter.  Medication changes: No orders of the defined types were placed in this encounter.   Signed, Park Liter, MD, [facility] 06/06/2019 9:14 AM    [place]

## 2019-06-06 NOTE — Patient Instructions (Signed)

## 2019-06-26 ENCOUNTER — Other Ambulatory Visit: Payer: Self-pay | Admitting: Family Medicine

## 2019-06-29 ENCOUNTER — Other Ambulatory Visit: Payer: PPO

## 2019-07-02 ENCOUNTER — Ambulatory Visit (INDEPENDENT_AMBULATORY_CARE_PROVIDER_SITE_OTHER): Payer: PPO | Admitting: Family Medicine

## 2019-07-02 ENCOUNTER — Other Ambulatory Visit: Payer: Self-pay

## 2019-07-02 ENCOUNTER — Encounter: Payer: Self-pay | Admitting: Family Medicine

## 2019-07-02 ENCOUNTER — Other Ambulatory Visit: Payer: Self-pay | Admitting: Family Medicine

## 2019-07-02 VITALS — BP 144/70 | HR 68 | Temp 97.6°F | Resp 16 | Ht 62.0 in | Wt 144.4 lb

## 2019-07-02 DIAGNOSIS — I129 Hypertensive chronic kidney disease with stage 1 through stage 4 chronic kidney disease, or unspecified chronic kidney disease: Secondary | ICD-10-CM

## 2019-07-02 DIAGNOSIS — F0150 Vascular dementia without behavioral disturbance: Secondary | ICD-10-CM

## 2019-07-02 DIAGNOSIS — E782 Mixed hyperlipidemia: Secondary | ICD-10-CM | POA: Diagnosis not present

## 2019-07-02 DIAGNOSIS — I482 Chronic atrial fibrillation, unspecified: Secondary | ICD-10-CM | POA: Insufficient documentation

## 2019-07-02 DIAGNOSIS — I10 Essential (primary) hypertension: Secondary | ICD-10-CM | POA: Diagnosis not present

## 2019-07-02 DIAGNOSIS — I4891 Unspecified atrial fibrillation: Secondary | ICD-10-CM | POA: Diagnosis not present

## 2019-07-02 DIAGNOSIS — N184 Chronic kidney disease, stage 4 (severe): Secondary | ICD-10-CM | POA: Insufficient documentation

## 2019-07-02 HISTORY — DX: Chronic kidney disease, stage 4 (severe): N18.4

## 2019-07-02 HISTORY — DX: Vascular dementia, unspecified severity, without behavioral disturbance, psychotic disturbance, mood disturbance, and anxiety: F01.50

## 2019-07-02 HISTORY — DX: Hypertensive chronic kidney disease with stage 1 through stage 4 chronic kidney disease, or unspecified chronic kidney disease: I12.9

## 2019-07-02 MED ORDER — HYDRALAZINE HCL 50 MG PO TABS
50.0000 mg | ORAL_TABLET | Freq: Two times a day (BID) | ORAL | 0 refills | Status: DC
Start: 2019-07-02 — End: 2019-10-01

## 2019-07-02 NOTE — Assessment & Plan Note (Signed)
Recommend continue to eat healthy. Recommended regular exercise.  Continue current medications.  Labs drawn today. Copy to be sent to Dr. Hollie Salk and Dr. Kirkland Hun.

## 2019-07-02 NOTE — Progress Notes (Signed)
Established Patient Office Visit  Subjective:  Patient ID: Sherry Hess, female    DOB: September 10, 1930  Age: 86 y.o. MRN: [id number]  CC:  Chief Complaint  Patient presents with  . Hyperlipidemia  . Gastroesophageal Reflux  . Hypertension  . Chronic Kidney Disease    HPI Sherry Hess presents for follow-up on hyperlipidemia, GERD, hypertension, and chronic kidney disease stage 4. Her son is with her today.  Past Medical History:  Diagnosis Date  . Anxiety   . Benign neoplasm of rectum   . Benign neoplasm of sigmoid colon   . Chest pain in adult 12/27/2015  . CVA (cerebral vascular accident) ([place]) 03/08/2017  . Dyspepsia 03/09/2017  . Essential hypertension 11/20/2014  . Fatigue 12/10/2016  . High cholesterol   . HLD (hyperlipidemia) 03/09/2017  . Hx of transient ischemic attack (TIA) 10/18/2016  . Hypertension   . Normocytic anemia   . Occult blood positive stool 03/09/2017  . Paroxysmal atrial fibrillation ([place]) 02/23/2017  . Syncope 12/10/2016  . TIA (transient ischemic attack)     Past Surgical History:  Procedure Laterality Date  . COLONOSCOPY N/A 03/11/2017   Procedure: COLONOSCOPY;  Surgeon: Gatha Mayer, MD;  Location: [facility] ENDOSCOPY;  Service: Endoscopy;  Laterality: N/A;  . ESOPHAGOGASTRODUODENOSCOPY N/A 03/11/2017   Procedure: ESOPHAGOGASTRODUODENOSCOPY (EGD);  Surgeon: Gatha Mayer, MD;  Location: [facility] ENDOSCOPY;  Service: Endoscopy;  Laterality: N/A;  . TONSILLECTOMY    . VESICOVAGINAL FISTULA CLOSURE W/ TAH      Family History  Problem Relation Age of Onset  . Cancer Father        lung  . Stroke Mother   . Cancer Brother        x2 bladder  . Ataxia Neg Hx   . Chorea Neg Hx   . Dementia Neg Hx   . Mental retardation Neg Hx   . Migraines Neg Hx   . Multiple sclerosis Neg Hx   . Neurofibromatosis Neg Hx   . Neuropathy Neg Hx   . Parkinsonism Neg Hx   . Seizures Neg Hx     Social History   Socioeconomic History  . Marital status:  Widowed    Spouse name: Not on file  . Number of children: Not on file  . Years of education: Not on file  . Highest education level: Not on file  Occupational History  . Not on file  Tobacco Use  . Smoking status: Never Smoker  . Smokeless tobacco: Never Used  Substance and Sexual Activity  . Alcohol use: No  . Drug use: No  . Sexual activity: Not on file  Other Topics Concern  . Not on file  Social History Narrative  . Not on file   Social Determinants of Health   Financial Resource Strain:   . Difficulty of Paying Living Expenses: Not on file  Food Insecurity:   . Worried About Charity fundraiser in the Last Year: Not on file  . Ran Out of Food in the Last Year: Not on file  Transportation Needs:   . Lack of Transportation (Medical): Not on file  . Lack of Transportation (Non-Medical): Not on file  Physical Activity:   . Days of Exercise per Week: Not on file  . Minutes of Exercise per Session: Not on file  Stress:   . Feeling of Stress : Not on file  Social Connections:   . Frequency of Communication with Friends and Family: Not on file  .  Frequency of Social Gatherings with Friends and Family: Not on file  . Attends Religious Services: Not on file  . Active Member of Clubs or Organizations: Not on file  . Attends Archivist Meetings: Not on file  . Marital Status: Not on file  Intimate Partner Violence:   . Fear of Current or Ex-Partner: Not on file  . Emotionally Abused: Not on file  . Physically Abused: Not on file  . Sexually Abused: Not on file    Outpatient Medications Prior to Visit  Medication Sig Dispense Refill  . apixaban (ELIQUIS) 2.5 MG TABS tablet Take 1 tablet (2.5 mg total) by mouth 2 (two) times daily. 60 tablet 6  . atorvastatin (LIPITOR) 20 MG tablet Take 20 mg by mouth daily.    . Calcium Citrate (CAL-CITRATE PO) Take by mouth daily.    . carvedilol (COREG) 12.5 MG tablet TAKE 1 TABLET BY MOUTH 2 TIMES DAILY WITH FOOD 180 tablet  1  . diltiazem (CARDIZEM CD) 180 MG 24 hr capsule Take 1 capsule (180 mg total) by mouth daily. Patient is due for follow up appointment.  Please call to schedule for future refills (Patient taking differently: Take 180 mg by mouth daily. ) 90 capsule 0  . Ferrous Sulfate ([employer]S SLOW RELEASE IRON PO) Take 1 tablet daily by mouth.    . Fish Oil-Cholecalciferol (FISH OIL + D3) 1000-1000 MG-UNIT CAPS Take by mouth.    Marland Kitchen FLUoxetine (PROZAC) 10 MG tablet Take 10 mg by mouth daily.    Marland Kitchen levothyroxine (SYNTHROID) 25 MCG tablet Take 25 mcg by mouth daily.     . memantine (NAMENDA) 10 MG tablet TAKE 1 TABLET BY MOUTH 2 TIMES DAILY 180 tablet 1  . Multiple Vitamin (MULTIVITAMIN) tablet Take 1 tablet by mouth daily.    Marland Kitchen omeprazole (PRILOSEC) 20 MG capsule Take 20 mg by mouth daily.     . hydrALAZINE (APRESOLINE) 100 MG tablet Take 100 mg by mouth 3 (three) times daily.    . hydrALAZINE (APRESOLINE) 50 MG tablet Take 50 mg by mouth 2 (two) times daily.     No facility-administered medications prior to visit.    Allergies  Allergen Reactions  . Minoxidil     rash  . Norvasc [Amlodipine Besylate]     rash  . Clonidine Derivatives Rash    [name]    ROS Review of Systems  Constitutional: Positive for fatigue. Negative for fever.       Mild  HENT: Negative for mouth sores, sore throat and tinnitus.   Eyes: Negative.  Negative for visual disturbance.  Respiratory: Negative for cough and shortness of breath.   Cardiovascular: Negative for chest pain, palpitations and leg swelling.  Endocrine: Negative for cold intolerance, heat intolerance, polydipsia, polyphagia and polyuria.  Genitourinary: Negative for difficulty urinating and urgency.  Musculoskeletal: Negative for arthralgias and myalgias.  Skin: Negative for rash.  Allergic/Immunologic: Negative for environmental allergies.  Neurological:       Memory loss per family member. She does not remember recent events such as doctor  appointments or conversations.  Psychiatric/Behavioral: Negative for agitation, confusion, hallucinations and sleep disturbance.      Objective:    Physical Exam  BP (!) 144/70 (BP Location: Left Arm, Patient Position: Sitting, Cuff Size: Normal)   Pulse 68   Temp 97.6 F (36.4 C)   Resp 16   Ht 5\' 2"  (1.575 m)   Wt 144 lb 6.4 oz (65.5 kg)   BMI  26.41 kg/m  Wt Readings from Last 3 Encounters:  07/02/19 144 lb 6.4 oz (65.5 kg)  06/06/19 145 lb (65.8 kg)  06/22/18 148 lb 6.4 oz (67.3 kg)     Health Maintenance Due  Topic Date Due  . TETANUS/TDAP  01/22/1952  . DEXA SCAN  01/21/1998  . PNA vac Low Risk Adult (1 of 2 - PCV13) 01/21/1998    There are no preventive care reminders to display for this patient.  Lab Results  Component Value Date   TSH 2.596 03/10/2017   Lab Results  Component Value Date   WBC 5.1 03/28/2017   HGB 12.7 03/28/2017   HCT 38.6 03/28/2017   MCV 81 03/28/2017   PLT 276 03/28/2017   Lab Results  Component Value Date   NA 132 (L) 07/06/2017   K 4.3 07/06/2017   CO2 25 07/06/2017   GLUCOSE 102 (H) 07/06/2017   BUN 33 (H) 07/06/2017   CREATININE 1.47 (H) 07/06/2017   BILITOT 0.8 03/08/2017   ALKPHOS 59 03/08/2017   AST 19 03/08/2017   ALT 12 (L) 03/08/2017   PROT 5.8 (L) 03/08/2017   ALBUMIN 3.2 (L) 03/08/2017   CALCIUM 9.5 07/06/2017   ANIONGAP 13 03/12/2017   Lab Results  Component Value Date   CHOL 117 03/09/2017   Lab Results  Component Value Date   HDL 36 (L) 03/09/2017   Lab Results  Component Value Date   LDLCALC 71 03/09/2017   Lab Results  Component Value Date   TRIG 50 03/09/2017   Lab Results  Component Value Date   CHOLHDL 3.3 03/09/2017   Lab Results  Component Value Date   HGBA1C 5.2 03/09/2017      Assessment & Plan:   Problem List Items Addressed This Visit      Cardiovascular and Mediastinum   Atrial fibrillation (HCC)   Relevant Medications   hydrALAZINE (APRESOLINE) 50 MG tablet      Nervous and Auditory   Vascular dementia without behavioral disturbance ([place])    Recommend continue to eat healthy. Recommended regular exercise.  Continue current medications.  Labs drawn today. Copy to be sent to Dr. Hollie Salk and Dr. Kirkland Hun.        Genitourinary   Hypertension, renal disease, stage 1-4 or unspecified chronic kidney disease - Primary    Recommend continue to eat healthy. Recommended regular exercise.  Continue current medications.  Labs drawn today. Copy to be sent to Dr. Hollie Salk and Dr. Kirkland Hun.      Relevant Orders   CBC with Differential/Platelet   Lipid panel   Comp. Metabolic Panel (12)   CKD (chronic kidney disease) stage 4, GFR 15-29 ml/min (HCC)    Recommend continue to eat healthy. Recommended regular exercise.  Continue current medications.  Labs drawn today. Copy to be sent to Dr. Hollie Salk and Dr. Kirkland Hun.        Other   Mixed hyperlipidemia    Recommend continue to eat healthy. Recommended regular exercise.  Continue current medications.  Labs drawn today. Copy to be sent to Dr. Hollie Salk and Dr. Kirkland Hun.      Relevant Medications   hydrALAZINE (APRESOLINE) 50 MG tablet      Meds ordered this encounter  Medications  . hydrALAZINE (APRESOLINE) 50 MG tablet    Sig: Take 1 tablet (50 mg total) by mouth 2 (two) times daily.    Dispense:  180 tablet    Refill:  0    Follow-up: Return in about 3 months (around  09/29/2019) for fasting.    Rip Harbour, RN

## 2019-07-02 NOTE — Patient Instructions (Signed)
Recommend continue to eat healthy. Recommended regular exercise.  Continue current medications.  Labs drawn today. Copy to be sent to Dr. Hollie Salk and Dr. Kirkland Hun.

## 2019-07-02 NOTE — Progress Notes (Signed)
Subjective:  Patient ID: Sherry Hess, female    DOB: 11/29/1930  Age: 86 y.o. MRN: [id number]  Chief Complaint  Patient presents with  . Hyperlipidemia  . Gastroesophageal Reflux  . Hypertension  . Chronic Kidney Disease    HPI  Follow up of Hypertension with Stage 4 CKD. Patient sees Dr. Hollie Salk, nephrology. [name] taking hydralazine, carvedilol and cardizem.  Memory loss short term due to her past strokes. Handles own finances, self care. Unable to drive now. Lives with her son.  Atrial fibrillation is paroxysmal. She sees Dr. Kirkland Hun. Currently on cardizem cd and eliquis. Is unaware when she goes in to atrial fibrillation.   Social Hx   Social History   Socioeconomic History  . Marital status: Widowed    Spouse name: Not on file  . Number of children: Not on file  . Years of education: Not on file  . Highest education level: Not on file  Occupational History  . Not on file  Tobacco Use  . Smoking status: Never Smoker  . Smokeless tobacco: Never Used  Substance and Sexual Activity  . Alcohol use: No  . Drug use: No  . Sexual activity: Not on file  Other Topics Concern  . Not on file  Social History Narrative  . Not on file   Social Determinants of Health   Financial Resource Strain:   . Difficulty of Paying Living Expenses: Not on file  Food Insecurity:   . Worried About Charity fundraiser in the Last Year: Not on file  . Ran Out of Food in the Last Year: Not on file  Transportation Needs:   . Lack of Transportation (Medical): Not on file  . Lack of Transportation (Non-Medical): Not on file  Physical Activity:   . Days of Exercise per Week: Not on file  . Minutes of Exercise per Session: Not on file  Stress:   . Feeling of Stress : Not on file  Social Connections:   . Frequency of Communication with Friends and Family: Not on file  . Frequency of Social Gatherings with Friends and Family: Not on file  . Attends Religious Services: Not on file  .  Active Member of Clubs or Organizations: Not on file  . Attends Archivist Meetings: Not on file  . Marital Status: Not on file   Past Medical History:  Diagnosis Date  . Anxiety   . Benign neoplasm of rectum   . Benign neoplasm of sigmoid colon   . Chest pain in adult 12/27/2015  . CVA (cerebral vascular accident) ([place]) 03/08/2017  . Dyspepsia 03/09/2017  . Essential hypertension 11/20/2014  . Fatigue 12/10/2016  . High cholesterol   . HLD (hyperlipidemia) 03/09/2017  . Hx of transient ischemic attack (TIA) 10/18/2016  . Hypertension   . Normocytic anemia   . Occult blood positive stool 03/09/2017  . Paroxysmal atrial fibrillation ([place]) 02/23/2017  . Syncope 12/10/2016  . TIA (transient ischemic attack)    The patient has a family history of  Review of Systems  Constitutional: Negative for chills, fatigue and fever.  HENT: Negative for congestion, ear pain and sore throat.   Respiratory: Negative for cough and shortness of breath.   Cardiovascular: Negative for chest pain.  Gastrointestinal: Negative for abdominal pain, constipation, diarrhea, nausea and vomiting.  Endocrine: Negative for polydipsia, polyphagia and polyuria.  Genitourinary: Negative for dysuria and urgency.  Musculoskeletal: Negative for arthralgias and myalgias.  Neurological: Negative for dizziness and headaches.  Psychiatric/Behavioral: Negative for dysphoric mood. The patient is not nervous/anxious.      Objective:  BP (!) 144/70 (BP Location: Left Arm, Patient Position: Sitting, Cuff Size: Normal)   Pulse 68   Temp 97.6 F (36.4 C)   Resp 16   Ht 5\' 2"  (1.575 m)   Wt 144 lb 6.4 oz (65.5 kg)   BMI 26.41 kg/m   BP/Weight 07/02/2019 6/96/2952 12/19/1322  Systolic BP 401 027 253  Diastolic BP 70 72 70  Wt. (Lbs) 144.4 145 148.4  BMI 26.41 25.28 25.88    Physical Exam Vitals reviewed.  Constitutional:      General: [name] not in acute distress.    Appearance: Normal appearance. [name]  obese.  HENT:     Right Ear: Tympanic membrane and ear canal normal.     Left Ear: Tympanic membrane and ear canal normal.     Nose: Nose normal. No congestion or rhinorrhea.  Eyes:     Conjunctiva/sclera: Conjunctivae normal.  Neck:     Thyroid: No thyroid mass.  Cardiovascular:     Rate and Rhythm: Normal rate and regular rhythm.     Pulses: Normal pulses.     Heart sounds: No murmur.  Pulmonary:     Effort: Pulmonary effort is normal.     Breath sounds: Normal breath sounds.  Abdominal:     General: Bowel sounds are normal.     Palpations: Abdomen is soft. There is no mass.     Tenderness: There is no abdominal tenderness.  Musculoskeletal:        General: Normal range of motion.  Lymphadenopathy:     Cervical: No cervical adenopathy.  Skin:    General: Skin is warm and dry.  Neurological:     Mental Status: [name] alert.     Cranial Nerves: No cranial nerve deficit.  Psychiatric:        Mood and Affect: Mood normal.        Behavior: Behavior normal.     Lab Results  Component Value Date   WBC 5.1 03/28/2017   HGB 12.7 03/28/2017   HCT 38.6 03/28/2017   PLT 276 03/28/2017   GLUCOSE 102 (H) 07/06/2017   CHOL 117 03/09/2017   TRIG 50 03/09/2017   HDL 36 (L) 03/09/2017   LDLCALC 71 03/09/2017   ALT 12 (L) 03/08/2017   AST 19 03/08/2017   NA 132 (L) 07/06/2017   K 4.3 07/06/2017   CL 92 (L) 07/06/2017   CREATININE 1.47 (H) 07/06/2017   BUN 33 (H) 07/06/2017   CO2 25 07/06/2017   TSH 2.596 03/10/2017   INR 1.1 01/26/2017   HGBA1C 5.2 03/09/2017      Assessment & Plan:   Problem List Items Addressed This Visit      Cardiovascular and Mediastinum   Atrial fibrillation (HCC)   Relevant Medications   hydrALAZINE (APRESOLINE) 50 MG tablet     Nervous and Auditory   Vascular dementia without behavioral disturbance ([place])    Recommend continue to eat healthy. Recommended regular exercise.  Continue current medications.  Labs drawn today. Copy to be sent  to Dr. Hollie Salk and Dr. Kirkland Hun.        Genitourinary   Hypertension, renal disease, stage 1-4 or unspecified chronic kidney disease - Primary    Recommend continue to eat healthy. Recommended regular exercise.  Continue current medications.  Labs drawn today. Copy to be sent to Dr. Hollie Salk and Dr. Kirkland Hun.  Relevant Orders   CBC with Differential/Platelet   Lipid panel   Comp. Metabolic Panel (12)   CKD (chronic kidney disease) stage 4, GFR 15-29 ml/min (HCC)    Recommend continue to eat healthy. Recommended regular exercise.  Continue current medications.  Labs drawn today. Copy to be sent to Dr. Hollie Salk and Dr. Kirkland Hun.        Other   Mixed hyperlipidemia    Recommend continue to eat healthy. Recommended regular exercise.  Continue current medications.  Labs drawn today. Copy to be sent to Dr. Hollie Salk and Dr. Kirkland Hun.      Relevant Medications   hydrALAZINE (APRESOLINE) 50 MG tablet      Mixed hyperlipidemia Recommend continue to eat healthy. Recommended regular exercise.  Continue current medications.  Labs drawn today. Copy to be sent to Dr. Hollie Salk and Dr. Kirkland Hun.  CKD (chronic kidney disease) stage 4, GFR 15-29 ml/min (HCC) Recommend continue to eat healthy. Recommended regular exercise.  Continue current medications.  Labs drawn today. Copy to be sent to Dr. Hollie Salk and Dr. Kirkland Hun.  Hypertension, renal disease, stage 1-4 or unspecified chronic kidney disease Recommend continue to eat healthy. Recommended regular exercise.  Continue current medications.  Labs drawn today. Copy to be sent to Dr. Hollie Salk and Dr. Kirkland Hun.  Vascular dementia without behavioral disturbance ([place]) Recommend continue to eat healthy. Recommended regular exercise.  Continue current medications.  Labs drawn today. Copy to be sent to Dr. Hollie Salk and Dr. Kirkland Hun.   Meds ordered this encounter  Medications  . hydrALAZINE (APRESOLINE) 50 MG tablet    Sig: Take 1 tablet (50 mg  total) by mouth 2 (two) times daily.    Dispense:  180 tablet    Refill:  0    AN INDIVIDUALIZED CARE PLAN: was established or reinforced today.   [name] disease status was assessed using clinical findings on exam, labs, and/or other diagnostic testing, such as xrays, to determine his/her success in meeting treatment goals based on disease specific evidence-based guidelines and found to be well controlled.   SELF MANAGEMENT: The patient and I together assessed ways to personally work towards obtaining the recommended goals  Support needs The patient and/or family needs were assessed and services were offered and not necessary at this time.    Follow-up: Return in about 3 months (around 09/29/2019) for fasting.  A CLINICAL SUMMARY including a written plan identify barriers to care unique to individual due to social or financial issues and help create solutions together. and a patient's and the patient's families understanding of their medical issues and care needs   [place] [phone]

## 2019-07-03 LAB — COMP. METABOLIC PANEL (12)
AST: 29 IU/L (ref 0–40)
Albumin/Globulin Ratio: 1.4 (ref 1.2–2.2)
Albumin: 3.9 g/dL (ref 3.6–4.6)
Alkaline Phosphatase: 67 IU/L (ref 39–117)
BUN/Creatinine Ratio: 11 — ABNORMAL LOW (ref 12–28)
BUN: 22 mg/dL (ref 8–27)
Bilirubin Total: 0.5 mg/dL (ref 0.0–1.2)
Calcium: 9.4 mg/dL (ref 8.7–10.3)
Chloride: 101 mmol/L (ref 96–106)
Creatinine, Ser: 1.93 mg/dL — ABNORMAL HIGH (ref 0.57–1.00)
GFR calc Af Amer: 27 mL/min/{1.73_m2} — ABNORMAL LOW (ref >59)
GFR calc non Af Amer: 23 mL/min/{1.73_m2} — ABNORMAL LOW (ref >59)
Globulin, Total: 2.7 g/dL (ref 1.5–4.5)
Glucose: 87 mg/dL (ref 65–99)
Potassium: 4.4 mmol/L (ref 3.5–5.2)
Sodium: 142 mmol/L (ref 134–144)
Total Protein: 6.6 g/dL (ref 6.0–8.5)

## 2019-07-03 LAB — CBC WITH DIFFERENTIAL/PLATELET
Basophils Absolute: 0 10*3/uL (ref 0.0–0.2)
Basos: 1 %
EOS (ABSOLUTE): 0 10*3/uL (ref 0.0–0.4)
Eos: 0 %
Hematocrit: 40.6 % (ref 34.0–46.6)
Hemoglobin: 13.9 g/dL (ref 11.1–15.9)
Immature Grans (Abs): 0 10*3/uL (ref 0.0–0.1)
Immature Granulocytes: 1 %
Lymphocytes Absolute: 0.7 10*3/uL (ref 0.7–3.1)
Lymphs: 20 %
MCH: 29.6 pg (ref 26.6–33.0)
MCHC: 34.2 g/dL (ref 31.5–35.7)
MCV: 86 fL (ref 79–97)
Monocytes Absolute: 0.4 10*3/uL (ref 0.1–0.9)
Monocytes: 13 %
Neutrophils Absolute: 2.3 10*3/uL (ref 1.4–7.0)
Neutrophils: 65 %
Platelets: 277 10*3/uL (ref 150–450)
RBC: 4.7 x10E6/uL (ref 3.77–5.28)
RDW: 14.3 % (ref 11.7–15.4)
WBC: 3.5 10*3/uL (ref 3.4–10.8)

## 2019-07-03 LAB — LIPID PANEL
Chol/HDL Ratio: 4 ratio (ref 0.0–4.4)
Cholesterol, Total: 113 mg/dL (ref 100–199)
HDL: 28 mg/dL — ABNORMAL LOW (ref >39)
LDL Chol Calc (NIH): 67 mg/dL (ref 0–99)
Triglycerides: 92 mg/dL (ref 0–149)
VLDL Cholesterol Cal: 18 mg/dL (ref 5–40)

## 2019-07-03 LAB — CARDIOVASCULAR RISK ASSESSMENT

## 2019-07-03 NOTE — Progress Notes (Signed)
Subjective:  Patient ID: Sherry Hess, female    DOB: 10-07-1930  Age: 86 y.o. MRN: [id number]  Chief Complaint  Patient presents with  . Hyperlipidemia  . Gastroesophageal Reflux  . Hypertension  . Chronic Kidney Disease    Hyperlipidemia This is a chronic problem. The current episode started more than 1 year ago. The problem is controlled. There are no known factors aggravating her hyperlipidemia. Pertinent negatives include no chest pain, myalgias or shortness of breath. (Atorvastatin.) Current antihyperlipidemic treatment includes statins and diet change. There are no compliance problems.   Gastroesophageal Reflux She reports no abdominal pain, no chest pain, no coughing, no nausea or no sore throat. This is a chronic problem. The current episode started more than 1 year ago. The problem occurs rarely. Associated symptoms include fatigue. She has tried a PPI for the symptoms. The treatment provided significant relief.  Hypertension This is a chronic problem. The current episode started more than 1 year ago. The problem is controlled. Pertinent negatives include no chest pain, headaches or shortness of breath. There are no associated agents to hypertension. There are no compliance problems.  Hypertensive end-organ damage includes kidney disease.   Follow up of Hypertension with Stage 4 CKD. Patient sees Dr. Hollie Salk, nephrology. [name] taking hydralazine, carvedilol and cardizem.  Memory loss short term due to her past strokes. Handles own finances, self care. Unable to drive now. Lives with her son.  Atrial fibrillation is paroxysmal. She sees Dr. Kirkland Hun. Currently on cardizem cd and eliquis. Is unaware when she goes in to atrial fibrillation.   Social Hx   Social History   Socioeconomic History  . Marital status: Widowed    Spouse name: Not on file  . Number of children: Not on file  . Years of education: Not on file  . Highest education level: Not on file  Occupational  History  . Not on file  Tobacco Use  . Smoking status: Never Smoker  . Smokeless tobacco: Never Used  Substance and Sexual Activity  . Alcohol use: No  . Drug use: No  . Sexual activity: Not on file  Other Topics Concern  . Not on file  Social History Narrative  . Not on file   Social Determinants of Health   Financial Resource Strain:   . Difficulty of Paying Living Expenses: Not on file  Food Insecurity:   . Worried About Charity fundraiser in the Last Year: Not on file  . Ran Out of Food in the Last Year: Not on file  Transportation Needs:   . Lack of Transportation (Medical): Not on file  . Lack of Transportation (Non-Medical): Not on file  Physical Activity:   . Days of Exercise per Week: Not on file  . Minutes of Exercise per Session: Not on file  Stress:   . Feeling of Stress : Not on file  Social Connections:   . Frequency of Communication with Friends and Family: Not on file  . Frequency of Social Gatherings with Friends and Family: Not on file  . Attends Religious Services: Not on file  . Active Member of Clubs or Organizations: Not on file  . Attends Archivist Meetings: Not on file  . Marital Status: Not on file   Past Medical History:  Diagnosis Date  . Anxiety   . Benign neoplasm of rectum   . Benign neoplasm of sigmoid colon   . Chest pain in adult 12/27/2015  . CVA (cerebral vascular  accident) ([place]) 03/08/2017  . Dyspepsia 03/09/2017  . Essential hypertension 11/20/2014  . Fatigue 12/10/2016  . High cholesterol   . HLD (hyperlipidemia) 03/09/2017  . Hx of transient ischemic attack (TIA) 10/18/2016  . Hypertension   . Normocytic anemia   . Occult blood positive stool 03/09/2017  . Paroxysmal atrial fibrillation ([place]) 02/23/2017  . Syncope 12/10/2016  . TIA (transient ischemic attack)    The patient has a family history of  Review of Systems  Constitutional: Positive for fatigue. Negative for chills and fever.  HENT: Negative for  congestion, ear pain and sore throat.   Respiratory: Negative for cough and shortness of breath.   Cardiovascular: Negative for chest pain.  Gastrointestinal: Negative for abdominal pain, constipation, diarrhea, nausea and vomiting.  Endocrine: Negative for polydipsia, polyphagia and polyuria.  Genitourinary: Negative for dysuria and urgency.  Musculoskeletal: Negative for arthralgias and myalgias.  Neurological: Negative for dizziness and headaches.  Psychiatric/Behavioral: Negative for dysphoric mood. The patient is not nervous/anxious.      Objective:  BP (!) 144/70 (BP Location: Left Arm, Patient Position: Sitting, Cuff Size: Normal)   Pulse 68   Temp 97.6 F (36.4 C)   Resp 16   Ht 5\' 2"  (1.575 m)   Wt 144 lb 6.4 oz (65.5 kg)   BMI 26.41 kg/m   BP/Weight 07/02/2019 2/87/8676 11/15/945  Systolic BP 096 283 662  Diastolic BP 70 72 70  Wt. (Lbs) 144.4 145 148.4  BMI 26.41 25.28 25.88    Physical Exam Vitals reviewed.  Constitutional:      General: [name] not in acute distress.    Appearance: Normal appearance. [name] normal weight.  HENT:     Right Ear: Tympanic membrane and ear canal normal.     Left Ear: Tympanic membrane and ear canal normal.     Nose: Nose normal. No congestion or rhinorrhea.  Eyes:     Conjunctiva/sclera: Conjunctivae normal.  Neck:     Thyroid: No thyroid mass.  Cardiovascular:     Rate and Rhythm: Normal rate and regular rhythm.     Pulses: Normal pulses.     Heart sounds: No murmur.  Pulmonary:     Effort: Pulmonary effort is normal.     Breath sounds: Normal breath sounds.  Abdominal:     General: Bowel sounds are normal.     Palpations: Abdomen is soft. There is no mass.     Tenderness: There is no abdominal tenderness.  Musculoskeletal:        General: Normal range of motion.  Lymphadenopathy:     Cervical: No cervical adenopathy.  Skin:    General: Skin is warm and dry.  Neurological:     Mental Status: [name] alert. Mental  status is at baseline.     Cranial Nerves: No cranial nerve deficit.  Psychiatric:        Mood and Affect: Mood normal.        Behavior: Behavior normal.     Lab Results  Component Value Date   WBC 3.5 07/02/2019   HGB 13.9 07/02/2019   HCT 40.6 07/02/2019   PLT 277 07/02/2019   GLUCOSE 87 07/02/2019   CHOL 113 07/02/2019   TRIG 92 07/02/2019   HDL 28 (L) 07/02/2019   LDLCALC 67 07/02/2019   ALT 12 (L) 03/08/2017   AST 29 07/02/2019   NA 142 07/02/2019   K 4.4 07/02/2019   CL 101 07/02/2019   CREATININE 1.93 (H) 07/02/2019  BUN 22 07/02/2019   CO2 25 07/06/2017   TSH 2.596 03/10/2017   INR 1.1 01/26/2017   HGBA1C 5.2 03/09/2017      Assessment & Plan:   Problem List Items Addressed This Visit      Cardiovascular and Mediastinum   Atrial fibrillation (HCC)   Relevant Medications   hydrALAZINE (APRESOLINE) 50 MG tablet     Nervous and Auditory   Vascular dementia without behavioral disturbance ([place])    Recommend continue to eat healthy. Recommended regular exercise.  Continue current medications.  Labs drawn today. Copy to be sent to Dr. Hollie Salk and Dr. Kirkland Hun.        Genitourinary   Hypertension, renal disease, stage 1-4 or unspecified chronic kidney disease - Primary    Recommend continue to eat healthy. Recommended regular exercise.  Continue current medications.  Labs drawn today. Copy to be sent to Dr. Hollie Salk and Dr. Kirkland Hun.      Relevant Orders   CBC with Differential/Platelet (Completed)   Lipid panel (Completed)   Comp. Metabolic Panel (12) (Completed)   CKD (chronic kidney disease) stage 4, GFR 15-29 ml/min (HCC)    Recommend continue to eat healthy. Recommended regular exercise.  Continue current medications.  Labs drawn today. Copy to be sent to Dr. Hollie Salk and Dr. Kirkland Hun.        Other   Mixed hyperlipidemia    Recommend continue to eat healthy. Recommended regular exercise.  Continue current medications.  Labs drawn today. Copy  to be sent to Dr. Hollie Salk and Dr. Kirkland Hun.      Relevant Medications   hydrALAZINE (APRESOLINE) 50 MG tablet      Mixed hyperlipidemia Recommend continue to eat healthy. Recommended regular exercise.  Continue current medications.  Labs drawn today. Copy to be sent to Dr. Hollie Salk and Dr. Kirkland Hun.  CKD (chronic kidney disease) stage 4, GFR 15-29 ml/min (HCC) Recommend continue to eat healthy. Recommended regular exercise.  Continue current medications.  Labs drawn today. Copy to be sent to Dr. Hollie Salk and Dr. Kirkland Hun.  Hypertension, renal disease, stage 1-4 or unspecified chronic kidney disease Recommend continue to eat healthy. Recommended regular exercise.  Continue current medications.  Labs drawn today. Copy to be sent to Dr. Hollie Salk and Dr. Kirkland Hun.  Vascular dementia without behavioral disturbance ([place]) Recommend continue to eat healthy. Recommended regular exercise.  Continue current medications.  Labs drawn today. Copy to be sent to Dr. Hollie Salk and Dr. Kirkland Hun.   Meds ordered this encounter  Medications  . hydrALAZINE (APRESOLINE) 50 MG tablet    Sig: Take 1 tablet (50 mg total) by mouth 2 (two) times daily.    Dispense:  180 tablet    Refill:  0    AN INDIVIDUALIZED CARE PLAN: was established or reinforced today.   [name] disease status was assessed using clinical findings on exam, labs, and/or other diagnostic testing, such as xrays, to determine his/her success in meeting treatment goals based on disease specific evidence-based guidelines and found to be well controlled.   SELF MANAGEMENT: The patient and I together assessed ways to personally work towards obtaining the recommended goals  Support needs The patient and/or family needs were assessed and services were offered and not necessary at this time.    Follow-up: Return in about 3 months (around 09/29/2019) for fasting.  A CLINICAL SUMMARY including a written plan identify barriers to care unique to  individual due to social or financial issues and help create solutions together. and a patient's and  the patient's families understanding of their medical issues and care needs   [place] [phone]

## 2019-07-11 ENCOUNTER — Other Ambulatory Visit: Payer: Self-pay | Admitting: Family Medicine

## 2019-07-27 DIAGNOSIS — D649 Anemia, unspecified: Secondary | ICD-10-CM | POA: Diagnosis not present

## 2019-07-27 DIAGNOSIS — E871 Hypo-osmolality and hyponatremia: Secondary | ICD-10-CM | POA: Diagnosis not present

## 2019-07-27 DIAGNOSIS — I129 Hypertensive chronic kidney disease with stage 1 through stage 4 chronic kidney disease, or unspecified chronic kidney disease: Secondary | ICD-10-CM | POA: Diagnosis not present

## 2019-07-27 DIAGNOSIS — N2581 Secondary hyperparathyroidism of renal origin: Secondary | ICD-10-CM | POA: Diagnosis not present

## 2019-07-27 DIAGNOSIS — N1832 Chronic kidney disease, stage 3b: Secondary | ICD-10-CM | POA: Diagnosis not present

## 2019-08-13 ENCOUNTER — Other Ambulatory Visit: Payer: Self-pay | Admitting: Family Medicine

## 2019-08-13 ENCOUNTER — Other Ambulatory Visit: Payer: Self-pay | Admitting: Cardiology

## 2019-08-24 ENCOUNTER — Telehealth: Payer: Self-pay | Admitting: Family Medicine

## 2019-08-24 NOTE — Progress Notes (Signed)
  Chronic Care Management   Note  08/24/2019 Name: Sherry Hess MRN: [id number] DOB: [birth year]  Sherry Hess is a 86 y.o. year old female who is a primary care patient of Cox, Kirsten, MD. I reached out to [name] by phone today in response to a referral sent by [name]'s PCP, Cox, Kirsten, MD.   Sherry Hess was given information about Chronic Care Management services today including:  1. CCM service includes personalized support from designated clinical staff supervised by her physician, including individualized plan of care and coordination with other care providers 2. 24/7 contact phone numbers for assistance for urgent and routine care needs. 3. Service will only be billed when office clinical staff spend 20 minutes or more in a month to coordinate care. 4. Only one practitioner may furnish and bill the service in a calendar month. 5. The patient may stop CCM services at any time (effective at the end of the month) by phone call to the office staff.   Patient agreed to services and verbal consent obtained.   Follow up plan:   Earney [place] [employer] Scheduler

## 2019-09-02 NOTE — Chronic Care Management (AMB) (Signed)
Chronic Care Management Pharmacy  Name: Sherry Hess  MRN: [id number] DOB: 26-Dec-1930  Chief Complaint/ HPI  Sherry Hess,  86 y.o. , female presents for their Initial CCM visit with the clinical pharmacist via telephone due to COVID-19 Pandemic.  PCP : [name], MD  Their chronic conditions include: Hypertension, Afib, CKD, Vascular Dementia, Dyspepsia, HLD, Anemia.   Office Visits: 07/02/2019 - Office visit with labs drawn and refilled hydralazine.   Consult Visit: 06/06/2019 - Cardio visit with Dr. Raliegh Ip. No medicine changes. 06/01/2019 - Dr. Hollie Salk for CKD.  05/03/2019 - Dr. Bobby Rumpf for iron deficiency anemia.   Medications: Outpatient Encounter Medications as of 09/04/2019  Medication Sig  . atorvastatin (LIPITOR) 20 MG tablet TAKE 1 TABLET BY MOUTH DAILY  . carvedilol (COREG) 12.5 MG tablet TAKE 1 TABLET BY MOUTH 2 TIMES DAILY WITH FOOD  . diltiazem (CARDIZEM CD) 180 MG 24 hr capsule TAKE 1 CAPSULE BY MOUTH DAILY  . ELIQUIS 2.5 MG TABS tablet TAKE 1 TABLET BY MOUTH TWICE DAILY  . Ferrous Sulfate ([facility] SLOW RELEASE IRON PO) Take 1 tablet daily by mouth.  Marland Kitchen FLUoxetine (PROZAC) 10 MG capsule TAKE 1 CAPSULE BY MOUTH ONCE DAILY  . hydrALAZINE (APRESOLINE) 50 MG tablet Take 1 tablet (50 mg total) by mouth 2 (two) times daily.  Marland Kitchen levothyroxine (SYNTHROID) 25 MCG tablet Take 25 mcg by mouth daily.   . memantine (NAMENDA) 10 MG tablet TAKE 1 TABLET BY MOUTH 2 TIMES DAILY  . sodium chloride 1 g tablet Take 1 g by mouth daily.  . Calcium Citrate (CAL-CITRATE PO) Take by mouth daily.  . Fish Oil-Cholecalciferol (FISH OIL + D3) 1000-1000 MG-UNIT CAPS Take by mouth.  Marland Kitchen FLUoxetine (PROZAC) 10 MG tablet Take 10 mg by mouth daily.  . Multiple Vitamin (MULTIVITAMIN) tablet Take 1 tablet by mouth daily.  Marland Kitchen omeprazole (PRILOSEC) 20 MG capsule Take 20 mg by mouth daily.    No facility-administered encounter medications on file as of 09/04/2019.   Allergies  Allergen Reactions  .  Minoxidil     rash  . Norvasc [Amlodipine Besylate]     rash  . Clonidine Derivatives Rash    [name]   SDOH Screenings   Alcohol Screen:   . Last Alcohol Screening Score (AUDIT):   Depression (PHQ2-9): Low Risk   . PHQ-2 Score: 0  Financial Resource Strain:   . Difficulty of Paying Living Expenses:   Food Insecurity:   . Worried About Charity fundraiser in the Last Year:   . [employer] of Food in the Last Year:   Housing: [place]   . Last Housing Risk Score: 0  Physical Activity:   . Days of Exercise per Week:   . Minutes of Exercise per Session:   Social Connections:   . Frequency of Communication with Friends and Family:   . Frequency of Social Gatherings with Friends and Family:   . Attends Religious Services:   . Active Member of Clubs or Organizations:   . Attends Archivist Meetings:   Marland Kitchen Marital Status:   Stress:   . Feeling of Stress :   Tobacco Use: Low Risk   . Smoking Tobacco Use: Never Smoker  . Smokeless Tobacco Use: Never Used  Transportation Needs: No Transportation Needs  . Lack of Transportation (Medical): No  . Lack of Transportation (Non-Medical): No    Current Diagnosis/Assessment:  Goals Addressed  This Visit's Progress   . Pharmacy Care Plan HLD       CARE PLAN ENTRY (see longitudinal plan of care for additional care plan information)  Current Barriers:  . Uncontrolled hyperlipidemia, complicated by age and HTN . Current antihyperlipidemic regimen: atorvastatin . Previous antihyperlipidemic medications tried none noted . Most recent lipid panel:     Component Value Date/Time   CHOL 113 07/02/2019 1107   TRIG 92 07/02/2019 1107   HDL 28 (L) 07/02/2019 1107   CHOLHDL 4.0 07/02/2019 1107   CHOLHDL 3.3 03/09/2017 0226   VLDL 10 03/09/2017 0226   LDLCALC 67 07/02/2019 1107   . ASCVD risk enhancing conditions: age >61, DM, HTN, CKD, CHF, current smoker   Pharmacist Clinical Goal(s):  Marland Kitchen Over the next  30 days, patient will work with PharmD and providers towards optimized antihyperlipidemic therapy  Interventions: . Comprehensive medication review performed; medication list updated in electronic medical record.  Bertram Savin care team collaboration (see longitudinal plan of care) . Ensure safety, efficacy, and affordability of medications   Patient Self Care Activities:  . Patient will focus on medication adherence by continuing to use weekly medication organizer. Pharmacist will follow-up in 30 days.  . Patient will continue to eat a heart healthy meal of lean protein and vegetables daily. Pharmacist will follow-up in 30 days.   Initial goal documentation     . Pharmacy Care Plan HTN       CARE PLAN ENTRY (see longitudinal plan of care for additional care plan information)  Current Barriers:  . Uncontrolled hypertension, complicated by age . Current antihypertensive regimen: diltiazem, hydralazine, carvedilol . Previous antihypertensives tried: n/a . Last practice recorded BP readings:  BP Readings from Last 3 Encounters:  07/02/19 (!) 144/70  06/06/19 (!) 142/72  06/22/18 138/70 .   Marland Kitchen Current home BP readings: 144-151/63-69 mmHg . Most recent eGFR/CrCl: No results found for: EGFR  No components found for: CRCL  Pharmacist Clinical Goal(s):  Marland Kitchen Over the next 30 days, patient will work with PharmD and providers to optimize antihypertensive regimen . Ensure safety, efficacy, and affordability of medications   Interventions: . Inter-disciplinary care team collaboration (see longitudinal plan of care) . Comprehensive medication review performed; medication list updated in the electronic medical record.    Patient Self Care Activities:  . Patient will continue to check BP weekly , document, and provide at future appointments . Patient will focus on medication adherence by continuing to use weekly pill orgainzer for optimal adherence.  Initial goal documentation          AFIB   Patient is currently rate controlled.  Patient has failed these meds in past: n/a Patient is currently controlled on the following medications: carvedilol 12.5 mg bid, eliquis 2.5 mg bid  We discussed:  diet and exercise extensively. Patient does not indicate any problems. [name] not currently exercising but hopes to resume with a neighbor. Patient states that she has a side road nearby that she can walk on if with a friend. Pharmacist encouraged patient to safely walk for activity within her home and driveway if alone.   Plan  Continue current medications   Hyperlipidemia   Lipid Panel     Component Value Date/Time   CHOL 113 07/02/2019 1107   TRIG 92 07/02/2019 1107   HDL 28 (L) 07/02/2019 1107   CHOLHDL 4.0 07/02/2019 1107   CHOLHDL 3.3 03/09/2017 0226   VLDL 10 03/09/2017 0226   LDLCALC 67 07/02/2019 1107  LABVLDL 18 07/02/2019 1107     The ASCVD Risk score Mikey Bussing DC Jr., et al., 2013) failed to calculate for the following reasons:   The 2013 ASCVD risk score is only valid for ages 9 to 63   The patient has a prior MI or stroke diagnosis   Patient has failed these meds in past: n/a Patient is currently controlled on the following medications: Atorvastatin 20 mg daily  We discussed:  diet and exercise extensively. Cooks for her son who lives with him. Needs to start walking again with neighbor.    Plan  Continue current medications   ,  Hypertension   BP today is: above goal of <140/90 at home recently.  Office blood pressures are  BP Readings from Last 3 Encounters:  07/02/19 (!) 144/70  06/06/19 (!) 142/72  06/22/18 138/70    Patient has failed these meds in the past: none recalled Patient is currently uncontrolled on the following medications: diltiazem cd 180 mg daily, hydralazine 50 mg bid  Patient checks BP at home weekly  Patient home BP readings are ranging: 144-151/63-68 mmHg  We discussed diet and exercise extensively. Likes  to eat "regular food". Meat and a couple vegetables (green beans, green peas, potatoes). Chicken is favorite meat. Occasionally eats pork chops. Ate doughnuts for breakfast today.Patient educated on sodium intake and any fluid retention.  Plan  Continue current medications   Hypothyroidism   Patient has failed these meds in past: n/a Patient is currently controlled on the following medications: levothyroxine 25 mcg daily  We discussed: Patient could not find levothyroxine at home. Pharmacy said filled last month and picked up for 90 days. Patient will look for it today and pharmacist will follow up with a call tomorrow. If patient cannot locate medication, pharmacist will coordinate early refill with pharmacy.  Plan  Continue current medications    and   Dementia   Patient has failed these meds in past: n/a Patient is currently controlled on the following medications: memantine 10 mg bid  We discussed: Patient's son lives with her. She cooks and washes clothes and he manages yard work. Patient reports that she can still drive but her son is driving for her. Patient thinks it is good he lives with her because helps her keep on schedule.  Plan  Continue current medications  Health Maintenance   Patient is currently controlled on the following medications:  Sodium chloride tablet - general health  We discussed:  Patient is pleased with current health. Recommended continuing medications as prescribed.   Plan  Continue current medications Vaccines   Reviewed and discussed patient's vaccination history.    Immunization History  Administered Date(s) Administered  . Influenza,inj,quad, With Preservative 02/09/2018  . Influenza-Unspecified 02/14/2018   Plan  Recommended patient receive flu vaccine in office..   Medication Management   Pt uses [facility] for all medications Uses weekly pill box Pt endorses 100% compliance  We discussed: Patient can't  find her thyroid medications. Called pharmacy and 90 day supply was picked up on 08/09/2019. Her son will help her looks when he gets home. Pharmacist will follow-up tomorrow am with patient.   Plan  Recommend patient consider packaging service for medications to improve adherence.    Follow up: 1 month

## 2019-09-04 ENCOUNTER — Ambulatory Visit: Payer: PPO

## 2019-09-04 ENCOUNTER — Other Ambulatory Visit: Payer: Self-pay

## 2019-09-04 DIAGNOSIS — E782 Mixed hyperlipidemia: Secondary | ICD-10-CM

## 2019-09-04 DIAGNOSIS — I10 Essential (primary) hypertension: Secondary | ICD-10-CM

## 2019-09-04 NOTE — Patient Instructions (Addendum)
Visit Information  Thank you for your time discussing your medications. I look forward to working with you to achieve your health care goals. Below is a summary of what we talked about during our visit.   Goals Addressed            This Visit's Progress   . Pharmacy Care Plan HLD       CARE PLAN ENTRY (see longitudinal plan of care for additional care plan information)  Current Barriers:  . Uncontrolled hyperlipidemia, complicated by age and HTN . Current antihyperlipidemic regimen: atorvastatin . Previous antihyperlipidemic medications tried none noted . Most recent lipid panel:     Component Value Date/Time   CHOL 113 07/02/2019 1107   TRIG 92 07/02/2019 1107   HDL 28 (L) 07/02/2019 1107   CHOLHDL 4.0 07/02/2019 1107   CHOLHDL 3.3 03/09/2017 0226   VLDL 10 03/09/2017 0226   LDLCALC 67 07/02/2019 1107   . ASCVD risk enhancing conditions: age >25, DM, HTN, CKD, CHF, current smoker   Pharmacist Clinical Goal(s):  Marland Kitchen Over the next 30 days, patient will work with PharmD and providers towards optimized antihyperlipidemic therapy  Interventions: . Comprehensive medication review performed; medication list updated in electronic medical record.  Bertram Savin care team collaboration (see longitudinal plan of care) . Ensure safety, efficacy, and affordability of medications   Patient Self Care Activities:  . Patient will focus on medication adherence by continuing to use weekly medication organizer. Pharmacist will follow-up in 30 days.  . Patient will continue to eat a heart healthy meal of lean protein and vegetables daily. Pharmacist will follow-up in 30 days.   Initial goal documentation     . Pharmacy Care Plan HTN       CARE PLAN ENTRY (see longitudinal plan of care for additional care plan information)  Current Barriers:  . Uncontrolled hypertension, complicated by age . Current antihypertensive regimen: diltiazem, hydralazine, carvedilol . Previous  antihypertensives tried: n/a . Last practice recorded BP readings:  BP Readings from Last 3 Encounters:  07/02/19 (!) 144/70  06/06/19 (!) 142/72  06/22/18 138/70 .   Marland Kitchen Current home BP readings: 144-151/63-69 mmHg . Most recent eGFR/CrCl: No results found for: EGFR  No components found for: CRCL  Pharmacist Clinical Goal(s):  Marland Kitchen Over the next 30 days, patient will work with PharmD and providers to optimize antihypertensive regimen . Ensure safety, efficacy, and affordability of medications   Interventions: . Inter-disciplinary care team collaboration (see longitudinal plan of care) . Comprehensive medication review performed; medication list updated in the electronic medical record.    Patient Self Care Activities:  . Patient will continue to check BP weekly , document, and provide at future appointments . Patient will focus on medication adherence by continuing to use weekly pill orgainzer for optimal adherence.  Initial goal documentation        Sherry Hess was given information about Chronic Care Management services today including:  1. CCM service includes personalized support from designated clinical staff supervised by her physician, including individualized plan of care and coordination with other care providers 2. 24/7 contact phone numbers for assistance for urgent and routine care needs. 3. Standard insurance, coinsurance, copays and deductibles apply for chronic care management only during months in which we provide at least 20 minutes of these services. Most insurances cover these services at 100%, however patients may be responsible for any copay, coinsurance and/or deductible if applicable. This service may help you avoid the need for more expensive  face-to-face services. 4. Only one practitioner may furnish and bill the service in a calendar month. 5. The patient may stop CCM services at any time (effective at the end of the month) by phone call to the office  staff.  Patient agreed to services and verbal consent obtained.   Print copy of patient instructions provided.  Telephone follow up appointment with pharmacy team member scheduled for: 10/05/2019  Sherry Hess, PharmD Clinical Pharmacist [facility] [phone]   DASH Eating Plan DASH stands for "Dietary Approaches to Stop Hypertension." The DASH eating plan is a healthy eating plan that has been shown to reduce high blood pressure (hypertension). It may also reduce your risk for type 2 diabetes, heart disease, and stroke. The DASH eating plan may also help with weight loss. What are tips for following this plan?  General guidelines  Avoid eating more than 2,300 mg (milligrams) of salt (sodium) a day. If you have hypertension, you may need to reduce your sodium intake to 1,500 mg a day.  Limit alcohol intake to no more than 1 drink a day for nonpregnant women and 2 drinks a day for men. One drink equals 12 oz of beer, 5 oz of wine, or 1 oz of hard liquor.  Work with your health care provider to maintain a healthy body weight or to lose weight. Ask what an ideal weight is for you.  Get at least 30 minutes of exercise that causes your heart to beat faster (aerobic exercise) most days of the week. Activities may include walking, swimming, or biking.  Work with your health care provider or diet and nutrition specialist (dietitian) to adjust your eating plan to your individual calorie needs. Reading food labels   Check food labels for the amount of sodium per serving. Choose foods with less than 5 percent of the Daily Value of sodium. Generally, foods with less than 300 mg of sodium per serving fit into this eating plan.  To find whole grains, look for the word "whole" as the first word in the ingredient list. Shopping  Buy products labeled as "low-sodium" or "no salt added."  Buy fresh foods. Avoid canned foods and premade or frozen meals. Cooking  Avoid adding salt  when cooking. Use salt-free seasonings or herbs instead of table salt or sea salt. Check with your health care provider or pharmacist before using salt substitutes.  Do not fry foods. Cook foods using healthy methods such as baking, boiling, grilling, and broiling instead.  Cook with heart-healthy oils, such as olive, canola, soybean, or sunflower oil. Meal planning  Eat a balanced diet that includes: ? 5 or more servings of fruits and vegetables each day. At each meal, try to fill half of your plate with fruits and vegetables. ? Up to 6-8 servings of whole grains each day. ? Less than 6 oz of lean meat, poultry, or fish each day. A 3-oz serving of meat is about the same size as a deck of cards. One egg equals 1 oz. ? 2 servings of low-fat dairy each day. ? A serving of nuts, seeds, or beans 5 times each week. ? Heart-healthy fats. Healthy fats called Omega-3 fatty acids are found in foods such as flaxseeds and coldwater fish, like sardines, salmon, and mackerel.  Limit how much you eat of the following: ? Canned or prepackaged foods. ? Food that is high in trans fat, such as fried foods. ? Food that is high in saturated fat, such as fatty meat. ?  Sweets, desserts, sugary drinks, and other foods with added sugar. ? Full-fat dairy products.  Do not salt foods before eating.  Try to eat at least 2 vegetarian meals each week.  Eat more home-cooked food and less restaurant, buffet, and fast food.  When eating at a restaurant, ask that your food be prepared with less salt or no salt, if possible. What foods are recommended? The items listed may not be a complete list. Talk with your dietitian about what dietary choices are best for you. Grains Whole-grain or whole-wheat bread. Whole-grain or whole-wheat pasta. Beckhem Isadore rice. [place] Morrow. Bulgur. Whole-grain and low-sodium cereals. Pita bread. Low-fat, low-sodium crackers. Whole-wheat flour tortillas. Vegetables Fresh or frozen  vegetables (raw, steamed, roasted, or grilled). Low-sodium or reduced-sodium tomato and vegetable juice. Low-sodium or reduced-sodium tomato sauce and tomato paste. Low-sodium or reduced-sodium canned vegetables. Fruits All fresh, dried, or frozen fruit. Canned fruit in natural juice (without added sugar). Meat and other protein foods Skinless chicken or [place]. Ground chicken or [place]. Pork with fat trimmed off. Fish and seafood. Egg whites. Dried beans, peas, or lentils. Unsalted nuts, nut butters, and seeds. Unsalted canned beans. Lean cuts of beef with fat trimmed off. Low-sodium, lean deli meat. Dairy Low-fat (1%) or fat-free (skim) milk. Fat-free, low-fat, or reduced-fat cheeses. Nonfat, low-sodium ricotta or cottage cheese. Low-fat or nonfat yogurt. Low-fat, low-sodium cheese. Fats and oils Soft margarine without trans fats. Vegetable oil. Low-fat, reduced-fat, or light mayonnaise and salad dressings (reduced-sodium). Canola, safflower, olive, soybean, and sunflower oils. Avocado. Seasoning and other foods Herbs. Spices. Seasoning mixes without salt. Unsalted popcorn and pretzels. Fat-free sweets. What foods are not recommended? The items listed may not be a complete list. Talk with your dietitian about what dietary choices are best for you. Grains Baked goods made with fat, such as croissants, muffins, or some breads. Dry pasta or rice meal packs. Vegetables Creamed or fried vegetables. Vegetables in a cheese sauce. Regular canned vegetables (not low-sodium or reduced-sodium). Regular canned tomato sauce and paste (not low-sodium or reduced-sodium). Regular tomato and vegetable juice (not low-sodium or reduced-sodium). Angie Fava. Olives. Fruits Canned fruit in a light or heavy syrup. Fried fruit. Fruit in cream or butter sauce. Meat and other protein foods Fatty cuts of meat. Ribs. Fried meat. Berniece Salines. Sausage. Bologna and other processed lunch meats. Salami. Fatback. Hotdogs. Bratwurst.  Salted nuts and seeds. Canned beans with added salt. Canned or smoked fish. Whole eggs or egg yolks. Chicken or [place] with skin. Dairy Whole or 2% milk, cream, and half-and-half. Whole or full-fat cream cheese. Whole-fat or sweetened yogurt. Full-fat cheese. Nondairy creamers. Whipped toppings. Processed cheese and cheese spreads. Fats and oils Butter. Stick margarine. Lard. Shortening. Ghee. Bacon fat. Tropical oils, such as coconut, palm kernel, or palm oil. Seasoning and other foods Salted popcorn and pretzels. Onion salt, garlic salt, seasoned salt, table salt, and sea salt. Worcestershire sauce. Tartar sauce. Barbecue sauce. Teriyaki sauce. Soy sauce, including reduced-sodium. Steak sauce. Canned and packaged gravies. Fish sauce. Oyster sauce. Cocktail sauce. Horseradish that you find on the shelf. Ketchup. Mustard. Meat flavorings and tenderizers. Bouillon cubes. Hot sauce and Tabasco sauce. Premade or packaged marinades. Premade or packaged taco seasonings. Relishes. Regular salad dressings. Where to find more information:  National Heart, Lung, and [place]: https://wilson-eaton.com/  American Heart Association: www.heart.org Summary  The DASH eating plan is a healthy eating plan that has been shown to reduce high blood pressure (hypertension). It may also reduce your risk for type 2 diabetes,  heart disease, and stroke.  With the DASH eating plan, you should limit salt (sodium) intake to 2,300 mg a day. If you have hypertension, you may need to reduce your sodium intake to 1,500 mg a day.  When on the DASH eating plan, aim to eat more fresh fruits and vegetables, whole grains, lean proteins, low-fat dairy, and heart-healthy fats.  Work with your health care provider or diet and nutrition specialist (dietitian) to adjust your eating plan to your individual calorie needs. This information is not intended to replace advice given to you by your health care provider. Make sure you discuss any  questions you have with your health care provider. Document Revised: 04/15/2017 Document Reviewed: 04/26/2016 Elsevier Patient Education  2020 [employer].

## 2019-09-05 NOTE — Progress Notes (Signed)
Richarda Overlie, MD, have reviewed all documentation for this visit. The documentation on 09/05/19 for the exam, diagnosis, procedures, and orders are all accurate and complete.  I have collaborated with the care management provider regarding care management and care coordination activities outlined in this encounter and have reviewed this encounter including documentation in the note and care plan. I am certifying that I agree with the content of this note and encounter as supervising physician.

## 2019-09-06 ENCOUNTER — Other Ambulatory Visit: Payer: Self-pay

## 2019-09-06 DIAGNOSIS — N184 Chronic kidney disease, stage 4 (severe): Secondary | ICD-10-CM

## 2019-09-06 DIAGNOSIS — I10 Essential (primary) hypertension: Secondary | ICD-10-CM

## 2019-09-06 DIAGNOSIS — E782 Mixed hyperlipidemia: Secondary | ICD-10-CM

## 2019-09-06 NOTE — Progress Notes (Signed)
Patient had an appointment on 09/04/2019 with Sherre Poot, PharmD.

## 2019-09-26 NOTE — Progress Notes (Signed)
Subjective:  Patient ID: Sherry Hess, female    DOB: 04/30/1931  Age: 86 y.o. MRN: [id number]  Chief Complaint  Patient presents with  . Hyperlipidemia  . Hypertension    HPI  Patient presents for follow up of hypertension, hyperlipidemia, CKD, atrial fibrillation, and stroke.  Patient is eating healthy. No exercise. Meds reviewed and compliant with taking medicines. Patient has mild dementia, but lives wih her son. She has two sons and both help to take care of their mother. She has no complaints today.  Past Medical History:  Diagnosis Date  . Anxiety   . Benign neoplasm of rectum   . Benign neoplasm of sigmoid colon   . Chest pain in adult 12/27/2015  . CVA (cerebral vascular accident) ([place]) 03/08/2017  . Dyspepsia 03/09/2017  . Essential hypertension 11/20/2014  . Fatigue 12/10/2016  . High cholesterol   . HLD (hyperlipidemia) 03/09/2017  . Hx of transient ischemic attack (TIA) 10/18/2016  . Hypertension   . Normocytic anemia   . Occult blood positive stool 03/09/2017  . Paroxysmal atrial fibrillation ([place]) 02/23/2017  . Syncope 12/10/2016  . TIA (transient ischemic attack)    Past Surgical History:  Procedure Laterality Date  . COLONOSCOPY N/A 03/11/2017   Procedure: COLONOSCOPY;  Surgeon: Gatha Mayer, MD;  Location: [facility] ENDOSCOPY;  Service: Endoscopy;  Laterality: N/A;  . ESOPHAGOGASTRODUODENOSCOPY N/A 03/11/2017   Procedure: ESOPHAGOGASTRODUODENOSCOPY (EGD);  Surgeon: Gatha Mayer, MD;  Location: [facility] ENDOSCOPY;  Service: Endoscopy;  Laterality: N/A;  . TONSILLECTOMY    . VESICOVAGINAL FISTULA CLOSURE W/ TAH      Family History  Problem Relation Age of Onset  . Cancer Father        lung  . Stroke Mother   . Cancer Brother        x2 bladder  . Ataxia Neg Hx   . Chorea Neg Hx   . Dementia Neg Hx   . Mental retardation Neg Hx   . Migraines Neg Hx   . Multiple sclerosis Neg Hx   . Neurofibromatosis Neg Hx   . Neuropathy Neg Hx   . Parkinsonism Neg Hx    . Seizures Neg Hx    Social History   Socioeconomic History  . Marital status: Widowed    Spouse name: Not on file  . Number of children: Not on file  . Years of education: Not on file  . Highest education level: Not on file  Occupational History  . Not on file  Tobacco Use  . Smoking status: Never Smoker  . Smokeless tobacco: Never Used  Substance and Sexual Activity  . Alcohol use: No  . Drug use: No  . Sexual activity: Not on file  Other Topics Concern  . Not on file  Social History Narrative  . Not on file   Social Determinants of Health   Financial Resource Strain:   . Difficulty of Paying Living Expenses:   Food Insecurity: No Food Insecurity  . Worried About Charity fundraiser in the Last Year: Never true  . Ran Out of Food in the Last Year: Never true  Transportation Needs: No Transportation Needs  . Lack of Transportation (Medical): No  . Lack of Transportation (Non-Medical): No  Physical Activity:   . Days of Exercise per Week:   . Minutes of Exercise per Session:   Stress:   . Feeling of Stress :   Social Connections:   . Frequency of Communication with Friends and Family:   .  Frequency of Social Gatherings with Friends and Family:   . Attends Religious Services:   . Active Member of Clubs or Organizations:   . Attends Archivist Meetings:   Marland Kitchen Marital Status:     Review of Systems  Constitutional: Negative for chills, fatigue and fever.  HENT: Negative for congestion, ear pain, rhinorrhea and sore throat.   Respiratory: Negative for cough and shortness of breath.   Cardiovascular: Negative for chest pain.  Gastrointestinal: Negative for abdominal pain, constipation, diarrhea, nausea and vomiting.  Genitourinary: Negative for dysuria and urgency.  Musculoskeletal: Negative for back pain and myalgias.  Neurological: Negative for dizziness, weakness, light-headedness and headaches.  Psychiatric/Behavioral: Negative for dysphoric mood. The  patient is not nervous/anxious.      Objective:  BP 128/68   Pulse 61   Temp (!) 97.5 F (36.4 C)   Ht 5' 3.5" (1.613 m)   Wt 145 lb (65.8 kg)   SpO2 95%   BMI 25.28 kg/m   BP/Weight 10/01/2019 07/02/2019 08/23/1446  Systolic BP 185 631 497  Diastolic BP 68 70 72  Wt. (Lbs) 145 144.4 145  BMI 25.28 26.41 25.28    Physical Exam Vitals reviewed.  Constitutional:      Appearance: Normal appearance. [name] normal weight.  Cardiovascular:     Rate and Rhythm: Normal rate and regular rhythm.     Pulses: Normal pulses.     Heart sounds: Normal heart sounds.  Pulmonary:     Effort: Pulmonary effort is normal. No respiratory distress.     Breath sounds: Normal breath sounds.  Abdominal:     General: Abdomen is flat. Bowel sounds are normal.     Palpations: Abdomen is soft.     Tenderness: There is no abdominal tenderness.  Neurological:     Mental Status: [name] alert and oriented to person, place, and time.  Psychiatric:        Mood and Affect: Mood normal.        Behavior: Behavior normal.     Diabetic Foot Exam - Simple   No data filed       Lab Results  Component Value Date   WBC 3.2 (L) 10/01/2019   HGB 14.5 10/01/2019   HCT 44.7 10/01/2019   PLT 181 10/01/2019   GLUCOSE 85 10/01/2019   CHOL 113 10/01/2019   TRIG 92 10/01/2019   HDL 31 (L) 10/01/2019   LDLCALC 64 10/01/2019   ALT 7 10/01/2019   AST 23 10/01/2019   NA 141 10/01/2019   K 4.0 10/01/2019   CL 100 10/01/2019   CREATININE 1.82 (H) 10/01/2019   BUN 24 10/01/2019   CO2 28 10/01/2019   TSH 2.596 03/10/2017   INR 1.1 01/26/2017   HGBA1C 5.2 03/09/2017      Assessment & Plan:  1. Hypertensive kidney disease with chronic kidney disease stage IV ([place]) Well controlled.  No changes to medicines.  Continue to work on eating a healthy diet and exercise.  Labs drawn today.  - CBC with Differential/Platelet - Comprehensive metabolic panel  2. Mixed hyperlipidemia Well controlled.  No  changes to medicines.  Continue to work on eating a healthy diet and exercise.  Labs drawn today.  - Lipid panel  3. CKD (chronic kidney disease) stage 4, GFR 15-29 ml/min (HCC) Continue to follow up with nephrology.  4. Chronic atrial fibrillation (HCC) The current medical regimen is effective;  continue present plan and medications.  5. Vascular dementia without behavioral  disturbance (HCC) The current medical regimen is effective;  continue present plan and medications.  Orders Placed This Encounter  Procedures  . CBC with Differential/Platelet  . Comprehensive metabolic panel  . Lipid panel  . CBC with Differential/Platelet  . Comprehensive metabolic panel  . Lipid panel  . Cardiovascular Risk Assessment     Follow-up: Return for Dr. Tobie Poet, AWV with Maudie Mercury, LPN in early September 2021.Marland Kitchen  An After Visit Summary was printed and given to the patient.  [name] [facility] [phone]

## 2019-10-01 ENCOUNTER — Other Ambulatory Visit: Payer: Self-pay | Admitting: Family Medicine

## 2019-10-01 ENCOUNTER — Encounter: Payer: Self-pay | Admitting: Family Medicine

## 2019-10-01 ENCOUNTER — Ambulatory Visit (INDEPENDENT_AMBULATORY_CARE_PROVIDER_SITE_OTHER): Payer: PPO | Admitting: Family Medicine

## 2019-10-01 ENCOUNTER — Other Ambulatory Visit: Payer: Self-pay

## 2019-10-01 VITALS — BP 128/68 | HR 61 | Temp 97.5°F | Ht 63.5 in | Wt 145.0 lb

## 2019-10-01 DIAGNOSIS — I10 Essential (primary) hypertension: Secondary | ICD-10-CM | POA: Diagnosis not present

## 2019-10-01 DIAGNOSIS — I129 Hypertensive chronic kidney disease with stage 1 through stage 4 chronic kidney disease, or unspecified chronic kidney disease: Secondary | ICD-10-CM

## 2019-10-01 DIAGNOSIS — F0150 Vascular dementia without behavioral disturbance: Secondary | ICD-10-CM | POA: Diagnosis not present

## 2019-10-01 DIAGNOSIS — N184 Chronic kidney disease, stage 4 (severe): Secondary | ICD-10-CM | POA: Diagnosis not present

## 2019-10-01 DIAGNOSIS — I482 Chronic atrial fibrillation, unspecified: Secondary | ICD-10-CM | POA: Diagnosis not present

## 2019-10-01 DIAGNOSIS — E782 Mixed hyperlipidemia: Secondary | ICD-10-CM

## 2019-10-02 LAB — LIPID PANEL
Chol/HDL Ratio: 3.6 ratio (ref 0.0–4.4)
Cholesterol, Total: 113 mg/dL (ref 100–199)
HDL: 31 mg/dL — ABNORMAL LOW (ref >39)
LDL Chol Calc (NIH): 64 mg/dL (ref 0–99)
Triglycerides: 92 mg/dL (ref 0–149)
VLDL Cholesterol Cal: 18 mg/dL (ref 5–40)

## 2019-10-02 LAB — CBC WITH DIFFERENTIAL/PLATELET
Basophils Absolute: 0 10*3/uL (ref 0.0–0.2)
Basos: 1 %
EOS (ABSOLUTE): 0 10*3/uL (ref 0.0–0.4)
Eos: 0 %
Hematocrit: 44.7 % (ref 34.0–46.6)
Hemoglobin: 14.5 g/dL (ref 11.1–15.9)
Immature Grans (Abs): 0 10*3/uL (ref 0.0–0.1)
Immature Granulocytes: 0 %
Lymphocytes Absolute: 0.8 10*3/uL (ref 0.7–3.1)
Lymphs: 26 %
MCH: 29.3 pg (ref 26.6–33.0)
MCHC: 32.4 g/dL (ref 31.5–35.7)
MCV: 90 fL (ref 79–97)
Monocytes Absolute: 0.4 10*3/uL (ref 0.1–0.9)
Monocytes: 13 %
Neutrophils Absolute: 1.9 10*3/uL (ref 1.4–7.0)
Neutrophils: 60 %
Platelets: 181 10*3/uL (ref 150–450)
RBC: 4.95 x10E6/uL (ref 3.77–5.28)
RDW: 14.6 % (ref 11.7–15.4)
WBC: 3.2 10*3/uL — ABNORMAL LOW (ref 3.4–10.8)

## 2019-10-02 LAB — COMPREHENSIVE METABOLIC PANEL
ALT: 7 IU/L (ref 0–32)
AST: 23 IU/L (ref 0–40)
Albumin/Globulin Ratio: 1.6 (ref 1.2–2.2)
Albumin: 4.2 g/dL (ref 3.6–4.6)
Alkaline Phosphatase: 74 IU/L (ref 48–121)
BUN/Creatinine Ratio: 13 (ref 12–28)
BUN: 24 mg/dL (ref 8–27)
Bilirubin Total: 0.5 mg/dL (ref 0.0–1.2)
CO2: 28 mmol/L (ref 20–29)
Calcium: 9.3 mg/dL (ref 8.7–10.3)
Chloride: 100 mmol/L (ref 96–106)
Creatinine, Ser: 1.82 mg/dL — ABNORMAL HIGH (ref 0.57–1.00)
GFR calc Af Amer: 29 mL/min/{1.73_m2} — ABNORMAL LOW (ref >59)
GFR calc non Af Amer: 25 mL/min/{1.73_m2} — ABNORMAL LOW (ref >59)
Globulin, Total: 2.7 g/dL (ref 1.5–4.5)
Glucose: 85 mg/dL (ref 65–99)
Potassium: 4 mmol/L (ref 3.5–5.2)
Sodium: 141 mmol/L (ref 134–144)
Total Protein: 6.9 g/dL (ref 6.0–8.5)

## 2019-10-02 LAB — CARDIOVASCULAR RISK ASSESSMENT

## 2019-10-05 ENCOUNTER — Ambulatory Visit: Payer: PPO

## 2019-10-05 DIAGNOSIS — I10 Essential (primary) hypertension: Secondary | ICD-10-CM

## 2019-10-05 DIAGNOSIS — E782 Mixed hyperlipidemia: Secondary | ICD-10-CM

## 2019-10-05 NOTE — Patient Instructions (Addendum)
Visit Information  Goals Addressed            This Visit's Progress   . Pharmacy Care Plan HLD       CARE PLAN ENTRY (see longitudinal plan of care for additional care plan information)  Current Barriers:  . Uncontrolled hyperlipidemia, complicated by age and HTN . Current antihyperlipidemic regimen: atorvastatin . Previous antihyperlipidemic medications tried none noted . Most recent lipid panel:     Component Value Date/Time   CHOL 113 10/01/2019 1156   TRIG 92 10/01/2019 1156   HDL 31 (L) 10/01/2019 1156   CHOLHDL 3.6 10/01/2019 1156   CHOLHDL 3.3 03/09/2017 0226   VLDL 10 03/09/2017 0226   LDLCALC 64 10/01/2019 1156   . ASCVD risk enhancing conditions: age >51, DM, HTN, CKD, CHF, current smoker   Pharmacist Clinical Goal(s):  Marland Kitchen Over the next 30 days, patient will work with PharmD and providers towards optimized antihyperlipidemic therapy  Interventions: . Comprehensive medication review performed; medication list updated in electronic medical record.  Bertram Savin care team collaboration (see longitudinal plan of care) . Ensure safety, efficacy, and affordability of medications   Patient Self Care Activities:  . Patient will focus on medication adherence by continuing to use weekly medication organizer. Pharmacist will follow-up in 30 days.  . Patient will continue to eat a heart healthy meal of lean protein and vegetables daily. Pharmacist will follow-up in 30 days.   Please see past updates related to this goal by clicking on the "Past Updates" button in the selected goal      . Pharmacy Care Plan HTN       CARE PLAN ENTRY (see longitudinal plan of care for additional care plan information)  Current Barriers:  . Uncontrolled hypertension, complicated by age . Current antihypertensive regimen: diltiazem, hydralazine, carvedilol . Previous antihypertensives tried: n/a . Last practice recorded BP readings:  BP Readings from Last 3 Encounters:    10/01/19 128/68  07/02/19 (!) 144/70  06/06/19 (!) 142/72 .   Marland Kitchen Current home BP readings: 144-151/63-69 mmHg . Most recent eGFR/CrCl: No results found for: EGFR  No components found for: CRCL  Pharmacist Clinical Goal(s):  Marland Kitchen Over the next 30 days, patient will work with PharmD and providers to optimize antihypertensive regimen . Ensure safety, efficacy, and affordability of medications  Interventions: . Inter-disciplinary care team collaboration (see longitudinal plan of care) . Comprehensive medication review performed; medication list updated in the electronic medical record.   Patient Self Care Activities:  . Patient will continue to check BP weekly , document, and provide at future appointments . Patient will focus on medication adherence by continuing to use weekly pill orgainzer for optimal adherence.  Please see past updates related to this goal by clicking on the "Past Updates" button in the selected goal         The patient verbalized understanding of instructions provided today and declined a print copy of patient instruction materials.   Telephone follow up appointment with pharmacy team member scheduled for: 12/2019  Sherre Poot, PharmD, [facility] Clinical Pharmacist [facility] [phone] (office) [phone] (mobile)    DASH Eating Plan DASH stands for "Dietary Approaches to Stop Hypertension." The DASH eating plan is a healthy eating plan that has been shown to reduce high blood pressure (hypertension). It may also reduce your risk for type 2 diabetes, heart disease, and stroke. The DASH eating plan may also help with weight loss. What are tips for following this plan?  General guidelines  Avoid eating more than 2,300 mg (milligrams) of salt (sodium) a day. If you have hypertension, you may need to reduce your sodium intake to 1,500 mg a day.  Limit alcohol intake to no more than 1 drink a day for nonpregnant women and 2 drinks a day for men.  One drink equals 12 oz of beer, 5 oz of wine, or 1 oz of hard liquor.  Work with your health care provider to maintain a healthy body weight or to lose weight. Ask what an ideal weight is for you.  Get at least 30 minutes of exercise that causes your heart to beat faster (aerobic exercise) most days of the week. Activities may include walking, swimming, or biking.  Work with your health care provider or diet and nutrition specialist (dietitian) to adjust your eating plan to your individual calorie needs. Reading food labels   Check food labels for the amount of sodium per serving. Choose foods with less than 5 percent of the Daily Value of sodium. Generally, foods with less than 300 mg of sodium per serving fit into this eating plan.  To find whole grains, look for the word "whole" as the first word in the ingredient list. Shopping  Buy products labeled as "low-sodium" or "no salt added."  Buy fresh foods. Avoid canned foods and premade or frozen meals. Cooking  Avoid adding salt when cooking. Use salt-free seasonings or herbs instead of table salt or sea salt. Check with your health care provider or pharmacist before using salt substitutes.  Do not fry foods. Cook foods using healthy methods such as baking, boiling, grilling, and broiling instead.  Cook with heart-healthy oils, such as olive, canola, soybean, or sunflower oil. Meal planning  Eat a balanced diet that includes: ? 5 or more servings of fruits and vegetables each day. At each meal, try to fill half of your plate with fruits and vegetables. ? Up to 6-8 servings of whole grains each day. ? Less than 6 oz of lean meat, poultry, or fish each day. A 3-oz serving of meat is about the same size as a deck of cards. One egg equals 1 oz. ? 2 servings of low-fat dairy each day. ? A serving of nuts, seeds, or beans 5 times each week. ? Heart-healthy fats. Healthy fats called Omega-3 fatty acids are found in foods such as flaxseeds  and coldwater fish, like sardines, salmon, and mackerel.  Limit how much you eat of the following: ? Canned or prepackaged foods. ? Food that is high in trans fat, such as fried foods. ? Food that is high in saturated fat, such as fatty meat. ? Sweets, desserts, sugary drinks, and other foods with added sugar. ? Full-fat dairy products.  Do not salt foods before eating.  Try to eat at least 2 vegetarian meals each week.  Eat more home-cooked food and less restaurant, buffet, and fast food.  When eating at a restaurant, ask that your food be prepared with less salt or no salt, if possible. What foods are recommended? The items listed may not be a complete list. Talk with your dietitian about what dietary choices are best for you. Grains Whole-grain or whole-wheat bread. Whole-grain or whole-wheat pasta. Anibal Quinby rice. Modena Morrow. Bulgur. Whole-grain and low-sodium cereals. Pita bread. Low-fat, low-sodium crackers. Whole-wheat flour tortillas. Vegetables Fresh or frozen vegetables (raw, steamed, roasted, or grilled). Low-sodium or reduced-sodium tomato and vegetable juice. Low-sodium or reduced-sodium tomato sauce and tomato paste. Low-sodium or reduced-sodium  canned vegetables. Fruits All fresh, dried, or frozen fruit. Canned fruit in natural juice (without added sugar). Meat and other protein foods Skinless chicken or [place]. Ground chicken or [place]. Pork with fat trimmed off. Fish and seafood. Egg whites. Dried beans, peas, or lentils. Unsalted nuts, nut butters, and seeds. Unsalted canned beans. Lean cuts of beef with fat trimmed off. Low-sodium, lean deli meat. Dairy Low-fat (1%) or fat-free (skim) milk. Fat-free, low-fat, or reduced-fat cheeses. Nonfat, low-sodium ricotta or cottage cheese. Low-fat or nonfat yogurt. Low-fat, low-sodium cheese. Fats and oils Soft margarine without trans fats. Vegetable oil. Low-fat, reduced-fat, or light mayonnaise and salad dressings  (reduced-sodium). Canola, safflower, olive, soybean, and sunflower oils. Avocado. Seasoning and other foods Herbs. Spices. Seasoning mixes without salt. Unsalted popcorn and pretzels. Fat-free sweets. What foods are not recommended? The items listed may not be a complete list. Talk with your dietitian about what dietary choices are best for you. Grains Baked goods made with fat, such as croissants, muffins, or some breads. Dry pasta or rice meal packs. Vegetables Creamed or fried vegetables. Vegetables in a cheese sauce. Regular canned vegetables (not low-sodium or reduced-sodium). Regular canned tomato sauce and paste (not low-sodium or reduced-sodium). Regular tomato and vegetable juice (not low-sodium or reduced-sodium). Angie Fava. Olives. Fruits Canned fruit in a light or heavy syrup. Fried fruit. Fruit in cream or butter sauce. Meat and other protein foods Fatty cuts of meat. Ribs. Fried meat. Berniece Salines. Sausage. Bologna and other processed lunch meats. Salami. Fatback. Hotdogs. Bratwurst. Salted nuts and seeds. Canned beans with added salt. Canned or smoked fish. Whole eggs or egg yolks. Chicken or [place] with skin. Dairy Whole or 2% milk, cream, and half-and-half. Whole or full-fat cream cheese. Whole-fat or sweetened yogurt. Full-fat cheese. Nondairy creamers. Whipped toppings. Processed cheese and cheese spreads. Fats and oils Butter. Stick margarine. Lard. Shortening. Ghee. Bacon fat. Tropical oils, such as coconut, palm kernel, or palm oil. Seasoning and other foods Salted popcorn and pretzels. Onion salt, garlic salt, seasoned salt, table salt, and sea salt. Worcestershire sauce. Tartar sauce. Barbecue sauce. Teriyaki sauce. Soy sauce, including reduced-sodium. Steak sauce. Canned and packaged gravies. Fish sauce. Oyster sauce. Cocktail sauce. Horseradish that you find on the shelf. Ketchup. Mustard. Meat flavorings and tenderizers. Bouillon cubes. Hot sauce and Tabasco sauce. Premade or  packaged marinades. Premade or packaged taco seasonings. Relishes. Regular salad dressings. Where to find more information:  National Heart, Lung, and [place]: https://wilson-eaton.com/  American Heart Association: www.heart.org Summary  The DASH eating plan is a healthy eating plan that has been shown to reduce high blood pressure (hypertension). It may also reduce your risk for type 2 diabetes, heart disease, and stroke.  With the DASH eating plan, you should limit salt (sodium) intake to 2,300 mg a day. If you have hypertension, you may need to reduce your sodium intake to 1,500 mg a day.  When on the DASH eating plan, aim to eat more fresh fruits and vegetables, whole grains, lean proteins, low-fat dairy, and heart-healthy fats.  Work with your health care provider or diet and nutrition specialist (dietitian) to adjust your eating plan to your individual calorie needs. This information is not intended to replace advice given to you by your health care provider. Make sure you discuss any questions you have with your health care provider. Document Revised: 04/15/2017 Document Reviewed: 04/26/2016 Elsevier Patient Education  2020 [employer].

## 2019-10-05 NOTE — Chronic Care Management (AMB) (Signed)
Chronic Care Management Pharmacy  Name: Sherry Hess  MRN: [id number] DOB: 03-25-1931  Chief Complaint/ HPI  Sherry Hess,  86 y.o. , female presents for their Follow-Up CCM visit with the clinical pharmacist via telephone due to COVID-19 Pandemic.  PCP : [name], MD  Their chronic conditions include: Hypertension, Afib, CKD, Vascular Dementia, Dyspepsia, HLD, Anemia.   Office Visits: 10/01/2019 - Chronic visit with Dr. Tobie Poet - no med changes. 07/02/2019 - Office visit with labs drawn and refilled hydralazine.   Consult Visit: 06/06/2019 - Cardio visit with Dr. Raliegh Ip. No medicine changes. 06/01/2019 - Dr. Hollie Salk for CKD.  05/03/2019 - Dr. Bobby Rumpf for iron deficiency anemia.   Medications: Outpatient Encounter Medications as of 10/05/2019  Medication Sig  . atorvastatin (LIPITOR) 20 MG tablet TAKE 1 TABLET BY MOUTH DAILY  . Calcium Citrate (CAL-CITRATE PO) Take by mouth daily.  . carvedilol (COREG) 12.5 MG tablet TAKE 1 TABLET BY MOUTH 2 TIMES DAILY WITH FOOD  . diltiazem (CARDIZEM CD) 180 MG 24 hr capsule TAKE 1 CAPSULE BY MOUTH DAILY  . ELIQUIS 2.5 MG TABS tablet TAKE 1 TABLET BY MOUTH TWICE DAILY  . Ferrous Sulfate (CVS SLOW RELEASE IRON PO) Take 1 tablet daily by mouth.  . Fish Oil-Cholecalciferol (FISH OIL + D3) 1000-1000 MG-UNIT CAPS Take by mouth.  Marland Kitchen FLUoxetine (PROZAC) 10 MG capsule TAKE 1 CAPSULE BY MOUTH ONCE DAILY  . hydrALAZINE (APRESOLINE) 50 MG tablet TAKE 1 TABLET BY MOUTH 2 TIMES DAILY  . levothyroxine (SYNTHROID) 25 MCG tablet Take 25 mcg by mouth daily.   . memantine (NAMENDA) 10 MG tablet TAKE 1 TABLET BY MOUTH 2 TIMES DAILY  . Multiple Vitamin (MULTIVITAMIN) tablet Take 1 tablet by mouth daily.  Marland Kitchen omeprazole (PRILOSEC) 20 MG capsule Take 20 mg by mouth daily.   . sodium chloride 1 g tablet Take 1 g by mouth daily.  Marland Kitchen FLUoxetine (PROZAC) 10 MG tablet Take 10 mg by mouth daily.   No facility-administered encounter medications on file as of  10/05/2019.   Allergies  Allergen Reactions  . Minoxidil     rash  . Norvasc [Amlodipine Besylate]     rash  . Clonidine Derivatives Rash    [name]   SDOH Screenings   Alcohol Screen:   . Last Alcohol Screening Score (AUDIT):   Depression (PHQ2-9): Low Risk   . PHQ-2 Score: 0  Financial Resource Strain:   . Difficulty of Paying Living Expenses:   Food Insecurity:   . Worried About Charity fundraiser in the Last Year:   . [employer] of Food in the Last Year:   Housing: [place]   . Last Housing Risk Score: 0  Physical Activity:   . Days of Exercise per Week:   . Minutes of Exercise per Session:   Social Connections:   . Frequency of Communication with Friends and Family:   . Frequency of Social Gatherings with Friends and Family:   . Attends Religious Services:   . Active Member of Clubs or Organizations:   . Attends Archivist Meetings:   Marland Kitchen Marital Status:   Stress:   . Feeling of Stress :   Tobacco Use: Low Risk   . Smoking Tobacco Use: Never Smoker  . Smokeless Tobacco Use: Never Used  Transportation Needs: No Transportation Needs  . Lack of Transportation (Medical): No  . Lack of Transportation (Non-Medical): No    Current Diagnosis/Assessment:  Goals Addressed  This Visit's Progress   . Pharmacy Care Plan HLD       CARE PLAN ENTRY (see longitudinal plan of care for additional care plan information)  Current Barriers:  . Uncontrolled hyperlipidemia, complicated by age and HTN . Current antihyperlipidemic regimen: atorvastatin . Previous antihyperlipidemic medications tried none noted . Most recent lipid panel:     Component Value Date/Time   CHOL 113 10/01/2019 1156   TRIG 92 10/01/2019 1156   HDL 31 (L) 10/01/2019 1156   CHOLHDL 3.6 10/01/2019 1156   CHOLHDL 3.3 03/09/2017 0226   VLDL 10 03/09/2017 0226   LDLCALC 64 10/01/2019 1156   . ASCVD risk enhancing conditions: age >30, DM, HTN, CKD, CHF, current  smoker   Pharmacist Clinical Goal(s):  Marland Kitchen Over the next 30 days, patient will work with PharmD and providers towards optimized antihyperlipidemic therapy  Interventions: . Comprehensive medication review performed; medication list updated in electronic medical record.  Bertram Savin care team collaboration (see longitudinal plan of care) . Ensure safety, efficacy, and affordability of medications   Patient Self Care Activities:  . Patient will focus on medication adherence by continuing to use weekly medication organizer. Pharmacist will follow-up in 30 days.  . Patient will continue to eat a heart healthy meal of lean protein and vegetables daily. Pharmacist will follow-up in 30 days.   Please see past updates related to this goal by clicking on the "Past Updates" button in the selected goal      . Pharmacy Care Plan HTN       CARE PLAN ENTRY (see longitudinal plan of care for additional care plan information)  Current Barriers:  . Uncontrolled hypertension, complicated by age . Current antihypertensive regimen: diltiazem, hydralazine, carvedilol . Previous antihypertensives tried: n/a . Last practice recorded BP readings:  BP Readings from Last 3 Encounters:  10/01/19 128/68  07/02/19 (!) 144/70  06/06/19 (!) 142/72 .   Marland Kitchen Current home BP readings: 144-151/63-69 mmHg . Most recent eGFR/CrCl: No results found for: EGFR  No components found for: CRCL  Pharmacist Clinical Goal(s):  Marland Kitchen Over the next 30 days, patient will work with PharmD and providers to optimize antihypertensive regimen . Ensure safety, efficacy, and affordability of medications  Interventions: . Inter-disciplinary care team collaboration (see longitudinal plan of care) . Comprehensive medication review performed; medication list updated in the electronic medical record.   Patient Self Care Activities:  . Patient will continue to check BP weekly , document, and provide at future appointments . Patient  will focus on medication adherence by continuing to use weekly pill orgainzer for optimal adherence.  Please see past updates related to this goal by clicking on the "Past Updates" button in the selected goal         AFIB   Patient is currently rate controlled.  Patient has failed these meds in past: n/a Patient is currently controlled on the following medications: carvedilol 12.5 mg bid, eliquis 2.5 mg bid  We discussed:  diet and exercise extensively. Patient does not indicate any problems. [name] not currently exercising but eats very healthy. Eats a balanced diet.   Plan  Continue current medications   Hyperlipidemia   Lipid Panel     Component Value Date/Time   CHOL 113 10/01/2019 1156   TRIG 92 10/01/2019 1156   HDL 31 (L) 10/01/2019 1156   CHOLHDL 3.6 10/01/2019 1156   CHOLHDL 3.3 03/09/2017 0226   VLDL 10 03/09/2017 0226   LDLCALC 64 10/01/2019 1156  LABVLDL 18 10/01/2019 1156     The ASCVD Risk score Mikey Bussing DC Jr., et al., 2013) failed to calculate for the following reasons:   The 2013 ASCVD risk score is only valid for ages 40 to 36   The patient has a prior MI or stroke diagnosis   Patient has failed these meds in past: n/a Patient is currently controlled on the following medications: Atorvastatin 20 mg daily  We discussed:  diet and exercise extensively. Cooks for her son who lives with him. Has not started walking or exercising.     Plan  Continue current medications   ,  Hypertension   BP today is: above goal of <140/90 at home recently.  Office blood pressures are  BP Readings from Last 3 Encounters:  10/01/19 128/68  07/02/19 (!) 144/70  06/06/19 (!) 142/72    Patient has failed these meds in the past: none recalled Patient is currently controlled on the following medications: diltiazem cd 180 mg daily, hydralazine 50 mg bid  Patient checks BP at home weekly  Patient home BP readings are ranging: Hasn't checked this week but in office  was at goal  We discussed diet and exercise extensively. Likes to eat "regular food". Meat and a couple vegetables (green beans, green peas, potatoes). Chicken is favorite meat. Occasionally eats pork chops. Patient educated on sodium intake and any fluid retention.  Plan  Continue current medications    Hypothyroidism   TSH  Date Value Ref Range Status  03/10/2017 2.596 0.350 - 4.500 uIU/mL Final    Comment:    Performed by a 3rd Generation assay with a functional sensitivity of <=0.01 uIU/mL.     Patient has failed these meds in past: n/a Patient is currently controlled on the following medications: levothyroxine 25 mcg daily  We discussed: Patient reports things are going well with her medications. Her son picks them up and she takes them in an Environmental education officer.   Plan  Continue current medications    and   Dementia   Patient has failed these meds in past: n/a Patient is currently controlled on the following medications: memantine 10 mg bid  We discussed: Patient's son lives with her. She cooks and washes clothes and he manages yard work. Patient reports that she can still drive but her son is driving for her. Patient thinks it is good he lives with her because helps her keep on schedule.  Plan  Continue current medications  Health Maintenance   Patient is currently controlled on the following medications:  Sodium chloride tablet - general health  We discussed:  Patient is pleased with current health. Recommended continuing medications as prescribed.   Plan  Continue current medications Vaccines   Reviewed and discussed patient's vaccination history.    Immunization History  Administered Date(s) Administered  . Influenza,inj,quad, With Preservative 02/09/2018  . Influenza-Unspecified 02/14/2018  . PFIZER SARS-COV-2 Vaccination 06/02/2019, 06/23/2019   Plan  Recommended patient receive flu vaccine in office..   Medication Management   Pt uses [place] [facility] for all medications Uses weekly pill box Pt endorses 100% compliance  We discussed: Patient reports that her medications are working well for her and she takes them as prescribed.   Plan  Continue current medication administration strategy.    Follow up: 3 months

## 2019-10-08 ENCOUNTER — Other Ambulatory Visit: Payer: Self-pay | Admitting: Physician Assistant

## 2019-12-03 ENCOUNTER — Other Ambulatory Visit: Payer: Self-pay

## 2019-12-03 ENCOUNTER — Ambulatory Visit: Payer: PPO | Admitting: Cardiology

## 2019-12-03 ENCOUNTER — Encounter: Payer: Self-pay | Admitting: Cardiology

## 2019-12-03 VITALS — BP 124/66 | HR 60 | Ht 63.5 in | Wt 144.2 lb

## 2019-12-03 DIAGNOSIS — I639 Cerebral infarction, unspecified: Secondary | ICD-10-CM | POA: Diagnosis not present

## 2019-12-03 DIAGNOSIS — I10 Essential (primary) hypertension: Secondary | ICD-10-CM | POA: Diagnosis not present

## 2019-12-03 DIAGNOSIS — I480 Paroxysmal atrial fibrillation: Secondary | ICD-10-CM | POA: Diagnosis not present

## 2019-12-03 DIAGNOSIS — E782 Mixed hyperlipidemia: Secondary | ICD-10-CM

## 2019-12-03 DIAGNOSIS — R5383 Other fatigue: Secondary | ICD-10-CM | POA: Diagnosis not present

## 2019-12-03 NOTE — Patient Instructions (Signed)
Medication Instructions:  None  *If you need a refill on your cardiac medications before your next appointment, please call your pharmacy*   Lab Work: None  If you have labs (blood work) drawn today and your tests are completely normal, you will receive your results only by: Marland Kitchen MyChart Message (if you have MyChart) OR . A paper copy in the mail If you have any lab test that is abnormal or we need to change your treatment, we will call you to review the results.   Testing/Procedures: None   Follow-Up: At [facility], you and your health needs are our priority.  As part of our continuing mission to provide you with exceptional heart care, we have created designated Provider Care Teams.  These Care Teams include your primary Cardiologist (physician) and Advanced Practice Providers (APPs -  Physician Assistants and Nurse Practitioners) who all work together to provide you with the care you need, when you need it.  We recommend signing up for the patient portal called "MyChart".  Sign up information is provided on this After Visit Summary.  MyChart is used to connect with patients for Virtual Visits (Telemedicine).  Patients are able to view lab/test results, encounter notes, upcoming appointments, etc.  Non-urgent messages can be sent to your provider as well.   To learn more about what you can do with MyChart, go to [email or web address].    Your next appointment:   6 month(s)  The format for your next appointment:   In Person  Provider:   [name], MD   Other Instructions

## 2019-12-03 NOTE — Progress Notes (Signed)
Cardiology Office Note:    Date:  12/03/2019   ID:  Sherry Hess, DOB 29-Jan-1931, MRN [id number]  PCP:  [name], MD  Cardiologist:  [name], MD    Referring MD: [name], MD   No chief complaint on file. I am doing fine  History of Present Illness:    Sherry Hess is a 86 y.o. female with past medical history significant for paroxysmal atrial fibrillation, TIA, atypical chest pain, essential hypertension, dyslipidemia.  She comes today to my office for follow-up.  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  Overall seems to be doing quite well.  Past Medical History:  Diagnosis Date  . Anxiety   . Benign neoplasm of rectum   . Benign neoplasm of sigmoid colon   . Chest pain in adult 12/27/2015  . CVA (cerebral vascular accident) ([place]) 03/08/2017  . Dyspepsia 03/09/2017  . Essential hypertension 11/20/2014  . Fatigue 12/10/2016  . High cholesterol   . HLD (hyperlipidemia) 03/09/2017  . Hx of transient ischemic attack (TIA) 10/18/2016  . Hypertension   . Normocytic anemia   . Occult blood positive stool 03/09/2017  . Paroxysmal atrial fibrillation ([place]) 02/23/2017  . Syncope 12/10/2016  . TIA (transient ischemic attack)     Past Surgical History:  Procedure Laterality Date  . COLONOSCOPY N/A 03/11/2017   Procedure: COLONOSCOPY;  Surgeon: Gatha Mayer, MD;  Location: [facility] ENDOSCOPY;  Service: Endoscopy;  Laterality: N/A;  . ESOPHAGOGASTRODUODENOSCOPY N/A 03/11/2017   Procedure: ESOPHAGOGASTRODUODENOSCOPY (EGD);  Surgeon: Gatha Mayer, MD;  Location: [facility] ENDOSCOPY;  Service: Endoscopy;  Laterality: N/A;  . TONSILLECTOMY    . VESICOVAGINAL FISTULA CLOSURE W/ TAH      Current Medications: Current Meds  Medication Sig  . atorvastatin (LIPITOR) 20 MG tablet TAKE 1 TABLET BY MOUTH DAILY  . Calcium Citrate (CAL-CITRATE  PO) Take by mouth daily.  . carvedilol (COREG) 12.5 MG tablet TAKE 1 TABLET BY MOUTH 2 TIMES DAILY WITH FOOD  . diltiazem (CARDIZEM CD) 180 MG 24 hr capsule TAKE 1 CAPSULE BY MOUTH DAILY  . ELIQUIS 2.5 MG TABS tablet TAKE 1 TABLET BY MOUTH TWICE DAILY  . Ferrous Sulfate (CVS SLOW RELEASE IRON PO) Take 1 tablet daily by mouth.  . Fish Oil-Cholecalciferol (FISH OIL + D3) 1000-1000 MG-UNIT CAPS Take by mouth.  Marland Kitchen FLUoxetine (PROZAC) 10 MG capsule TAKE 1 CAPSULE BY MOUTH ONCE DAILY  . hydrALAZINE (APRESOLINE) 50 MG tablet TAKE 1 TABLET BY MOUTH 2 TIMES DAILY  . levothyroxine (SYNTHROID) 25 MCG tablet Take 25 mcg by mouth daily.   . memantine (NAMENDA) 10 MG tablet TAKE 1 TABLET BY MOUTH 2 TIMES DAILY  . Multiple Vitamin (MULTIVITAMIN) tablet Take 1 tablet by mouth daily.  Marland Kitchen omeprazole (PRILOSEC) 20 MG capsule Take 20 mg by mouth daily.   . sodium chloride 1 g tablet Take 1 g by mouth daily.     Allergies:   Minoxidil, Norvasc [amlodipine besylate], and Clonidine derivatives   Social History   Socioeconomic History  . Marital status: Widowed    Spouse name: Not on file  . Number of children: Not on file  . Years of education: Not on file  . Highest education level: Not on file  Occupational History  . Not on file  Tobacco Use  . Smoking status: Never Smoker  .  Smokeless tobacco: Never Used  Vaping Use  . Vaping Use: Never used  Substance and Sexual Activity  . Alcohol use: No  . Drug use: No  . Sexual activity: Not on file  Other Topics Concern  . Not on file  Social History Narrative  . Not on file   Social Determinants of Health   Financial Resource Strain:   . Difficulty of Paying Living Expenses:   Food Insecurity: No Food Insecurity  . Worried About Charity fundraiser in the Last Year: Never true  . Ran Out of Food in the Last Year: Never true  Transportation Needs: No Transportation Needs  . Lack of Transportation (Medical): No  . Lack of Transportation  (Non-Medical): No  Physical Activity:   . Days of Exercise per Week:   . Minutes of Exercise per Session:   Stress:   . Feeling of Stress :   Social Connections:   . Frequency of Communication with Friends and Family:   . Frequency of Social Gatherings with Friends and Family:   . Attends Religious Services:   . Active Member of Clubs or Organizations:   . Attends Archivist Meetings:   Marland Kitchen Marital Status:      Family History: [name] family history includes Cancer in her brother and father; Stroke in her mother. There is no history of Ataxia, Chorea, Dementia, Mental retardation, Migraines, Multiple sclerosis, Neurofibromatosis, Neuropathy, Parkinsonism, or Seizures. ROS:   Please see the history of present illness.    All 14 point review of systems negative except as described per history of present illness  EKGs/Labs/Other Studies Reviewed:      Recent Labs: 10/01/2019: ALT 7; BUN 24; Creatinine, Ser 1.82; Hemoglobin 14.5; Platelets 181; Potassium 4.0; Sodium 141  Recent Lipid Panel    Component Value Date/Time   CHOL 113 10/01/2019 1156   TRIG 92 10/01/2019 1156   HDL 31 (L) 10/01/2019 1156   CHOLHDL 3.6 10/01/2019 1156   CHOLHDL 3.3 03/09/2017 0226   VLDL 10 03/09/2017 0226   LDLCALC 64 10/01/2019 1156    Physical Exam:    VS:  BP 124/66 (BP Location: Left Arm, Patient Position: Sitting, Cuff Size: Normal)   Pulse 60   Ht 5' 3.5" (1.613 m)   Wt 144 lb 3.2 oz (65.4 kg)   SpO2 (!) 87%   BMI 25.14 kg/m     Wt Readings from Last 3 Encounters:  12/03/19 144 lb 3.2 oz (65.4 kg)  10/01/19 145 lb (65.8 kg)  07/02/19 144 lb 6.4 oz (65.5 kg)     GEN:  Well nourished, well developed in no acute distress HEENT: Normal NECK: No JVD; No carotid bruits LYMPHATICS: No lymphadenopathy CARDIAC: RRR, no murmurs, no rubs, no gallops RESPIRATORY:  Clear to auscultation without rales, wheezing or rhonchi  ABDOMEN: Soft, non-tender,  non-distended MUSCULOSKELETAL:  No edema; No deformity  SKIN: Warm and dry LOWER EXTREMITIES: no swelling NEUROLOGIC:  Alert and oriented x 3 PSYCHIATRIC:  Normal affect   ASSESSMENT:    1. Paroxysmal atrial fibrillation (HCC)   2. Essential hypertension   3. Cerebrovascular accident (CVA), unspecified mechanism ([place])   4. Mixed hyperlipidemia   5. Fatigue, unspecified type    PLAN:    In order of problems listed above:  1. Paroxysmal atrial fibrillation, anticoagulated with Eliquis 2.5 twice daily which is appropriate dose, will continue present management.  Denies having any palpitations. 2. Essential hypertension blood pressure today 124/66 excellently controlled continue present management.  3. History of TIA/CVA.  Stable no new problems. 4. Dyslipidemia: I did review her fasting lipid profile done by primary care physician in May HDL 31 LDL 64.  This is acceptable cholesterol profile with Lipitor 20 on board. 5. I did review note by prior her primary care physician thinks looking stable.  I think she is doing very well for her age we will continue present management   Medication Adjustments/Labs and Tests Ordered: Current medicines are reviewed at length with the patient today.  Concerns regarding medicines are outlined above.  No orders of the defined types were placed in this encounter.  Medication changes: No orders of the defined types were placed in this encounter.   Signed, Park Liter, MD, [facility] 12/03/2019 9:58 AM    [place]

## 2019-12-27 ENCOUNTER — Other Ambulatory Visit: Payer: Self-pay | Admitting: Family Medicine

## 2019-12-27 ENCOUNTER — Other Ambulatory Visit: Payer: Self-pay | Admitting: Physician Assistant

## 2019-12-27 DIAGNOSIS — I129 Hypertensive chronic kidney disease with stage 1 through stage 4 chronic kidney disease, or unspecified chronic kidney disease: Secondary | ICD-10-CM

## 2020-01-02 ENCOUNTER — Telehealth: Payer: PPO

## 2020-01-03 ENCOUNTER — Telehealth: Payer: Self-pay

## 2020-01-03 DIAGNOSIS — F0390 Unspecified dementia without behavioral disturbance: Secondary | ICD-10-CM | POA: Diagnosis not present

## 2020-01-03 DIAGNOSIS — E871 Hypo-osmolality and hyponatremia: Secondary | ICD-10-CM | POA: Diagnosis not present

## 2020-01-03 DIAGNOSIS — I4891 Unspecified atrial fibrillation: Secondary | ICD-10-CM | POA: Diagnosis not present

## 2020-01-03 DIAGNOSIS — Z8673 Personal history of transient ischemic attack (TIA), and cerebral infarction without residual deficits: Secondary | ICD-10-CM | POA: Diagnosis not present

## 2020-01-03 DIAGNOSIS — N1832 Chronic kidney disease, stage 3b: Secondary | ICD-10-CM | POA: Diagnosis not present

## 2020-01-03 DIAGNOSIS — I129 Hypertensive chronic kidney disease with stage 1 through stage 4 chronic kidney disease, or unspecified chronic kidney disease: Secondary | ICD-10-CM | POA: Diagnosis not present

## 2020-01-03 NOTE — Progress Notes (Signed)
Unsuccessful outreach to patient. Left message for her to return call.  Martinique Uselman, [place] Pharmacist Assistant  [phone]

## 2020-01-11 ENCOUNTER — Other Ambulatory Visit: Payer: Self-pay | Admitting: Physician Assistant

## 2020-01-14 ENCOUNTER — Telehealth: Payer: Self-pay

## 2020-01-14 NOTE — Progress Notes (Signed)
Chronic Care Management Pharmacy Assistant   Name: Shunte Senseney  MRN: [id number] DOB: 03/12/1931  Reason for Encounter: Hypertension Disease State Call  PCP : [name], MD  Allergies:   Allergies  Allergen Reactions  . Minoxidil     rash  . Norvasc [Amlodipine Besylate]     rash  . Clonidine Derivatives Rash    [name]    Medications: Outpatient Encounter Medications as of 01/14/2020  Medication Sig  . atorvastatin (LIPITOR) 20 MG tablet TAKE 1 TABLET BY MOUTH DAILY  . Calcium Citrate (CAL-CITRATE PO) Take by mouth daily.  . carvedilol (COREG) 12.5 MG tablet TAKE 1 TABLET BY MOUTH 2 TIMES DAILY WITH FOOD  . diltiazem (CARDIZEM CD) 180 MG 24 hr capsule TAKE 1 CAPSULE BY MOUTH DAILY  . ELIQUIS 2.5 MG TABS tablet TAKE 1 TABLET BY MOUTH TWICE DAILY  . Ferrous Sulfate (CVS SLOW RELEASE IRON PO) Take 1 tablet daily by mouth.  . Fish Oil-Cholecalciferol (FISH OIL + D3) 1000-1000 MG-UNIT CAPS Take by mouth.  Marland Kitchen FLUoxetine (PROZAC) 10 MG capsule TAKE 1 CAPSULE BY MOUTH ONCE DAILY  . hydrALAZINE (APRESOLINE) 50 MG tablet TAKE 1 TABLET BY MOUTH 2 TIMES DAILY  . levothyroxine (SYNTHROID) 25 MCG tablet Take 25 mcg by mouth daily.   . memantine (NAMENDA) 10 MG tablet TAKE 1 TABLET BY MOUTH 2 TIMES DAILY  . Multiple Vitamin (MULTIVITAMIN) tablet Take 1 tablet by mouth daily.  Marland Kitchen omeprazole (PRILOSEC) 20 MG capsule Take 20 mg by mouth daily.   . sodium chloride 1 g tablet Take 1 g by mouth daily.   No facility-administered encounter medications on file as of 01/14/2020.    Current Diagnosis: Patient Active Problem List   Diagnosis Date Noted  . Hypertension, renal disease, stage 1-4 or unspecified chronic kidney disease 07/02/2019  . Atrial fibrillation ([place]) 07/02/2019  . CKD (chronic kidney disease) stage 4, GFR 15-29 ml/min (HCC) 07/02/2019  . Vascular dementia without behavioral disturbance ([place]) 07/02/2019  . Benign neoplasm of sigmoid colon   . Benign  neoplasm of rectum   . Mixed hyperlipidemia 03/09/2017  . Occult blood positive stool 03/09/2017  . Dyspepsia 03/09/2017  . Normocytic anemia   . CVA (cerebral vascular accident) ([place]) 03/08/2017  . Paroxysmal atrial fibrillation ([place]) 02/23/2017  . Syncope 12/10/2016  . Fatigue 12/10/2016  . Hx of transient ischemic attack (TIA) 10/18/2016  . Chest pain in adult 12/27/2015  . Essential hypertension 11/20/2014    Goals Addressed   None     Follow-Up:  Pharmacist Review  Reviewed chart prior to disease state call. Spoke with patient regarding BP  Recent Office Vitals: BP Readings from Last 3 Encounters:  12/03/19 124/66  10/01/19 128/68  07/02/19 (!) 144/70   Pulse Readings from Last 3 Encounters:  12/03/19 60  10/01/19 61  07/02/19 68    Wt Readings from Last 3 Encounters:  12/03/19 144 lb 3.2 oz (65.4 kg)  10/01/19 145 lb (65.8 kg)  07/02/19 144 lb 6.4 oz (65.5 kg)     Kidney Function Lab Results  Component Value Date/Time   CREATININE 1.82 (H) 10/01/2019 11:56 AM   CREATININE 1.93 (H) 07/02/2019 11:07 AM   GFRNONAA 25 (L) 10/01/2019 11:56 AM   GFRAA 29 (L) 10/01/2019 11:56 AM    BMP Latest Ref Rng & Units 10/01/2019 07/02/2019 07/06/2017  Glucose 65 - 99 mg/dL 85 87 102(H)  BUN 8 - 27 mg/dL 24 22 33(H)  Creatinine 0.57 -  1.00 mg/dL 1.82(H) 1.93(H) 1.47(H)  BUN/Creat Ratio 12 - 28 13 11(L) 22  Sodium 134 - 144 mmol/L 141 142 132(L)  Potassium 3.5 - 5.2 mmol/L 4.0 4.4 4.3  Chloride 96 - 106 mmol/L 100 101 92(L)  CO2 20 - 29 mmol/L 28 - 25  Calcium 8.7 - 10.3 mg/dL 9.3 9.4 9.5    . Current antihypertensive regimen:  o Carvedilol 12.5 Tablet Twice Daily  o Hydralazine 50 Mg Tablet Twice Daily . How often are you checking your Blood Pressure? 1-2x per week . Current home BP readings:  o Patient son states she took her blood pressure last week and it was 124/67. He does not remember the details of the day and time. . What recent interventions/DTPs have been  made by any provider to improve Blood Pressure control since last CPP Visit: None ID . Any recent hospitalizations or ED visits since last visit with CPP? No . What diet changes have been made to improve Blood Pressure Control?  o Patient son reports his mother cooks at home and her other son brings her food. . What exercise is being done to improve your Blood Pressure Control?  o Patient son states his mother works in the garden but does not exercise like she use to.Patient son reports she use to walk two miles with her friend everyday  until her friend had a knee replacement.  Adherence Review: Is the patient currently on ACE/ARB medication? No Does the patient have >5 day gap between last estimated fill dates? Yes   [place] Pharmacist Assistant [phone]    Maryjean Ka

## 2020-01-19 ENCOUNTER — Other Ambulatory Visit: Payer: Self-pay | Admitting: Family Medicine

## 2020-01-29 ENCOUNTER — Ambulatory Visit: Payer: PPO

## 2020-02-18 ENCOUNTER — Other Ambulatory Visit: Payer: Self-pay | Admitting: Family Medicine

## 2020-02-26 ENCOUNTER — Other Ambulatory Visit: Payer: Self-pay | Admitting: Family Medicine

## 2020-03-03 ENCOUNTER — Telehealth: Payer: Self-pay

## 2020-03-03 NOTE — Progress Notes (Signed)
Chronic Care Management Pharmacy Assistant   Name: Sherry Hess  MRN: [id number] DOB: 07-Jul-1930  Reason for Encounter: Disease State Hypertension Adherence Call.  Patient Questions:   1.  Have you seen any other providers since your last visit?  No   2.  Any changes in your medicines or health? No    PCP : [name], MD  Allergies:   Allergies  Allergen Reactions  . Minoxidil     rash  . Norvasc [Amlodipine Besylate]     rash  . Clonidine Derivatives Rash    [name]    Medications: Outpatient Encounter Medications as of 03/03/2020  Medication Sig  . atorvastatin (LIPITOR) 20 MG tablet TAKE 1 TABLET BY MOUTH DAILY  . Calcium Citrate (CAL-CITRATE PO) Take by mouth daily.  . carvedilol (COREG) 12.5 MG tablet TAKE 1 TABLET BY MOUTH 2 TIMES DAILY WITH FOOD  . diltiazem (CARDIZEM CD) 180 MG 24 hr capsule TAKE 1 CAPSULE BY MOUTH DAILY  . ELIQUIS 2.5 MG TABS tablet TAKE 1 TABLET BY MOUTH TWICE DAILY  . Ferrous Sulfate (CVS SLOW RELEASE IRON PO) Take 1 tablet daily by mouth.  . Fish Oil-Cholecalciferol (FISH OIL + D3) 1000-1000 MG-UNIT CAPS Take by mouth.  Marland Kitchen FLUoxetine (PROZAC) 10 MG capsule TAKE 1 CAPSULE BY MOUTH ONCE DAILY  . hydrALAZINE (APRESOLINE) 50 MG tablet TAKE 1 TABLET BY MOUTH 2 TIMES DAILY  . levothyroxine (SYNTHROID) 25 MCG tablet TAKE 1 TABLET BY MOUTH ONCE DAILY ON AN EMPTY STOMACH 30 MINUTES BEFORE BREAKFAST  . memantine (NAMENDA) 10 MG tablet TAKE 1 TABLET BY MOUTH 2 TIMES DAILY  . Multiple Vitamin (MULTIVITAMIN) tablet Take 1 tablet by mouth daily.  Marland Kitchen omeprazole (PRILOSEC) 20 MG capsule TAKE 1 CAPSULE BY MOUTH DAILY  . sodium chloride 1 g tablet Take 1 g by mouth daily.   No facility-administered encounter medications on file as of 03/03/2020.    Current Diagnosis: Patient Active Problem List   Diagnosis Date Noted  . Hypertension, renal disease, stage 1-4 or unspecified chronic kidney disease 07/02/2019  . Atrial fibrillation  ([place]) 07/02/2019  . CKD (chronic kidney disease) stage 4, GFR 15-29 ml/min (HCC) 07/02/2019  . Vascular dementia without behavioral disturbance ([place]) 07/02/2019  . Benign neoplasm of sigmoid colon   . Benign neoplasm of rectum   . Mixed hyperlipidemia 03/09/2017  . Occult blood positive stool 03/09/2017  . Dyspepsia 03/09/2017  . Normocytic anemia   . CVA (cerebral vascular accident) ([place]) 03/08/2017  . Paroxysmal atrial fibrillation ([place]) 02/23/2017  . Syncope 12/10/2016  . Fatigue 12/10/2016  . Hx of transient ischemic attack (TIA) 10/18/2016  . Chest pain in adult 12/27/2015  . Essential hypertension 11/20/2014   Reviewed chart prior to disease state call. Spoke with patient regarding BP  Recent Office Vitals: BP Readings from Last 3 Encounters:  12/03/19 124/66  10/01/19 128/68  07/02/19 (!) 144/70   Pulse Readings from Last 3 Encounters:  12/03/19 60  10/01/19 61  07/02/19 68    Wt Readings from Last 3 Encounters:  12/03/19 144 lb 3.2 oz (65.4 kg)  10/01/19 145 lb (65.8 kg)  07/02/19 144 lb 6.4 oz (65.5 kg)     Kidney Function Lab Results  Component Value Date/Time   CREATININE 1.82 (H) 10/01/2019 11:56 AM   CREATININE 1.93 (H) 07/02/2019 11:07 AM   GFRNONAA 25 (L) 10/01/2019 11:56 AM   GFRAA 29 (L) 10/01/2019 11:56 AM    BMP Latest Ref Rng &  Units 10/01/2019 07/02/2019 07/06/2017  Glucose 65 - 99 mg/dL 85 87 102(H)  BUN 8 - 27 mg/dL 24 22 33(H)  Creatinine 0.57 - 1.00 mg/dL 1.82(H) 1.93(H) 1.47(H)  BUN/Creat Ratio 12 - 28 13 11(L) 22  Sodium 134 - 144 mmol/L 141 142 132(L)  Potassium 3.5 - 5.2 mmol/L 4.0 4.4 4.3  Chloride 96 - 106 mmol/L 100 101 92(L)  CO2 20 - 29 mmol/L 28 - 25  Calcium 8.7 - 10.3 mg/dL 9.3 9.4 9.5    . Current antihypertensive regimen:  o Cardizem 180 mg one daily o Hydralazine 50 mg one tablet two times a day o Carvedilol 12.5 mg one tablet two times a day.  . How often are you checking your Blood Pressure? daily   . Current home  BP readings: 130/80  . What recent interventions/DTPs have been made by any provider to improve Blood Pressure control since last CPP Visit: Son states that patient has been taking his medications as directed by provider.  . Any recent hospitalizations or ED visits since last visit with CPP? No . What diet changes have been made to improve Blood Pressure Control? Son states patient is eating okay, his brother lives with her and he takes care to see that she is eating , states they do eat out some.  . What exercise is being done to improve your Blood Pressure Control?  o Son states patient is not as active as she was, but she still takes care of dishes, and keeps the house cleaned.   Adherence Review: Is the patient currently on ACE/ARB medication? No Does the patient have >5 day gap between last estimated fill dates? No    Goals Addressed            This Visit's Progress   . Pharmacy Care Plan HTN   On track    CARE PLAN ENTRY (see longitudinal plan of care for additional care plan information)  Current Barriers:  . Uncontrolled hypertension, complicated by age . Current antihypertensive regimen: diltiazem, hydralazine, carvedilol . Previous antihypertensives tried: n/a . Last practice recorded BP readings:  BP Readings from Last 3 Encounters:  10/01/19 128/68  07/02/19 (!) 144/70  06/06/19 (!) 142/72 .   Marland Kitchen Current home BP readings: 144-151/63-69 mmHg . Most recent eGFR/CrCl: No results found for: EGFR  No components found for: CRCL  Pharmacist Clinical Goal(s):  Marland Kitchen Over the next 30 days, patient will work with PharmD and providers to optimize antihypertensive regimen . Ensure safety, efficacy, and affordability of medications  Interventions: . Inter-disciplinary care team collaboration (see longitudinal plan of care) . Comprehensive medication review performed; medication list updated in the electronic medical record.   Patient Self Care Activities:  . Patient will  continue to check BP weekly , document, and provide at future appointments . Patient will focus on medication adherence by continuing to use weekly pill orgainzer for optimal adherence.  Please see past updates related to this goal by clicking on the "Past Updates" button in the selected goal        Reviewed chart prior to disease state call. Spoke with patient regarding BP  Recent Office Vitals: BP Readings from Last 3 Encounters:  12/03/19 124/66  10/01/19 128/68  07/02/19 (!) 144/70   Pulse Readings from Last 3 Encounters:  12/03/19 60  10/01/19 61  07/02/19 68    Wt Readings from Last 3 Encounters:  12/03/19 144 lb 3.2 oz (65.4 kg)  10/01/19 145 lb (65.8 kg)  07/02/19 144 lb 6.4 oz (65.5 kg)     Kidney Function Lab Results  Component Value Date/Time   CREATININE 1.82 (H) 10/01/2019 11:56 AM   CREATININE 1.93 (H) 07/02/2019 11:07 AM   GFRNONAA 25 (L) 10/01/2019 11:56 AM   GFRAA 29 (L) 10/01/2019 11:56 AM    BMP Latest Ref Rng & Units 10/01/2019 07/02/2019 07/06/2017  Glucose 65 - 99 mg/dL 85 87 102(H)  BUN 8 - 27 mg/dL 24 22 33(H)  Creatinine 0.57 - 1.00 mg/dL 1.82(H) 1.93(H) 1.47(H)  BUN/Creat Ratio 12 - 28 13 11(L) 22  Sodium 134 - 144 mmol/L 141 142 132(L)  Potassium 3.5 - 5.2 mmol/L 4.0 4.4 4.3  Chloride 96 - 106 mmol/L 100 101 92(L)  CO2 20 - 29 mmol/L 28 - 25  Calcium 8.7 - 10.3 mg/dL 9.3 9.4 9.5    Follow-Up:  Pharmacist Review  - Spoke with Son,(Sherry Hess) he is patient's caregiver and he takes care of her medications.  Elly Modena Owens Shark, [place]. Notified.   Judithann Sheen, [facility] Clinical Pharmacist Assistant [phone]

## 2020-03-11 ENCOUNTER — Encounter: Payer: Self-pay | Admitting: Family Medicine

## 2020-03-11 ENCOUNTER — Ambulatory Visit (INDEPENDENT_AMBULATORY_CARE_PROVIDER_SITE_OTHER): Payer: PPO | Admitting: Family Medicine

## 2020-03-11 ENCOUNTER — Other Ambulatory Visit: Payer: Self-pay

## 2020-03-11 VITALS — BP 136/62 | HR 64 | Temp 97.4°F | Ht 64.0 in | Wt 143.0 lb

## 2020-03-11 DIAGNOSIS — M8589 Other specified disorders of bone density and structure, multiple sites: Secondary | ICD-10-CM

## 2020-03-11 DIAGNOSIS — Z23 Encounter for immunization: Secondary | ICD-10-CM | POA: Diagnosis not present

## 2020-03-11 DIAGNOSIS — Z6824 Body mass index (BMI) 24.0-24.9, adult: Secondary | ICD-10-CM | POA: Diagnosis not present

## 2020-03-11 DIAGNOSIS — Z0000 Encounter for general adult medical examination without abnormal findings: Secondary | ICD-10-CM | POA: Diagnosis not present

## 2020-03-11 MED ORDER — DONEPEZIL HCL 5 MG PO TABS
5.0000 mg | ORAL_TABLET | Freq: Every day | ORAL | 0 refills | Status: DC
Start: 2020-03-11 — End: 2020-04-13

## 2020-03-11 NOTE — Patient Instructions (Addendum)
Recommend shingrix vaccine at pharmacy (2 series vaccine.) Ordering bone density.  Start on aricept 5 mg once at night.  Continue namenda 10 mg twice daily     Preventive Care 65 Years and Older, Female Preventive care refers to lifestyle choices and visits with your health care provider that can promote health and wellness. This includes:  A yearly physical exam. This is also called an annual well check.  Regular dental and eye exams.  Immunizations.  Screening for certain conditions.  Healthy lifestyle choices, such as diet and exercise. What can I expect for my preventive care visit? Physical exam Your health care provider will check:  Height and weight. These may be used to calculate body mass index (BMI), which is a measurement that tells if you are at a healthy weight.  Heart rate and blood pressure.  Your skin for abnormal spots. Counseling Your health care provider may ask you questions about:  Alcohol, tobacco, and drug use.  Emotional well-being.  Home and relationship well-being.  Sexual activity.  Eating habits.  History of falls.  Memory and ability to understand (cognition).  Work and work Statistician.  Pregnancy and menstrual history. What immunizations do I need?  Influenza (flu) vaccine  This is recommended every year. Tetanus, diphtheria, and pertussis (Tdap) vaccine  You may need a Td booster every 10 years. Varicella (chickenpox) vaccine  You may need this vaccine if you have not already been vaccinated. Zoster (shingles) vaccine  You may need this after age 24. Pneumococcal conjugate (PCV13) vaccine  One dose is recommended after age 34. Pneumococcal polysaccharide (PPSV23) vaccine  One dose is recommended after age 59. Measles, mumps, and rubella (MMR) vaccine  You may need at least one dose of MMR if you were born in 1957 or later. You may also need a second dose. Meningococcal conjugate (MenACWY) vaccine  You may need  this if you have certain conditions. Hepatitis A vaccine  You may need this if you have certain conditions or if you travel or work in places where you may be exposed to hepatitis A. Hepatitis B vaccine  You may need this if you have certain conditions or if you travel or work in places where you may be exposed to hepatitis B. Haemophilus influenzae type b (Hib) vaccine  You may need this if you have certain conditions. You may receive vaccines as individual doses or as more than one vaccine together in one shot (combination vaccines). Talk with your health care provider about the risks and benefits of combination vaccines. What tests do I need? Blood tests  Lipid and cholesterol levels. These may be checked every 5 years, or more frequently depending on your overall health.  Hepatitis C test.  Hepatitis B test. Screening  Lung cancer screening. You may have this screening every year starting at age 75 if you have a 30-pack-year history of smoking and currently smoke or have quit within the past 15 years.  Colorectal cancer screening. All adults should have this screening starting at age 61 and continuing until age 16. Your health care provider may recommend screening at age 25 if you are at increased risk. You will have tests every 1-10 years, depending on your results and the type of screening test.  Diabetes screening. This is done by checking your blood sugar (glucose) after you have not eaten for a while (fasting). You may have this done every 1-3 years.  Mammogram. This may be done every 1-2 years. Talk with your health  care provider about how often you should have regular mammograms.  BRCA-related cancer screening. This may be done if you have a family history of breast, ovarian, tubal, or peritoneal cancers. Other tests  Sexually transmitted disease (STD) testing.  Bone density scan. This is done to screen for osteoporosis. You may have this done starting at age 28. Follow  these instructions at home: Eating and drinking  Eat a diet that includes fresh fruits and vegetables, whole grains, lean protein, and low-fat dairy products. Limit your intake of foods with high amounts of sugar, saturated fats, and salt.  Take vitamin and mineral supplements as recommended by your health care provider.  Do not drink alcohol if your health care provider tells you not to drink.  If you drink alcohol: ? Limit how much you have to 0-1 drink a day. ? Be aware of how much alcohol is in your drink. In the [place] one drink equals one 12 oz bottle of beer (355 mL), one 5 oz glass of wine (148 mL), or one 1 oz glass of hard liquor (44 mL). Lifestyle  Take daily care of your teeth and gums.  Stay active. Exercise for at least 30 minutes on 5 or more days each week.  Do not use any products that contain nicotine or tobacco, such as cigarettes, e-cigarettes, and chewing tobacco. If you need help quitting, ask your health care provider.  If you are sexually active, practice safe sex. Use a condom or other form of protection in order to prevent STIs (sexually transmitted infections).  Talk with your health care provider about taking a low-dose aspirin or statin. What's next?  Go to your health care provider once a year for a well check visit.  Ask your health care provider how often you should have your eyes and teeth checked.  Stay up to date on all vaccines. This information is not intended to replace advice given to you by your health care provider. Make sure you discuss any questions you have with your health care provider. Document Revised: 04/27/2018 Document Reviewed: 04/27/2018 Elsevier Patient Education  2020 [employer].

## 2020-03-11 NOTE — Progress Notes (Signed)
Subjective:  Patient ID: Sherry Hess, female    DOB: Oct 21, 1930  Age: 86 y.o. MRN: [id number]  Chief Complaint  Patient presents with  . Annual Exam    HPI Encounter for general adult medical examination without abnormal findings  Physical ("At Risk" items are starred): Patient's last physical exam was 1 year ago .  Smoking: Life-long non-smoker ;  Physical Activity: Does not exercise however does eat a healthy diet ;  Alcohol/Drug Use: Is a non-drinker; No illicit drug use ;  Patient is not afflicted from Stress Incontinence and Urge Incontinence  Safety: reviewed ; Patient wears a seat belt, has smoke detectors, practices appropriate gun safety, no need to wear sunscreen in not in the sun for extended sun exposure. Dental Care: biannual cleanings, brushes and flosses daily. Ophthalmology/Optometry: Annual visit.  Hearing loss: none Vision impairments: Wears glasses. Patient does not have a living will, however she has a health care of power of attorney.  01/25/2018  Mammogram last done 01/25/2018 patient prefers to no longer have mammograms due to her age.  Fall Risk  03/11/2020 10/01/2019 07/08/2019 02/17/2017 08/17/2016  Falls in the past year? 0 0 0 Yes Yes  Number falls in past yr: 0 0 0 2 or more 1  Injury with Fall? 0 0 0 - Yes  Risk for fall due to : Impaired vision - - - -  Follow up Falls evaluation completed - - Falls evaluation completed -     Depression screen [facility] 2/9 03/11/2020 10/05/2019 07/08/2019  Decreased Interest 0 0 0  Down, Depressed, Hopeless 0 0 0  PHQ - 2 Score 0 0 0       Functional Status Survey: Is the patient deaf or have difficulty hearing?: No Does the patient have difficulty seeing, even when wearing glasses/contacts?: Yes (wears glasses) Does the patient have difficulty concentrating, remembering, or making decisions?: No Does the patient have difficulty walking or climbing stairs?: No Does the patient have difficulty dressing or  bathing?: No Does the patient have difficulty doing errands alone such as visiting a doctor's office or shopping?: Yes (Son's transport patient)   Social Hx   Social History   Socioeconomic History  . Marital status: Widowed    Spouse name: Not on file  . Number of children: Not on file  . Years of education: Not on file  . Highest education level: Not on file  Occupational History  . Not on file  Tobacco Use  . Smoking status: Never Smoker  . Smokeless tobacco: Never Used  Vaping Use  . Vaping Use: Never used  Substance and Sexual Activity  . Alcohol use: No  . Drug use: No  . Sexual activity: Not on file  Other Topics Concern  . Not on file  Social History Narrative  . Not on file   Social Determinants of Health   Financial Resource Strain:   . Difficulty of Paying Living Expenses: Not on file  Food Insecurity: No Food Insecurity  . Worried About Charity fundraiser in the Last Year: Never true  . Ran Out of Food in the Last Year: Never true  Transportation Needs: No Transportation Needs  . Lack of Transportation (Medical): No  . Lack of Transportation (Non-Medical): No  Physical Activity:   . Days of Exercise per Week: Not on file  . Minutes of Exercise per Session: Not on file  Stress:   . Feeling of Stress : Not on file  Social Connections:   .  Frequency of Communication with Friends and Family: Not on file  . Frequency of Social Gatherings with Friends and Family: Not on file  . Attends Religious Services: Not on file  . Active Member of Clubs or Organizations: Not on file  . Attends Archivist Meetings: Not on file  . Marital Status: Not on file   Past Medical History:  Diagnosis Date  . Anxiety   . Benign neoplasm of rectum   . Benign neoplasm of sigmoid colon   . Chest pain in adult 12/27/2015  . CVA (cerebral vascular accident) ([place]) 03/08/2017  . Dyspepsia 03/09/2017  . Essential hypertension 11/20/2014  . Fatigue 12/10/2016  . High  cholesterol   . HLD (hyperlipidemia) 03/09/2017  . Hx of transient ischemic attack (TIA) 10/18/2016  . Hypertension   . Normocytic anemia   . Occult blood positive stool 03/09/2017  . Paroxysmal atrial fibrillation ([place]) 02/23/2017  . Syncope 12/10/2016  . TIA (transient ischemic attack)    Family History  Problem Relation Age of Onset  . Cancer Father        lung  . Stroke Mother   . Cancer Brother        x2 bladder  . Dementia Brother   . Atrial fibrillation Son   . Ataxia Neg Hx   . Chorea Neg Hx   . Mental retardation Neg Hx   . Migraines Neg Hx   . Multiple sclerosis Neg Hx   . Neurofibromatosis Neg Hx   . Neuropathy Neg Hx   . Parkinsonism Neg Hx   . Seizures Neg Hx     Review of Systems  Constitutional: Negative for chills, fatigue and fever.  HENT: Negative for congestion, ear pain, rhinorrhea and sore throat.   Respiratory: Negative for cough and shortness of breath.   Cardiovascular: Negative for chest pain.  Gastrointestinal: Negative for abdominal pain, constipation, diarrhea, nausea and vomiting.  Genitourinary: Negative for dysuria and urgency.  Musculoskeletal: Negative for back pain and myalgias.  Neurological: Negative for dizziness, weakness, light-headedness and headaches.  Psychiatric/Behavioral: Negative for dysphoric mood. The patient is not nervous/anxious.      Objective:  BP 136/62   Pulse 64   Temp (!) 97.4 F (36.3 C)   Ht 5\' 4"  (1.626 m)   Wt 143 lb (64.9 kg)   SpO2 94%   BMI 24.55 kg/m   BP/Weight 03/11/2020 12/03/2019 0/93/2671  Systolic BP 245 809 983  Diastolic BP 62 66 68  Wt. (Lbs) 143 144.2 145  BMI 24.55 25.14 25.28    Physical Exam Constitutional:      General: [name] not in acute distress.    Appearance: Normal appearance.  Neurological:     Mental Status: [name] alert.  Psychiatric:        Mood and Affect: Mood normal.        Behavior: Behavior normal.     Lab Results  Component Value Date   WBC 3.2 (L)  10/01/2019   HGB 14.5 10/01/2019   HCT 44.7 10/01/2019   PLT 181 10/01/2019   GLUCOSE 85 10/01/2019   CHOL 113 10/01/2019   TRIG 92 10/01/2019   HDL 31 (L) 10/01/2019   LDLCALC 64 10/01/2019   ALT 7 10/01/2019   AST 23 10/01/2019   NA 141 10/01/2019   K 4.0 10/01/2019   CL 100 10/01/2019   CREATININE 1.82 (H) 10/01/2019   BUN 24 10/01/2019   CO2 28 10/01/2019   TSH 2.596 03/10/2017  INR 1.1 01/26/2017   HGBA1C 5.2 03/09/2017      Assessment & Plan:  1. General medical exam -Recommend shingrix vaccine at pharmacy (2 series vaccine.)  - Start on aricept 5 mg once at night.  Continue namenda 10 mg twice daily.  2. Osteopenia of multiple sites. - DG Bone Density  3. Need for immunization against influenza - Flu Vaccine QUAD High Dose(Fluad)  4. Body mass index (BMI) 24.0-24.9, adult    Meds ordered this encounter  Medications  . donepezil (ARICEPT) 5 MG tablet    Sig: Take 1 tablet (5 mg total) by mouth at bedtime.    Dispense:  30 tablet    Refill:  0      This is a list of the screening recommended for you and due dates:  Health Maintenance  Topic Date Due  . DEXA scan (bone density measurement)  Never done  . Tetanus Vaccine  05/26/2021  . Flu Shot  Completed  . COVID-19 Vaccine  Completed  . Pneumonia vaccines  Completed     AN INDIVIDUALIZED CARE PLAN: was established or reinforced today.   SELF MANAGEMENT: The patient and I together assessed ways to personally work towards obtaining the recommended goals  Support needs The patient and/or family needs were assessed and services were offered and not necessary at this time.    Follow-up: 3-4 weeks [facility] [phone]

## 2020-03-13 ENCOUNTER — Telehealth: Payer: Self-pay

## 2020-03-13 NOTE — Chronic Care Management (AMB) (Signed)
    Chronic Care Management Pharmacy Assistant   Name: Sherry Hess  MRN: [id number] DOB: 10-02-1930  Reason for Encounter: Medication Review- Medication Adherence.    PCP : [name], MD  Allergies:   Allergies  Allergen Reactions  . Minoxidil     rash  . Norvasc [Amlodipine Besylate]     rash  . Clonidine Derivatives Rash    [name]    Medications: Outpatient Encounter Medications as of 03/13/2020  Medication Sig  . atorvastatin (LIPITOR) 20 MG tablet TAKE 1 TABLET BY MOUTH DAILY  . Calcium Citrate (CAL-CITRATE PO) Take by mouth daily.  . carvedilol (COREG) 25 MG tablet Take 25 mg by mouth 2 (two) times daily with a meal.  . diltiazem (CARDIZEM CD) 180 MG 24 hr capsule TAKE 1 CAPSULE BY MOUTH DAILY  . donepezil (ARICEPT) 5 MG tablet Take 1 tablet (5 mg total) by mouth at bedtime.  Marland Kitchen ELIQUIS 2.5 MG TABS tablet TAKE 1 TABLET BY MOUTH TWICE DAILY  . Ferrous Sulfate (CVS SLOW RELEASE IRON PO) Take 1 tablet daily by mouth.  . Fish Oil-Cholecalciferol (FISH OIL + D3) 1000-1000 MG-UNIT CAPS Take by mouth.  Marland Kitchen FLUoxetine (PROZAC) 10 MG capsule TAKE 1 CAPSULE BY MOUTH ONCE DAILY  . hydrALAZINE (APRESOLINE) 50 MG tablet TAKE 1 TABLET BY MOUTH 2 TIMES DAILY  . levothyroxine (SYNTHROID) 25 MCG tablet TAKE 1 TABLET BY MOUTH ONCE DAILY ON AN EMPTY STOMACH 30 MINUTES BEFORE BREAKFAST  . memantine (NAMENDA) 10 MG tablet TAKE 1 TABLET BY MOUTH 2 TIMES DAILY  . Multiple Vitamin (MULTIVITAMIN) tablet Take 1 tablet by mouth daily.  Marland Kitchen omeprazole (PRILOSEC) 20 MG capsule TAKE 1 CAPSULE BY MOUTH DAILY  . sodium chloride 1 g tablet Take 1 g by mouth daily.   No facility-administered encounter medications on file as of 03/13/2020.    Current Diagnosis: Patient Active Problem List   Diagnosis Date Noted  . Hypertension, renal disease, stage 1-4 or unspecified chronic kidney disease 07/02/2019  . Atrial fibrillation ([place]) 07/02/2019  . CKD (chronic kidney disease) stage 4,  GFR 15-29 ml/min (HCC) 07/02/2019  . Vascular dementia without behavioral disturbance ([place]) 07/02/2019  . Benign neoplasm of sigmoid colon   . Benign neoplasm of rectum   . Mixed hyperlipidemia 03/09/2017  . Occult blood positive stool 03/09/2017  . Dyspepsia 03/09/2017  . Normocytic anemia   . CVA (cerebral vascular accident) ([place]) 03/08/2017  . Paroxysmal atrial fibrillation ([place]) 02/23/2017  . Syncope 12/10/2016  . Fatigue 12/10/2016  . Hx of transient ischemic attack (TIA) 10/18/2016  . Chest pain in adult 12/27/2015  . Essential hypertension 11/20/2014      Follow-Up:  Pharmacist Review Reviewed chart and adherence measures. Per insurance data, medication adherence for cholesterol (statins) is 100% medication adherence.  Elly Modena Brown. CPP. Notified  Judithann Sheen, [place] Pharmacist Assistant [phone]

## 2020-03-24 DIAGNOSIS — N959 Unspecified menopausal and perimenopausal disorder: Secondary | ICD-10-CM | POA: Diagnosis not present

## 2020-03-24 DIAGNOSIS — M8589 Other specified disorders of bone density and structure, multiple sites: Secondary | ICD-10-CM | POA: Diagnosis not present

## 2020-03-24 DIAGNOSIS — M810 Age-related osteoporosis without current pathological fracture: Secondary | ICD-10-CM | POA: Diagnosis not present

## 2020-03-31 ENCOUNTER — Other Ambulatory Visit: Payer: Self-pay | Admitting: Family Medicine

## 2020-03-31 ENCOUNTER — Telehealth: Payer: PPO

## 2020-03-31 DIAGNOSIS — I129 Hypertensive chronic kidney disease with stage 1 through stage 4 chronic kidney disease, or unspecified chronic kidney disease: Secondary | ICD-10-CM

## 2020-04-02 ENCOUNTER — Ambulatory Visit (INDEPENDENT_AMBULATORY_CARE_PROVIDER_SITE_OTHER): Payer: PPO | Admitting: Family Medicine

## 2020-04-02 ENCOUNTER — Other Ambulatory Visit: Payer: Self-pay

## 2020-04-02 ENCOUNTER — Encounter: Payer: Self-pay | Admitting: Family Medicine

## 2020-04-02 VITALS — BP 128/58 | HR 56 | Temp 97.4°F | Ht 64.0 in | Wt 143.4 lb

## 2020-04-02 DIAGNOSIS — Z8673 Personal history of transient ischemic attack (TIA), and cerebral infarction without residual deficits: Secondary | ICD-10-CM | POA: Diagnosis not present

## 2020-04-02 DIAGNOSIS — I480 Paroxysmal atrial fibrillation: Secondary | ICD-10-CM

## 2020-04-02 DIAGNOSIS — I482 Chronic atrial fibrillation, unspecified: Secondary | ICD-10-CM

## 2020-04-02 DIAGNOSIS — N184 Chronic kidney disease, stage 4 (severe): Secondary | ICD-10-CM | POA: Diagnosis not present

## 2020-04-02 DIAGNOSIS — E039 Hypothyroidism, unspecified: Secondary | ICD-10-CM | POA: Diagnosis not present

## 2020-04-02 DIAGNOSIS — M816 Localized osteoporosis [Lequesne]: Secondary | ICD-10-CM | POA: Diagnosis not present

## 2020-04-02 DIAGNOSIS — I129 Hypertensive chronic kidney disease with stage 1 through stage 4 chronic kidney disease, or unspecified chronic kidney disease: Secondary | ICD-10-CM | POA: Diagnosis not present

## 2020-04-02 DIAGNOSIS — E782 Mixed hyperlipidemia: Secondary | ICD-10-CM

## 2020-04-02 DIAGNOSIS — F0150 Vascular dementia without behavioral disturbance: Secondary | ICD-10-CM | POA: Diagnosis not present

## 2020-04-02 NOTE — Patient Instructions (Signed)
Osteoporosis  Osteoporosis happens when your bones get thin and weak. This can cause your bones to break (fracture) more easily. You can do things at home to make your bones stronger. Follow these instructions at home:  Activity  Exercise as told by your doctor. Ask your doctor what activities are safe for you. You should do: ? Exercises that make your muscles work to hold your body weight up (weight-bearing exercises). These include tai chi, yoga, and walking. ? Exercises to make your muscles stronger. One example is lifting weights. Lifestyle  Limit alcohol intake to no more than 1 drink a day for nonpregnant women and 2 drinks a day for men. One drink equals 12 oz of beer, 5 oz of wine, or 1 oz of hard liquor.  Do not use any products that have nicotine or tobacco in them. These include cigarettes and e-cigarettes. If you need help quitting, ask your doctor. Preventing falls  Use tools to help you move around (mobility aids) as needed. These include canes, walkers, scooters, and crutches.  Keep rooms well-lit and free of clutter.  Put away things that could make you trip. These include cords and rugs.  Install safety rails on stairs. Install grab bars in bathrooms.  Use rubber mats in slippery areas, like bathrooms.  Wear shoes that: ? Fit you well. ? Support your feet. ? Have closed toes. ? Have rubber soles or low heels.  Tell your doctor about all of the medicines you are taking. Some medicines can make you more likely to fall. General instructions  Eat plenty of calcium and vitamin D. These nutrients are good for your bones. Good sources of calcium and vitamin D include: ? Some fatty fish, such as salmon and tuna. ? Foods that have calcium and vitamin D added to them (fortified foods). For example, some breakfast cereals are fortified with calcium and vitamin D. ? Egg yolks. ? Cheese. ? Liver.  Take over-the-counter and prescription medicines only as told by your  doctor.  Keep all follow-up visits as told by your doctor. This is important. Contact a doctor if:  You have not been tested (screened) for osteoporosis and you are: ? A woman who is age 65 or older. ? A man who is age 70 or older. Get help right away if:  You fall.  You get hurt. Summary  Osteoporosis happens when your bones get thin and weak.  Weak bones can break (fracture) more easily.  Eat plenty of calcium and vitamin D. These nutrients are good for your bones.  Tell your doctor about all of the medicines that you take. This information is not intended to replace advice given to you by your health care provider. Make sure you discuss any questions you have with your health care provider. Document Revised: 04/15/2017 Document Reviewed: 02/25/2017 Elsevier Patient Education  2020 Elsevier Inc.  

## 2020-04-02 NOTE — Progress Notes (Signed)
Subjective:  Patient ID: Sherry Hess, female    DOB: May 22, 1930  Age: 86 y.o. MRN: [id number]  Chief Complaint  Patient presents with  . Atrial Fibrillation  . Chronic Kidney Disease    HPI Sherry Hess presents for follow-up of atrial fibrillation, chronic kidney disease, hyperlipidemia, hypertension, and dementia.  [name] here with her son, Sherry Hess.  Patient eats healthy.  She does not exercise.  She lives with her son who is currently not with her today.  He manages her medications.  Both sons take very good care of their mother.  Hyperlipidemia: Patient is currently on Lipitor 20 mg once daily, OTC fish oil 1 capsule daily.  Hypertensive with chronic kidney disease stage IV: Only taking carvedilol 25 mg twice daily, Cardizem CD 180 mg once daily, hydralazine 50 mg twice daily.  Atrial fibrillation: Patient has been well controlled on carvedilol, Cardizem CD, and Eliquis 2.5 mg twice daily.  Patient sees Dr. Kirkland Hun for cardiology.  For hypothyroidism: Patient is currently on Synthroid 25 mcg once daily.  Vascular dementia: Patient also has a history of a stroke and a TIA.  At the time of both of these she had slurring of her speech.  Currently on Namenda 10 mg twice daily and Aricept 5 mg once daily at bedtime.  She also is on Eliquis and Lipitor.  Osteoporosis: I reviewed her bone density test with she and her son.  [name] currently not on any medication for osteoporosis and she cannot recall being on anything in the past.  However upon review of her last for bone density starting in early 2000's it appears that her bone density improved initially and then started worsening again in around 2015.  I wonder if she was possibly not on a bisphosphonate.  She started with our office in 2018.  She currently takes calcium citrate once daily.  GAD - pt is currently on prozac. Denies depression or anxiety.   Current Outpatient Medications on File Prior to Visit  Medication  Sig Dispense Refill  . atorvastatin (LIPITOR) 20 MG tablet TAKE 1 TABLET BY MOUTH DAILY 90 tablet 0  . Calcium Citrate (CAL-CITRATE PO) Take by mouth daily.    . carvedilol (COREG) 25 MG tablet Take 25 mg by mouth 2 (two) times daily with a meal.    . diltiazem (CARDIZEM CD) 180 MG 24 hr capsule TAKE 1 CAPSULE BY MOUTH DAILY 90 capsule 2  . donepezil (ARICEPT) 5 MG tablet Take 1 tablet (5 mg total) by mouth at bedtime. 30 tablet 0  . ELIQUIS 2.5 MG TABS tablet TAKE 1 TABLET BY MOUTH TWICE DAILY 180 tablet 1  . Ferrous Sulfate ([employer] SLOW RELEASE IRON PO) Take 1 tablet daily by mouth.    . Fish Oil-Cholecalciferol (FISH OIL + D3) 1000-1000 MG-UNIT CAPS Take by mouth.    Marland Kitchen FLUoxetine (PROZAC) 10 MG capsule TAKE 1 CAPSULE BY MOUTH ONCE DAILY 90 capsule 1  . hydrALAZINE (APRESOLINE) 50 MG tablet TAKE 1 TABLET BY MOUTH 2 TIMES DAILY 180 tablet 0  . levothyroxine (SYNTHROID) 25 MCG tablet TAKE 1 TABLET BY MOUTH ONCE DAILY ON AN EMPTY STOMACH 30 MINUTES BEFORE BREAKFAST 90 tablet 0  . memantine (NAMENDA) 10 MG tablet TAKE 1 TABLET BY MOUTH 2 TIMES DAILY 180 tablet 1  . Multiple Vitamin (MULTIVITAMIN) tablet Take 1 tablet by mouth daily.    Marland Kitchen omeprazole (PRILOSEC) 20 MG capsule TAKE 1 CAPSULE BY MOUTH DAILY 90 capsule 3  . sodium  chloride 1 g tablet Take 1 g by mouth daily.     No current facility-administered medications on file prior to visit.   Past Medical History:  Diagnosis Date  . Anxiety   . Benign neoplasm of rectum   . Benign neoplasm of sigmoid colon   . Chest pain in adult 12/27/2015  . CVA (cerebral vascular accident) ([place]) 03/08/2017  . Dyspepsia 03/09/2017  . Essential hypertension 11/20/2014  . Fatigue 12/10/2016  . High cholesterol   . HLD (hyperlipidemia) 03/09/2017  . Hx of transient ischemic attack (TIA) 10/18/2016  . Hypertension   . Normocytic anemia   . Occult blood positive stool 03/09/2017  . Paroxysmal atrial fibrillation ([place]) 02/23/2017  . Syncope 12/10/2016  . TIA  (transient ischemic attack)    Past Surgical History:  Procedure Laterality Date  . COLONOSCOPY N/A 03/11/2017   Procedure: COLONOSCOPY;  Surgeon: Gatha Mayer, MD;  Location: [facility] ENDOSCOPY;  Service: Endoscopy;  Laterality: N/A;  . ESOPHAGOGASTRODUODENOSCOPY N/A 03/11/2017   Procedure: ESOPHAGOGASTRODUODENOSCOPY (EGD);  Surgeon: Gatha Mayer, MD;  Location: [facility] ENDOSCOPY;  Service: Endoscopy;  Laterality: N/A;  . TONSILLECTOMY    . VESICOVAGINAL FISTULA CLOSURE W/ TAH      Family History  Problem Relation Age of Onset  . Cancer Father        lung  . Stroke Mother   . Cancer Brother        x2 bladder  . Dementia Brother   . Atrial fibrillation Son   . Ataxia Neg Hx   . Chorea Neg Hx   . Mental retardation Neg Hx   . Migraines Neg Hx   . Multiple sclerosis Neg Hx   . Neurofibromatosis Neg Hx   . Neuropathy Neg Hx   . Parkinsonism Neg Hx   . Seizures Neg Hx    Social History   Socioeconomic History  . Marital status: Widowed    Spouse name: Not on file  . Number of children: Not on file  . Years of education: Not on file  . Highest education level: Not on file  Occupational History  . Not on file  Tobacco Use  . Smoking status: Never Smoker  . Smokeless tobacco: Never Used  Vaping Use  . Vaping Use: Never used  Substance and Sexual Activity  . Alcohol use: No  . Drug use: No  . Sexual activity: Not on file  Other Topics Concern  . Not on file  Social History Narrative  . Not on file   Social Determinants of Health   Financial Resource Strain:   . Difficulty of Paying Living Expenses: Not on file  Food Insecurity: No Food Insecurity  . Worried About Charity fundraiser in the Last Year: Never true  . Ran Out of Food in the Last Year: Never true  Transportation Needs: No Transportation Needs  . Lack of Transportation (Medical): No  . Lack of Transportation (Non-Medical): No  Physical Activity:   . Days of Exercise per Week: Not on file  . Minutes of  Exercise per Session: Not on file  Stress:   . Feeling of Stress : Not on file  Social Connections:   . Frequency of Communication with Friends and Family: Not on file  . Frequency of Social Gatherings with Friends and Family: Not on file  . Attends Religious Services: Not on file  . Active Member of Clubs or Organizations: Not on file  . Attends Archivist Meetings: Not on file  .  Marital Status: Not on file    Review of Systems  Constitutional: Negative for chills, fatigue and fever.  HENT: Negative for congestion, ear pain and sore throat.   Respiratory: Negative for cough and shortness of breath.   Cardiovascular: Negative for chest pain and palpitations.  Gastrointestinal: Negative for abdominal pain, constipation, diarrhea, nausea and vomiting.  Genitourinary: Negative for dysuria and frequency.  Musculoskeletal: Negative for arthralgias, back pain and myalgias.  Neurological: Negative for dizziness, light-headedness and headaches.  Psychiatric/Behavioral: Negative for dysphoric mood. The patient is not nervous/anxious.      Objective:  BP (!) 128/58   Pulse (!) 56   Temp (!) 97.4 F (36.3 C)   Ht 5\' 4"  (1.626 m)   Wt 143 lb 6.4 oz (65 kg)   BMI 24.61 kg/m   BP/Weight 04/02/2020 03/11/2020 2/99/2426  Systolic BP 834 196 222  Diastolic BP 58 62 66  Wt. (Lbs) 143.4 143 144.2  BMI 24.61 24.55 25.14    Physical Exam Vitals reviewed.  Constitutional:      Appearance: Normal appearance. [name] normal weight.  Neck:     Vascular: No carotid bruit.  Cardiovascular:     Rate and Rhythm: Normal rate and regular rhythm.     Pulses: Normal pulses.     Heart sounds: Normal heart sounds.  Pulmonary:     Effort: Pulmonary effort is normal. No respiratory distress.     Breath sounds: Normal breath sounds.  Abdominal:     General: Abdomen is flat. Bowel sounds are normal.     Palpations: Abdomen is soft.     Tenderness: There is no abdominal tenderness.    Neurological:     Mental Status: [name] alert.  Psychiatric:        Mood and Affect: Mood normal.        Behavior: Behavior normal.    Lab Results  Component Value Date   WBC 3.2 (L) 10/01/2019   HGB 14.5 10/01/2019   HCT 44.7 10/01/2019   PLT 181 10/01/2019   GLUCOSE 85 10/01/2019   CHOL 113 10/01/2019   TRIG 92 10/01/2019   HDL 31 (L) 10/01/2019   LDLCALC 64 10/01/2019   ALT 7 10/01/2019   AST 23 10/01/2019   NA 141 10/01/2019   K 4.0 10/01/2019   CL 100 10/01/2019   CREATININE 1.82 (H) 10/01/2019   BUN 24 10/01/2019   CO2 28 10/01/2019   TSH 2.596 03/10/2017   INR 1.1 01/26/2017   HGBA1C 5.2 03/09/2017   Assessment & Plan:   1. Localized osteoporosis without current pathological fracture Recommend start on prolia weekly. We will work on getting approval. - VITAMIN D 25 Hydroxy (Vit-D Deficiency, Fractures)  2. Hypertensive kidney disease with chronic kidney disease stage IV (HCC) Check labs.  Has been stable.  - CBC with Differential/Platelet - Comprehensive metabolic panel - TSH  3. Mixed hyperlipidemia Well controlled.  No changes to medicines.  Continue to work on eating a healthy diet and exercise.  Labs drawn today.  - Lipid panel  4. CKD (chronic kidney disease) stage 4, GFR 15-29 ml/min (HCC) Pt is not seeing a nephrologist. Consider referral. - CMP  5. Chronic atrial fibrillation (HCC) The current medical regimen is effective;  continue present plan and medications. Management per specialist.   6. Vascular dementia without behavioral disturbance ([place]) The current medical regimen is effective;  continue present plan and medications. No recurrent tias/strokes.  7. Paroxysmal atrial fibrillation (HCC) The current medical  regimen is effective;  continue present plan and medications.  8. History of stroke The current medical regimen is effective;  continue present plan and medications.  9. Acquired hypothyroidism The current medical regimen  is effective;  continue present plan and medications. -TSH.  Orders Placed This Encounter  Procedures  . CBC with Differential/Platelet  . Comprehensive metabolic panel  . Lipid panel  . TSH  . VITAMIN D 25 Hydroxy (Vit-D Deficiency, Fractures)     I spent 30 minutes dedicated to the care of this patient on the date of this encounter to include face-to-face time with the patient, as well as: Preparing to see the patient (review of tests/records). Obtaining and/or reviewing separately obtained history. Performing a medically appropriate examination and/or evaluation. Counseling and educating the patient/family/caregiver. Ordering medications - prolia Documenting clinical information in the electronic or other health record. Independently interpreting results (bone density) and communicating results to the patient and her son.  My nursing staff have aided in the documentation of this note on the behalf of [name], MD,as directed by  [name], MD and thoroughly reviewed by [name], MD.  Follow-up: Return in about 6 months (around 09/30/2020) for fasting.  An After Visit Summary was printed and given to the patient.  [name], MD [facility] [phone]

## 2020-04-03 LAB — CBC WITH DIFFERENTIAL/PLATELET
Basophils Absolute: 0 10*3/uL (ref 0.0–0.2)
Basos: 0 %
EOS (ABSOLUTE): 0 10*3/uL (ref 0.0–0.4)
Eos: 0 %
Hematocrit: 38.9 % (ref 34.0–46.6)
Hemoglobin: 12.7 g/dL (ref 11.1–15.9)
Immature Grans (Abs): 0 10*3/uL (ref 0.0–0.1)
Immature Granulocytes: 0 %
Lymphocytes Absolute: 0.6 10*3/uL — ABNORMAL LOW (ref 0.7–3.1)
Lymphs: 25 %
MCH: 29.3 pg (ref 26.6–33.0)
MCHC: 32.6 g/dL (ref 31.5–35.7)
MCV: 90 fL (ref 79–97)
Monocytes Absolute: 0.5 10*3/uL (ref 0.1–0.9)
Monocytes: 20 %
Neutrophils Absolute: 1.4 10*3/uL (ref 1.4–7.0)
Neutrophils: 55 %
Platelets: 159 10*3/uL (ref 150–450)
RBC: 4.34 x10E6/uL (ref 3.77–5.28)
RDW: 14 % (ref 11.7–15.4)
WBC: 2.6 10*3/uL — ABNORMAL LOW (ref 3.4–10.8)

## 2020-04-03 LAB — COMPREHENSIVE METABOLIC PANEL
ALT: 7 IU/L (ref 0–32)
AST: 17 IU/L (ref 0–40)
Albumin/Globulin Ratio: 1.6 (ref 1.2–2.2)
Albumin: 4 g/dL (ref 3.6–4.6)
Alkaline Phosphatase: 82 IU/L (ref 44–121)
BUN/Creatinine Ratio: 10 — ABNORMAL LOW (ref 12–28)
BUN: 17 mg/dL (ref 8–27)
Bilirubin Total: 0.8 mg/dL (ref 0.0–1.2)
CO2: 25 mmol/L (ref 20–29)
Calcium: 9.4 mg/dL (ref 8.7–10.3)
Chloride: 100 mmol/L (ref 96–106)
Creatinine, Ser: 1.71 mg/dL — ABNORMAL HIGH (ref 0.57–1.00)
GFR calc Af Amer: 31 mL/min/{1.73_m2} — ABNORMAL LOW (ref >59)
GFR calc non Af Amer: 27 mL/min/{1.73_m2} — ABNORMAL LOW (ref >59)
Globulin, Total: 2.5 g/dL (ref 1.5–4.5)
Glucose: 85 mg/dL (ref 65–99)
Potassium: 4.1 mmol/L (ref 3.5–5.2)
Sodium: 141 mmol/L (ref 134–144)
Total Protein: 6.5 g/dL (ref 6.0–8.5)

## 2020-04-03 LAB — LIPID PANEL
Chol/HDL Ratio: 3.6 ratio (ref 0.0–4.4)
Cholesterol, Total: 108 mg/dL (ref 100–199)
HDL: 30 mg/dL — ABNORMAL LOW (ref >39)
LDL Chol Calc (NIH): 61 mg/dL (ref 0–99)
Triglycerides: 87 mg/dL (ref 0–149)
VLDL Cholesterol Cal: 17 mg/dL (ref 5–40)

## 2020-04-03 LAB — CARDIOVASCULAR RISK ASSESSMENT

## 2020-04-03 LAB — TSH: TSH: 2.97 u[IU]/mL (ref 0.450–4.500)

## 2020-04-03 LAB — VITAMIN D 25 HYDROXY (VIT D DEFICIENCY, FRACTURES): Vit D, 25-Hydroxy: 24.5 ng/mL — ABNORMAL LOW (ref 30.0–100.0)

## 2020-04-04 ENCOUNTER — Telehealth: Payer: Self-pay

## 2020-04-04 ENCOUNTER — Other Ambulatory Visit: Payer: Self-pay

## 2020-04-04 DIAGNOSIS — R799 Abnormal finding of blood chemistry, unspecified: Secondary | ICD-10-CM

## 2020-04-04 DIAGNOSIS — M816 Localized osteoporosis [Lequesne]: Secondary | ICD-10-CM

## 2020-04-04 MED ORDER — VITAMIN D (ERGOCALCIFEROL) 1.25 MG (50000 UNIT) PO CAPS: 50000.0000 [IU] | ORAL_CAPSULE | ORAL | 2 refills | Status: DC

## 2020-04-04 NOTE — Chronic Care Management (AMB) (Signed)
Dr. Tobie Poet recommending patient begin Prolia. Pharmacist submitting verification of benefits for Prolia through [employer] portal. Will contact patient's son once cost determined.   Sherre Poot, PharmD, [facility] Clinical Pharmacist [facility] [phone] (office) [phone] (mobile)

## 2020-04-07 ENCOUNTER — Other Ambulatory Visit: Payer: Self-pay | Admitting: Family Medicine

## 2020-04-07 ENCOUNTER — Other Ambulatory Visit: Payer: Self-pay

## 2020-04-07 ENCOUNTER — Ambulatory Visit: Payer: PPO

## 2020-04-07 ENCOUNTER — Other Ambulatory Visit: Payer: Self-pay | Admitting: Physician Assistant

## 2020-04-07 DIAGNOSIS — E782 Mixed hyperlipidemia: Secondary | ICD-10-CM

## 2020-04-07 DIAGNOSIS — M816 Localized osteoporosis [Lequesne]: Secondary | ICD-10-CM

## 2020-04-07 MED ORDER — VITAMIN D (ERGOCALCIFEROL) 1.25 MG (50000 UNIT) PO CAPS: 50000.0000 [IU] | ORAL_CAPSULE | ORAL | 0 refills | Status: DC

## 2020-04-07 NOTE — Patient Instructions (Signed)
Visit Information  Goals Addressed            This [place] (see longitudinal plan of care for additional care plan information)  Current Barriers:   Uncontrolled hyperlipidemia, complicated by age and HTN  Current antihyperlipidemic regimen: atorvastatin  Previous antihyperlipidemic medications tried none noted  Most recent lipid panel:     Component Value Date/Time   CHOL 108 04/02/2020 1031   TRIG 87 04/02/2020 1031   HDL 30 (L) 04/02/2020 1031   CHOLHDL 3.6 04/02/2020 1031   CHOLHDL 3.3 03/09/2017 0226   VLDL 10 03/09/2017 0226   LDLCALC 61 04/02/2020 1031    ASCVD risk enhancing conditions: age >26, DM, HTN, CKD, CHF, current smoker  Pharmacist Clinical Goal(s):   Over the next 90 days, patient will work with PharmD and providers towards optimized antihyperlipidemic therapy  Interventions:  Comprehensive medication review performed; medication list updated in electronic medical record.   Inter-disciplinary care team collaboration (see longitudinal plan of care)  Ensure safety, efficacy, and affordability of medications   Patient Self Care Activities:   Patient will focus on medication adherence by continuing to use weekly medication organizer.   Patient will continue to eat a heart healthy meal of lean protein and vegetables daily.   Please see past updates related to this goal by clicking on the "Past Updates" button in the selected goal       Pharmacy Care Plan HTN       CARE PLAN ENTRY (see longitudinal plan of care for additional care plan information)  Current Barriers:   Uncontrolled hypertension, complicated by age  Current antihypertensive regimen: diltiazem, hydralazine, carvedilol  Previous antihypertensives tried: n/a  Last practice recorded BP readings:  BP Readings from Last 3 Encounters:  04/02/20 (!) 128/58  03/11/20 136/62  12/03/19 124/66     Current home BP  readings: 144-151/63-69 mmHg  Most recent eGFR/CrCl: No results found for: EGFR  No components found for: CRCL  Pharmacist Clinical Goal(s):   Over the next 30 days, patient will work with PharmD and providers to optimize antihypertensive regimen  Ensure safety, efficacy, and affordability of medications  Interventions:  Inter-disciplinary care team collaboration (see longitudinal plan of care)  Comprehensive medication review performed; medication list updated in the electronic medical record.   Patient Self Care Activities:   Patient will continue to check BP weekly , document, and provide at future appointments  Patient will focus on medication adherence by continuing to use weekly pill orgainzer for optimal adherence.  Please see past updates related to this goal by clicking on the "Past Updates" button in the selected goal         The patient verbalized understanding of instructions, educational materials, and care plan provided today and declined offer to receive copy of patient instructions, educational materials, and care plan.   Telephone follow up appointment with pharmacy team member scheduled for: 09/2020  Sherre Poot, PharmD, [facility] Clinical Pharmacist [facility] [phone] (office) [phone] (mobile)  Denosumab injection What is this medicine? DENOSUMAB (den oh sue mab) slows bone breakdown. Prolia is used to treat osteoporosis in women after menopause and in men, and in people who are taking corticosteroids for 6 months or more. Delton See is used to treat a high calcium level due to cancer and to prevent bone fractures and other bone problems caused by multiple myeloma or cancer bone metastases. Delton See  is also used to treat giant cell tumor of the bone. This medicine may be used for other purposes; ask your health care provider or pharmacist if you have questions. COMMON BRAND NAME(S): Prolia, XGEVA What should I tell my health care provider  before I take this medicine? They need to know if you have any of these conditions:  dental disease  having surgery or tooth extraction  infection  kidney disease  low levels of calcium or Vitamin D in the blood  malnutrition  on hemodialysis  skin conditions or sensitivity  thyroid or parathyroid disease  an unusual reaction to denosumab, other medicines, foods, dyes, or preservatives  pregnant or trying to get pregnant  breast-feeding How should I use this medicine? This medicine is for injection under the skin. It is given by a health care professional in a hospital or clinic setting. A special MedGuide will be given to you before each treatment. Be sure to read this information carefully each time. For Prolia, talk to your pediatrician regarding the use of this medicine in children. Special care may be needed. For Delton See, talk to your pediatrician regarding the use of this medicine in children. While this drug may be prescribed for children as young as 13 years for selected conditions, precautions do apply. Overdosage: If you think you have taken too much of this medicine contact a poison control center or emergency room at once. NOTE: This medicine is only for you. Do not share this medicine with others. What if I miss a dose? It is important not to miss your dose. Call your doctor or health care professional if you are unable to keep an appointment. What may interact with this medicine? Do not take this medicine with any of the following medications:  other medicines containing denosumab This medicine may also interact with the following medications:  medicines that lower your chance of fighting infection  steroid medicines like prednisone or cortisone This list may not describe all possible interactions. Give your health care provider a list of all the medicines, herbs, non-prescription drugs, or dietary supplements you use. Also tell them if you smoke, drink alcohol,  or use illegal drugs. Some items may interact with your medicine. What should I watch for while using this medicine? Visit your doctor or health care professional for regular checks on your progress. Your doctor or health care professional may order blood tests and other tests to see how you are doing. Call your doctor or health care professional for advice if you get a fever, chills or sore throat, or other symptoms of a cold or flu. Do not treat yourself. This drug may decrease your body's ability to fight infection. Try to avoid being around people who are sick. You should make sure you get enough calcium and vitamin D while you are taking this medicine, unless your doctor tells you not to. Discuss the foods you eat and the vitamins you take with your health care professional. See your dentist regularly. Brush and floss your teeth as directed. Before you have any dental work done, tell your dentist you are receiving this medicine. Do not become pregnant while taking this medicine or for 5 months after stopping it. Talk with your doctor or health care professional about your birth control options while taking this medicine. Women should inform their doctor if they wish to become pregnant or think they might be pregnant. There is a potential for serious side effects to an unborn child. Talk to your health  care professional or pharmacist for more information. What side effects may I notice from receiving this medicine? Side effects that you should report to your doctor or health care professional as soon as possible:  allergic reactions like skin rash, itching or hives, swelling of the face, lips, or tongue  bone pain  breathing problems  dizziness  jaw pain, especially after dental work  redness, blistering, peeling of the skin  signs and symptoms of infection like fever or chills; cough; sore throat; pain or trouble passing urine  signs of low calcium like fast heartbeat, muscle cramps or  muscle pain; pain, tingling, numbness in the hands or feet; seizures  unusual bleeding or bruising  unusually weak or tired Side effects that usually do not require medical attention (report to your doctor or health care professional if they continue or are bothersome):  constipation  diarrhea  headache  joint pain  loss of appetite  muscle pain  runny nose  tiredness  upset stomach This list may not describe all possible side effects. Call your doctor for medical advice about side effects. You may report side effects to FDA at 1-800-FDA-1088. Where should I keep my medicine? This medicine is only given in a clinic, doctor's office, or other health care setting and will not be stored at home. NOTE: This sheet is a summary. It may not cover all possible information. If you have questions about this medicine, talk to your doctor, pharmacist, or health care provider.  2020 Elsevier/Gold Standard (2017-09-09 16:10:44)

## 2020-04-07 NOTE — Chronic Care Management (AMB) (Signed)
Chronic Care Management Pharmacy  Name: Sherry Hess  MRN: [id number] DOB: 1931-01-27  Chief Complaint/ HPI  Sherry Hess,  86 y.o. , female presents for their Follow-Up CCM visit with the clinical pharmacist via telephone due to COVID-19 Pandemic.  PCP : [name], MD  Their chronic conditions include: Hypertension, Afib, CKD, Vascular Dementia, Dyspepsia, HLD, Anemia.   Office Visits: 04/02/2020 - labs ordered. Prolia recommended. Vitamin D 50,000 units weekly ordered. Prolia recommended.  03/11/2020 - start aricept 5 mg once daily at night. Continue Namenda bid. Ordered Dexa. Shingrix recommended. Flu shot administered.  Consult Visit: 06/06/2019 - Cardio visit with Dr. Raliegh Ip. No medicine changes. 06/01/2019 - Dr. Hollie Salk for CKD.  05/03/2019 - Dr. Bobby Rumpf for iron deficiency anemia.   Medications: Outpatient Encounter Medications as of 04/07/2020  Medication Sig  . atorvastatin (LIPITOR) 20 MG tablet TAKE 1 TABLET BY MOUTH DAILY  . Calcium Citrate (CAL-CITRATE PO) Take by mouth daily.  . carvedilol (COREG) 25 MG tablet Take 25 mg by mouth 2 (two) times daily with a meal.  . diltiazem (CARDIZEM CD) 180 MG 24 hr capsule TAKE 1 CAPSULE BY MOUTH DAILY  . donepezil (ARICEPT) 5 MG tablet Take 1 tablet (5 mg total) by mouth at bedtime.  Marland Kitchen ELIQUIS 2.5 MG TABS tablet TAKE 1 TABLET BY MOUTH TWICE DAILY  . Ferrous Sulfate ([employer] SLOW RELEASE IRON PO) Take 1 tablet daily by mouth.  . Fish Oil-Cholecalciferol (FISH OIL + D3) 1000-1000 MG-UNIT CAPS Take by mouth.  Marland Kitchen FLUoxetine (PROZAC) 10 MG capsule TAKE 1 CAPSULE BY MOUTH ONCE DAILY  . hydrALAZINE (APRESOLINE) 50 MG tablet TAKE 1 TABLET BY MOUTH 2 TIMES DAILY  . levothyroxine (SYNTHROID) 25 MCG tablet TAKE 1 TABLET BY MOUTH ONCE DAILY ON AN EMPTY STOMACH 30 MINUTES BEFORE BREAKFAST  . memantine (NAMENDA) 10 MG tablet TAKE 1 TABLET BY MOUTH 2 TIMES DAILY  . Multiple Vitamin (MULTIVITAMIN) tablet Take 1 tablet by mouth daily.  Marland Kitchen  omeprazole (PRILOSEC) 20 MG capsule TAKE 1 CAPSULE BY MOUTH DAILY  . sodium chloride 1 g tablet Take 1 g by mouth daily.  . Vitamin D, Ergocalciferol, (DRISDOL) 1.25 MG (50000 UNIT) CAPS capsule Take 1 capsule (50,000 Units total) by mouth every 7 (seven) days.   No facility-administered encounter medications on file as of 04/07/2020.   Allergies  Allergen Reactions  . Minoxidil     rash  . Norvasc [Amlodipine Besylate]     rash  . Clonidine Derivatives Rash    [name]   SDOH Screenings   Alcohol Screen:   . Last Alcohol Screening Score (AUDIT): Not on file  Depression (PHQ2-9): Low Risk   . PHQ-2 Score: 0  Financial Resource Strain:   . Difficulty of Paying Living Expenses: Not on file  Food Insecurity: No Food Insecurity  . Worried About Charity fundraiser in the Last Year: Never true  . Ran Out of Food in the Last Year: Never true  Housing: Low Risk   . Last Housing Risk Score: 0  Physical Activity:   . Days of Exercise per Week: Not on file  . Minutes of Exercise per Session: Not on file  Social Connections:   . Frequency of Communication with Friends and Family: Not on file  . Frequency of Social Gatherings with Friends and Family: Not on file  . Attends Religious Services: Not on file  . Active Member of Clubs or Organizations: Not on file  . Attends Club  or Organization Meetings: Not on file  . Marital Status: Not on file  Stress:   . Feeling of Stress : Not on file  Tobacco Use: Low Risk   . Smoking Tobacco Use: Never Smoker  . Smokeless Tobacco Use: Never Used  Transportation Needs: No Transportation Needs  . Lack of Transportation (Medical): No  . Lack of Transportation (Non-Medical): No    Current Diagnosis/Assessment:  Goals Addressed            This Visit's Progress   . Pharmacy Care Plan HLD       CARE PLAN ENTRY (see longitudinal plan of care for additional care plan information)  Current Barriers:  . Uncontrolled  hyperlipidemia, complicated by age and HTN . Current antihyperlipidemic regimen: atorvastatin . Previous antihyperlipidemic medications tried none noted . Most recent lipid panel:     Component Value Date/Time   CHOL 108 04/02/2020 1031   TRIG 87 04/02/2020 1031   HDL 30 (L) 04/02/2020 1031   CHOLHDL 3.6 04/02/2020 1031   CHOLHDL 3.3 03/09/2017 0226   VLDL 10 03/09/2017 0226   LDLCALC 61 04/02/2020 1031   . ASCVD risk enhancing conditions: age >87, DM, HTN, CKD, CHF, current smoker  Pharmacist Clinical Goal(s):  Marland Kitchen Over the next 90 days, patient will work with PharmD and providers towards optimized antihyperlipidemic therapy  Interventions: . Comprehensive medication review performed; medication list updated in electronic medical record.  Bertram Savin care team collaboration (see longitudinal plan of care) . Ensure safety, efficacy, and affordability of medications   Patient Self Care Activities:  . Patient will focus on medication adherence by continuing to use weekly medication organizer.  . Patient will continue to eat a heart healthy meal of lean protein and vegetables daily.   Please see past updates related to this goal by clicking on the "Past Updates" button in the selected goal      . Pharmacy Care Plan HTN       CARE PLAN ENTRY (see longitudinal plan of care for additional care plan information)  Current Barriers:  . Uncontrolled hypertension, complicated by age . Current antihypertensive regimen: diltiazem, hydralazine, carvedilol . Previous antihypertensives tried: n/a . Last practice recorded BP readings:  BP Readings from Last 3 Encounters:  04/02/20 (!) 128/58  03/11/20 136/62  12/03/19 124/66 .   Marland Kitchen Current home BP readings: 144-151/63-69 mmHg . Most recent eGFR/CrCl: No results found for: EGFR  No components found for: CRCL  Pharmacist Clinical Goal(s):  Marland Kitchen Over the next 30 days, patient will work with PharmD and providers to optimize  antihypertensive regimen . Ensure safety, efficacy, and affordability of medications  Interventions: . Inter-disciplinary care team collaboration (see longitudinal plan of care) . Comprehensive medication review performed; medication list updated in the electronic medical record.   Patient Self Care Activities:  . Patient will continue to check BP weekly , document, and provide at future appointments . Patient will focus on medication adherence by continuing to use weekly pill orgainzer for optimal adherence.  Please see past updates related to this goal by clicking on the "Past Updates" button in the selected goal        Hyperlipidemia   LDL goal < 100  Last lipids Lab Results  Component Value Date   CHOL 108 04/02/2020   HDL 30 (L) 04/02/2020   LDLCALC 61 04/02/2020   TRIG 87 04/02/2020   CHOLHDL 3.6 04/02/2020   Hepatic Function Latest Ref Rng & Units 04/02/2020 10/01/2019 07/02/2019  Total Protein  6.0 - 8.5 g/dL 6.5 6.9 6.6  Albumin 3.6 - 4.6 g/dL 4.0 4.2 3.9  AST 0 - 40 IU/L '17 23 29  ' ALT 0 - 32 IU/L 7 7 -  Alk Phosphatase 44 - 121 IU/L 82 74 67  Total Bilirubin 0.0 - 1.2 mg/dL 0.8 0.5 0.5     The ASCVD Risk score Mikey Bussing DC Jr., et al., 2013) failed to calculate for the following reasons:   The 2013 ASCVD risk score is only valid for ages 57 to 13   The patient has a prior MI or stroke diagnosis   Patient has failed these meds in past: none recalled Patient is currently controlled on the following medications:  . Atorvastatin 20 mg daily   We discussed:  diet and exercise extensively. Patient advised to complete exercise with assistance to avoid falls. Encouraged continued healthy diet.   Plan  Continue current medications   Osteopenia / Osteoporosis   Last DEXA Scan: not scanned in yet worsened osteoporosis 2021   Vit D, 25-Hydroxy  Date Value Ref Range Status  04/02/2020 24.5 (L) 30.0 - 100.0 ng/mL Final    Comment:    Vitamin D deficiency has been  defined by the [place] practice guideline as a level of serum 25-OH vitamin D less than 20 ng/mL (1,2). The Endocrine Society went on to further define vitamin D insufficiency as a level between 21 and 29 ng/mL (2). 1. IOM (Institute of Medicine). 2010. Dietary reference    intakes for calcium and D. [place]: The    [employer]. 2. Holick MF, [place], Bischoff-Ferrari HA, et al.    Evaluation, treatment, and prevention of vitamin D    deficiency: an Endocrine Society clinical practice    guideline. JCEM. 2011 Jul; 96(7):1911-30.      Patient is a candidate for pharmacologic treatment due to T-Score < -2.5 in femoral neck  Patient has failed these meds in past: none recalled Patient is currently uncontrolled on the following medications:  Marland Kitchen Vitamin D 50,000 units weekly  . Calcium Citrate daily    We discussed:  Recommend [phone] units of vitamin D daily. Recommend 1200 mg of calcium daily from dietary and supplemental sources. Recommend weight-bearing and muscle strengthening exercises for building and maintaining bone density.   Determined cost of Prolia for patient. Patient's insurance states that patient's responsibility is 20% and prior authorization required. Patient's son will talk with his siblings to determine if they are in agreement to proceed with Prolia. Pharmacist will coordinate prior authorization and scheduling first dose for patient.   Patient's son is concerned about Vitamin D dosing. The lab result call indicated twice weekly but prescription bottle states once weekly. Pharmacist will find out from Dr. Tobie Poet and call son back.   Plan  Continue current medications. Consider Prolia.    Vaccines   Reviewed and discussed patient's vaccination history.    Immunization History  Administered Date(s) Administered  . Fluad Quad(high Dose 65+) 03/11/2020  . Influenza,inj,quad, With Preservative 02/09/2018    . Influenza-Unspecified 02/14/2018  . PFIZER SARS-COV-2 Vaccination 06/02/2019, 06/23/2019, 02/19/2020  . Pneumococcal Conjugate-13 01/04/2014  . Pneumococcal Polysaccharide-23 05/17/2010  . Tdap 05/27/2011   Plan  Recommended patient receive Shingrix vaccine.    Medication Management   Pt uses [facility] for all medications Uses weekly pill box Pt endorses 100% compliance  We discussed: Patient reports that her medications are working well for her and she takes  them as prescribed.   Plan  Continue current medication administration strategy.    Follow up: 6 months

## 2020-04-11 ENCOUNTER — Other Ambulatory Visit: Payer: Self-pay | Admitting: Family Medicine

## 2020-04-15 ENCOUNTER — Ambulatory Visit: Payer: Self-pay

## 2020-04-15 NOTE — Chronic Care Management (AMB) (Signed)
Spoke to patient's son to confirm twice weekly dosing of Vitamin D.   Coordinated Prolia prior authorization and scheduling Prolia shot in office.  Sherre Poot, PharmD, [facility] Clinical Pharmacist [employer] [phone] (office) [phone] (mobile)

## 2020-04-16 ENCOUNTER — Other Ambulatory Visit: Payer: Self-pay | Admitting: Family Medicine

## 2020-04-16 DIAGNOSIS — D72818 Other decreased white blood cell count: Secondary | ICD-10-CM

## 2020-04-16 DIAGNOSIS — E538 Deficiency of other specified B group vitamins: Secondary | ICD-10-CM

## 2020-04-18 ENCOUNTER — Ambulatory Visit: Payer: PPO

## 2020-04-18 DIAGNOSIS — H18513 Endothelial corneal dystrophy, bilateral: Secondary | ICD-10-CM | POA: Diagnosis not present

## 2020-04-18 DIAGNOSIS — H26491 Other secondary cataract, right eye: Secondary | ICD-10-CM | POA: Diagnosis not present

## 2020-04-18 DIAGNOSIS — Z961 Presence of intraocular lens: Secondary | ICD-10-CM | POA: Diagnosis not present

## 2020-04-18 DIAGNOSIS — H353110 Nonexudative age-related macular degeneration, right eye, stage unspecified: Secondary | ICD-10-CM | POA: Diagnosis not present

## 2020-04-18 DIAGNOSIS — H26493 Other secondary cataract, bilateral: Secondary | ICD-10-CM | POA: Diagnosis not present

## 2020-04-22 ENCOUNTER — Ambulatory Visit (INDEPENDENT_AMBULATORY_CARE_PROVIDER_SITE_OTHER): Payer: PPO

## 2020-04-22 ENCOUNTER — Other Ambulatory Visit: Payer: Self-pay

## 2020-04-22 DIAGNOSIS — M816 Localized osteoporosis [Lequesne]: Secondary | ICD-10-CM | POA: Diagnosis not present

## 2020-04-22 DIAGNOSIS — D72818 Other decreased white blood cell count: Secondary | ICD-10-CM

## 2020-04-22 DIAGNOSIS — E538 Deficiency of other specified B group vitamins: Secondary | ICD-10-CM

## 2020-04-22 MED ORDER — DENOSUMAB 60 MG/ML ~~LOC~~ SOSY
60.0000 mg | PREFILLED_SYRINGE | Freq: Once | SUBCUTANEOUS | Status: AC
  Administered 2020-04-22: 60 mg via SUBCUTANEOUS

## 2020-04-22 NOTE — Progress Notes (Signed)
   Prolia injection given per order, patient tolerated well.   Erie Noe, LPN 51:88 AM

## 2020-04-25 DIAGNOSIS — E871 Hypo-osmolality and hyponatremia: Secondary | ICD-10-CM | POA: Diagnosis not present

## 2020-04-25 DIAGNOSIS — I4891 Unspecified atrial fibrillation: Secondary | ICD-10-CM | POA: Diagnosis not present

## 2020-04-25 DIAGNOSIS — M810 Age-related osteoporosis without current pathological fracture: Secondary | ICD-10-CM | POA: Diagnosis not present

## 2020-04-25 DIAGNOSIS — I129 Hypertensive chronic kidney disease with stage 1 through stage 4 chronic kidney disease, or unspecified chronic kidney disease: Secondary | ICD-10-CM | POA: Diagnosis not present

## 2020-04-25 DIAGNOSIS — N1832 Chronic kidney disease, stage 3b: Secondary | ICD-10-CM | POA: Diagnosis not present

## 2020-04-25 LAB — CBC WITH DIFFERENTIAL/PLATELET
Basophils Absolute: 0 10*3/uL (ref 0.0–0.2)
Basos: 0 %
EOS (ABSOLUTE): 0 10*3/uL (ref 0.0–0.4)
Eos: 0 %
Hematocrit: 36.5 % (ref 34.0–46.6)
Hemoglobin: 12.4 g/dL (ref 11.1–15.9)
Immature Grans (Abs): 0 10*3/uL (ref 0.0–0.1)
Immature Granulocytes: 1 %
Lymphocytes Absolute: 0.6 10*3/uL — ABNORMAL LOW (ref 0.7–3.1)
Lymphs: 21 %
MCH: 29.9 pg (ref 26.6–33.0)
MCHC: 34 g/dL (ref 31.5–35.7)
MCV: 88 fL (ref 79–97)
Monocytes Absolute: 0.4 10*3/uL (ref 0.1–0.9)
Monocytes: 14 %
Neutrophils Absolute: 1.8 10*3/uL (ref 1.4–7.0)
Neutrophils: 64 %
Platelets: 207 10*3/uL (ref 150–450)
RBC: 4.15 x10E6/uL (ref 3.77–5.28)
RDW: 14.5 % (ref 11.7–15.4)
WBC: 2.9 10*3/uL — ABNORMAL LOW (ref 3.4–10.8)

## 2020-04-25 LAB — METHYLMALONIC ACID, SERUM: Methylmalonic Acid: 364 nmol/L (ref 0–378)

## 2020-04-25 LAB — B12 AND FOLATE PANEL
Folate: 9.4 ng/mL (ref >3.0)
Vitamin B-12: 436 pg/mL (ref 232–1245)

## 2020-05-02 ENCOUNTER — Other Ambulatory Visit: Payer: PPO

## 2020-05-12 ENCOUNTER — Other Ambulatory Visit: Payer: Self-pay | Admitting: Cardiology

## 2020-05-12 NOTE — Telephone Encounter (Signed)
Refill sent to pharmacy.   

## 2020-05-15 ENCOUNTER — Telehealth: Payer: Self-pay

## 2020-05-15 ENCOUNTER — Telehealth: Payer: Self-pay | Admitting: Family Medicine

## 2020-05-15 DIAGNOSIS — N319 Neuromuscular dysfunction of bladder, unspecified: Secondary | ICD-10-CM | POA: Diagnosis not present

## 2020-05-15 DIAGNOSIS — N3091 Cystitis, unspecified with hematuria: Secondary | ICD-10-CM | POA: Diagnosis not present

## 2020-05-15 NOTE — Telephone Encounter (Signed)
Spoke with patient daughter about making an appointment. None of the available times worked for patient she couldnt get here before 11am due to driving time. She also refused a telehealth call instead of in office visit. After speaking with a nurse they suggested to go to urgent care or ed. The daughter stated she wouldn't not take her mother to the ed for a 3 hour wait or to nasty germ infested urgent care.

## 2020-05-15 NOTE — Telephone Encounter (Signed)
Pts daughter called @1042  to speak with a nurse. States pt would like to be seen and bring UA. Daughter states pt is having loss of appetite, muscle weakness, and BP has been in 90s/50s but now normal. Daughter states loss of appetite always correlates with something wrong for her mother. States pt can barely get up and move around. CMA recommended she needs to be seen and go to ED due to BP being low and symptoms. Daughter not happy that we close early today and only appointment open was @ 1030 and pt could not get here in time. Daughter states she would not take her mother to urgent care or ED. "ED has 24 hour wait."

## 2020-05-20 ENCOUNTER — Telehealth: Payer: Self-pay

## 2020-05-20 NOTE — Telephone Encounter (Signed)
Jeannie Done called to report that her Mother was seen at the Urgent Care and treated for a UTI.  She was given IM Rocephin and an RX for Macrobid.  They received a call that they could stop the Macrobid since her urine culture came back negative.  [name] feeling better and she is uncertain about stopping the medication.  [name] going to call the nephrologist and see if she can continue the medication.  She reports that her Mother is doing muc better.  Her appetite is improving and she has less fatigue.

## 2020-05-29 ENCOUNTER — Other Ambulatory Visit: Payer: Medicare Other

## 2020-05-29 ENCOUNTER — Other Ambulatory Visit: Payer: Self-pay

## 2020-05-29 DIAGNOSIS — N184 Chronic kidney disease, stage 4 (severe): Secondary | ICD-10-CM | POA: Diagnosis not present

## 2020-05-29 DIAGNOSIS — N3000 Acute cystitis without hematuria: Secondary | ICD-10-CM

## 2020-05-30 LAB — URINALYSIS, ROUTINE W REFLEX MICROSCOPIC
Bilirubin, UA: NEGATIVE
Glucose, UA: NEGATIVE
Ketones, UA: NEGATIVE
Nitrite, UA: NEGATIVE
Protein,UA: NEGATIVE
RBC, UA: NEGATIVE
Specific Gravity, UA: 1.014 (ref 1.005–1.030)
Urobilinogen, Ur: 0.2 mg/dL (ref 0.2–1.0)
pH, UA: 7 (ref 5.0–7.5)

## 2020-05-30 LAB — COMPREHENSIVE METABOLIC PANEL
ALT: 20 IU/L (ref 0–32)
AST: 16 IU/L (ref 0–40)
Albumin/Globulin Ratio: 1.2 (ref 1.2–2.2)
Albumin: 3.5 g/dL — ABNORMAL LOW (ref 3.6–4.6)
Alkaline Phosphatase: 64 IU/L (ref 44–121)
BUN/Creatinine Ratio: 12 (ref 12–28)
BUN: 22 mg/dL (ref 8–27)
Bilirubin Total: 0.5 mg/dL (ref 0.0–1.2)
CO2: 26 mmol/L (ref 20–29)
Calcium: 9.3 mg/dL (ref 8.7–10.3)
Chloride: 97 mmol/L (ref 96–106)
Creatinine, Ser: 1.82 mg/dL — ABNORMAL HIGH (ref 0.57–1.00)
GFR calc Af Amer: 28 mL/min/{1.73_m2} — ABNORMAL LOW (ref >59)
GFR calc non Af Amer: 25 mL/min/{1.73_m2} — ABNORMAL LOW (ref >59)
Globulin, Total: 2.9 g/dL (ref 1.5–4.5)
Glucose: 121 mg/dL — ABNORMAL HIGH (ref 65–99)
Potassium: 5.1 mmol/L (ref 3.5–5.2)
Sodium: 135 mmol/L (ref 134–144)
Total Protein: 6.4 g/dL (ref 6.0–8.5)

## 2020-05-30 LAB — MICROSCOPIC EXAMINATION
Bacteria, UA: NONE SEEN
Casts: NONE SEEN /lpf

## 2020-05-30 LAB — CBC
Hematocrit: 36.3 % (ref 34.0–46.6)
Hemoglobin: 11.8 g/dL (ref 11.1–15.9)
MCH: 28.4 pg (ref 26.6–33.0)
MCHC: 32.5 g/dL (ref 31.5–35.7)
MCV: 87 fL (ref 79–97)
Platelets: 342 10*3/uL (ref 150–450)
RBC: 4.16 x10E6/uL (ref 3.77–5.28)
RDW: 14.7 % (ref 11.7–15.4)
WBC: 3.7 10*3/uL (ref 3.4–10.8)

## 2020-05-31 LAB — URINE CULTURE

## 2020-06-03 ENCOUNTER — Other Ambulatory Visit: Payer: Self-pay | Admitting: Physician Assistant

## 2020-06-17 ENCOUNTER — Telehealth: Payer: Self-pay

## 2020-06-17 NOTE — Progress Notes (Signed)
Chronic Care Management Pharmacy Assistant   Name: Sherry Hess  MRN: [id number] DOB: 1930-11-21  Reason for Encounter: Disease State call for hypertension  Patient Questions:  1.  Have you seen any other providers since your last visit? Yes, 05/15/20-PCP-daughter called and wanted her mom seen for UTI  2.  Any changes in your medicines or health? No   PCP : [name], MD  Allergies:   Allergies  Allergen Reactions  . Minoxidil     rash  . Norvasc [Amlodipine Besylate]     rash  . Clonidine Derivatives Rash    [name]    Medications: Outpatient Encounter Medications as of 06/17/2020  Medication Sig  . atorvastatin (LIPITOR) 20 MG tablet TAKE 1 TABLET BY MOUTH DAILY  . Calcium Citrate (CAL-CITRATE PO) Take by mouth daily.  . carvedilol (COREG) 25 MG tablet Take 25 mg by mouth 2 (two) times daily with a meal.  . diltiazem (CARDIZEM CD) 180 MG 24 hr capsule TAKE 1 CAPSULE BY MOUTH DAILY  . donepezil (ARICEPT) 5 MG tablet TAKE 1 TABLET BY MOUTH DAILY AT BEDTIME  . ELIQUIS 2.5 MG TABS tablet TAKE 1 TABLET BY MOUTH TWICE DAILY  . Ferrous Sulfate (CVS SLOW RELEASE IRON PO) Take 1 tablet daily by mouth.  . Fish Oil-Cholecalciferol (FISH OIL + D3) 1000-1000 MG-UNIT CAPS Take by mouth.  Marland Kitchen FLUoxetine (PROZAC) 10 MG capsule TAKE 1 CAPSULE BY MOUTH ONCE DAILY  . hydrALAZINE (APRESOLINE) 50 MG tablet TAKE 1 TABLET BY MOUTH 2 TIMES DAILY  . levothyroxine (SYNTHROID) 25 MCG tablet TAKE 1 TABLET BY MOUTH ONCE DAILY ON AN EMPTY STOMACH 30 MINUTES BEFORE BREAKFAST  . memantine (NAMENDA) 10 MG tablet TAKE 1 TABLET BY MOUTH 2 TIMES DAILY  . Multiple Vitamin (MULTIVITAMIN) tablet Take 1 tablet by mouth daily.  Marland Kitchen omeprazole (PRILOSEC) 20 MG capsule TAKE 1 CAPSULE BY MOUTH DAILY  . sodium chloride 1 g tablet Take 1 g by mouth daily.  . Vitamin D, Ergocalciferol, (DRISDOL) 1.25 MG (50000 UNIT) CAPS capsule Take 1 capsule (50,000 Units total) by mouth 2 (two) times a week.    No facility-administered encounter medications on file as of 06/17/2020.    Current Diagnosis: Patient Active Problem List   Diagnosis Date Noted  . Hypertension, renal disease, stage 1-4 or unspecified chronic kidney disease 07/02/2019  . Atrial fibrillation ([place]) 07/02/2019  . CKD (chronic kidney disease) stage 4, GFR 15-29 ml/min (HCC) 07/02/2019  . Vascular dementia without behavioral disturbance ([place]) 07/02/2019  . Benign neoplasm of sigmoid colon   . Benign neoplasm of rectum   . Mixed hyperlipidemia 03/09/2017  . Normocytic anemia   . CVA (cerebral vascular accident) ([place]) 03/08/2017  . Paroxysmal atrial fibrillation ([place]) 02/23/2017  . Hx of transient ischemic attack (TIA) 10/18/2016  . Essential hypertension 11/20/2014    Reviewed chart prior to disease state call. Spoke with patient regarding BP  Recent Office Vitals: BP Readings from Last 3 Encounters:  04/02/20 (!) 128/58  03/11/20 136/62  12/03/19 124/66   Pulse Readings from Last 3 Encounters:  04/02/20 (!) 56  03/11/20 64  12/03/19 60    Wt Readings from Last 3 Encounters:  04/02/20 143 lb 6.4 oz (65 kg)  03/11/20 143 lb (64.9 kg)  12/03/19 144 lb 3.2 oz (65.4 kg)     Kidney Function Lab Results  Component Value Date/Time   CREATININE 1.82 (H) 05/29/2020 01:35 PM   CREATININE 1.71 (H) 04/02/2020 10:31 AM  GFRNONAA 25 (L) 05/29/2020 01:35 PM   GFRAA 28 (L) 05/29/2020 01:35 PM    BMP Latest Ref Rng & Units 05/29/2020 04/02/2020 10/01/2019  Glucose 65 - 99 mg/dL 121(H) 85 85  BUN 8 - 27 mg/dL 22 17 24   Creatinine 0.57 - 1.00 mg/dL 1.82(H) 1.71(H) 1.82(H)  BUN/Creat Ratio 12 - 28 12 10(L) 13  Sodium 134 - 144 mmol/L 135 141 141  Potassium 3.5 - 5.2 mmol/L 5.1 4.1 4.0  Chloride 96 - 106 mmol/L 97 100 100  CO2 20 - 29 mmol/L 26 25 28   Calcium 8.7 - 10.3 mg/dL 9.3 9.4 9.3    . Current antihypertensive regimen:  Carvedilol  Diltiazem   . How often are you checking your Blood Pressure? daily    . Current home BP readings: Patient reading today was 133/72   . What recent interventions/DTPs have been made by any provider to improve Blood Pressure control since last CPP Visit: Patient states that she takes her medication as directed and watches her salt intake.   . Any recent hospitalizations or ED visits since last visit with CPP? No   . What diet changes have been made to improve Blood Pressure Control?  Patient states she watches her diet and salt intake.  . What exercise is being done to improve your Blood Pressure Control?  Patient states she does her housework and tries to stay as active as possible. She stated she does pretty well for an older person.  Adherence Review: Is the patient currently on ACE/ARB medication? Yes Does the patient have >5 day gap between last estimated fill dates? No   Follow-Up:  Pharmacist Review  [name], CPP notified  [name], [place] Pharmacist Assistant [phone]

## 2020-06-26 ENCOUNTER — Other Ambulatory Visit: Payer: Self-pay | Admitting: Family Medicine

## 2020-06-27 ENCOUNTER — Other Ambulatory Visit: Payer: Self-pay

## 2020-06-27 MED ORDER — CARVEDILOL 25 MG PO TABS: 25.0000 mg | ORAL_TABLET | Freq: Two times a day (BID) | ORAL | 1 refills | Status: DC

## 2020-06-27 MED ORDER — APIXABAN 2.5 MG PO TABS: 2.5000 mg | ORAL_TABLET | Freq: Two times a day (BID) | ORAL | 1 refills | Status: DC

## 2020-07-02 ENCOUNTER — Other Ambulatory Visit: Payer: Self-pay | Admitting: Family Medicine

## 2020-07-02 DIAGNOSIS — I129 Hypertensive chronic kidney disease with stage 1 through stage 4 chronic kidney disease, or unspecified chronic kidney disease: Secondary | ICD-10-CM

## 2020-07-07 ENCOUNTER — Other Ambulatory Visit: Payer: Self-pay

## 2020-07-07 DIAGNOSIS — I10 Essential (primary) hypertension: Secondary | ICD-10-CM | POA: Insufficient documentation

## 2020-07-07 DIAGNOSIS — E7800 Pure hypercholesterolemia, unspecified: Secondary | ICD-10-CM | POA: Insufficient documentation

## 2020-07-07 DIAGNOSIS — F419 Anxiety disorder, unspecified: Secondary | ICD-10-CM | POA: Insufficient documentation

## 2020-07-07 DIAGNOSIS — G459 Transient cerebral ischemic attack, unspecified: Secondary | ICD-10-CM | POA: Insufficient documentation

## 2020-07-09 ENCOUNTER — Other Ambulatory Visit: Payer: Self-pay | Admitting: Physician Assistant

## 2020-07-09 ENCOUNTER — Other Ambulatory Visit: Payer: Self-pay

## 2020-07-09 ENCOUNTER — Ambulatory Visit: Payer: Medicare Other | Admitting: Cardiology

## 2020-07-09 ENCOUNTER — Encounter: Payer: Self-pay | Admitting: Cardiology

## 2020-07-09 VITALS — BP 128/60 | HR 61 | Ht 63.5 in | Wt 135.0 lb

## 2020-07-09 DIAGNOSIS — G459 Transient cerebral ischemic attack, unspecified: Secondary | ICD-10-CM | POA: Diagnosis not present

## 2020-07-09 DIAGNOSIS — I10 Essential (primary) hypertension: Secondary | ICD-10-CM

## 2020-07-09 DIAGNOSIS — I480 Paroxysmal atrial fibrillation: Secondary | ICD-10-CM

## 2020-07-09 DIAGNOSIS — I129 Hypertensive chronic kidney disease with stage 1 through stage 4 chronic kidney disease, or unspecified chronic kidney disease: Secondary | ICD-10-CM | POA: Diagnosis not present

## 2020-07-09 NOTE — Progress Notes (Signed)
Cardiology Office Note:    Date:  07/09/2020   ID:  Sherry Hess, DOB [birth year], MRN [id number]  PCP:  [name], MD  Cardiologist:  [name], MD    Referring MD: [name], MD   Chief Complaint  Patient presents with  . Follow-up  Doing well but I was sick few weeks ago  History of Present Illness:    Sherry Hess is a 87 y.o. female with past medical history significant for paroxysmal atrial fibrillation, anticoagulant with Eliquis, TIA, atypical chest pain, essential hypertension, dyslipidemia.  She comes today to my office to follow-up with her son like always.  [name] doing well denies have any chest pain tightness squeezing pressure burning chest but about 2 weeks ago she was sick weak tired exhausted and doing very little she is always on the go she likes to do a lot of things she cooks she does laundry overall health admit for her age she is doing amazingly well but about 2 weeks ago she did not do anything now she is gradually coming back to normal herself.  Denies having any palpitations, no chest pain no shortness of breath no swelling of lower extremities.  Past Medical History:  Diagnosis Date  . Anxiety   . Benign neoplasm of rectum   . Benign neoplasm of sigmoid colon   . Chest pain in adult 12/27/2015  . CKD (chronic kidney disease) stage 4, GFR 15-29 ml/min (HCC) 07/02/2019  . CVA (cerebral vascular accident) ([place]) 03/08/2017  . Dyspepsia 03/09/2017  . Dyspepsia 03/09/2017  . Essential hypertension 11/20/2014  . Fatigue 12/10/2016  . High cholesterol   . HLD (hyperlipidemia) 03/09/2017  . Hx of transient ischemic attack (TIA) 10/18/2016  . Hypertension   . Hypertension, renal disease, stage 1-4 or unspecified chronic kidney disease 07/02/2019  . Mixed hyperlipidemia 03/09/2017  . Normocytic anemia   . Occult blood positive stool 03/09/2017  . Paroxysmal atrial fibrillation ([place]) 02/23/2017  . Syncope 12/10/2016  . TIA (transient ischemic  attack)   . Vascular dementia without behavioral disturbance ([place]) 07/02/2019    Past Surgical History:  Procedure Laterality Date  . COLONOSCOPY N/A 03/11/2017   Procedure: COLONOSCOPY;  Surgeon: Gatha Mayer, MD;  Location[facility] END[facility]COPY;  Service: Endoscopy;  Laterality: N/A;  . ESOPHAGOGASTRODUODENOSCOPY N/A 03/11/2017   Procedure: ESOPHAGOGASTRODUODENOSCOPY (EGD);  Surgeon: Gatha Mayer, MD;  Location: [facility] ENDOSCOPY;  Service: Endoscopy;  Laterality: N/A;  . TONSILLECTOMY    . VESICOVAGINAL FISTULA CLOSURE W/ TAH      Current Medications: Current Meds  Medication Sig  . apixaban (ELIQUIS) 2.5 MG TABS tablet Take 1 tablet (2.5 mg total) by mouth 2 (two) times daily.  Marland Kitchen atorvastatin (LIPITOR) 20 MG tablet TAKE 1 TABLET BY MOUTH DAILY  . Calcium Citrate (CAL-CITRATE PO) Take 600 mg by mouth daily.  . carvedilol (COREG) 12.5 MG tablet Take 12.5 mg by mouth 2 (two) times daily.  Marland Kitchen diltiazem (CARDIZEM CD) 180 MG 24 hr capsule TAKE 1 CAPSULE BY MOUTH DAILY  . donepezil (ARICEPT) 5 MG tablet TAKE 1 TABLET BY MOUTH DAILY AT BEDTIME  . Ferrous Sulfate ([employer]S SLOW RELEASE IRON PO) Take 325 mg by mouth daily.  . Fish Oil-Cholecalciferol (FISH OIL + D3) 1000-1000 MG-UNIT CAPS Take by mouth.  Marland Kitchen FLUoxetine (PROZAC) 10 MG capsule TAKE 1 CAPSULE BY MOUTH ONCE DAILY  . hydrALAZINE (APRESOLINE) 50 MG tablet TAKE 1 TABLET BY MOUTH 2 TIMES DAILY  . levothyroxine (SYNTHROID) 25 MCG tablet  TAKE 1 TABLET BY MOUTH ONCE DAILY ON AN EMPTY STOMACH 30 MINUTES BEFORE BREAKFAST  . memantine (NAMENDA) 10 MG tablet TAKE 1 TABLET BY MOUTH 2 TIMES DAILY  . omeprazole (PRILOSEC) 20 MG capsule TAKE 1 CAPSULE BY MOUTH DAILY  . sodium chloride 1 g tablet Take 1 g by mouth daily.  . Vitamin D, Ergocalciferol, (DRISDOL) 1.25 MG (50000 UNIT) CAPS capsule Take 1 capsule (50,000 Units total) by mouth 2 (two) times a week.     Allergies:   Minoxidil, Norvasc [amlodipine besylate], and Clonidine derivatives   Social  History   Socioeconomic History  . Marital status: Widowed    Spouse name: Not on file  . Number of children: Not on file  . Years of education: Not on file  . Highest education level: Not on file  Occupational History  . Not on file  Tobacco Use  . Smoking status: Never Smoker  . Smokeless tobacco: Never Used  Vaping Use  . Vaping Use: Never used  Substance and Sexual Activity  . Alcohol use: No  . Drug use: No  . Sexual activity: Not on file  Other Topics Concern  . Not on file  Social History Narrative  . Not on file   Social Determinants of Health   Financial Resource Strain: Not on file  Food Insecurity: No Food Insecurity  . Worried About Charity fundraiser in the Last Year: Never true  . Ran Out of Food in the Last Year: Never true  Transportation Needs: No Transportation Needs  . Lack of Transportation (Medical): No  . Lack of Transportation (Non-Medical): No  Physical Activity: Not on file  Stress: Not on file  Social Connections: Not on file     Family History: [name] family history includes Atrial fibrillation in her son; Cancer in her brother and father; Dementia in her brother; Stroke in her mother. There is no history of Ataxia, Chorea, Mental retardation, Migraines, Multiple sclerosis, Neurofibromatosis, Neuropathy, Parkinsonism, or Seizures. ROS:   Please see the history of present illness.    All 14 point review of systems negative except as described per history of present illness  EKGs/Labs/Other Studies Reviewed:      Recent Labs: 04/02/2020: TSH 2.970 05/29/2020: ALT 20; BUN 22; Creatinine, Ser 1.82; Hemoglobin 11.8; Platelets 342; Potassium 5.1; Sodium 135  Recent Lipid Panel    Component Value Date/Time   CHOL 108 04/02/2020 1031   TRIG 87 04/02/2020 1031   HDL 30 (L) 04/02/2020 1031   CHOLHDL 3.6 04/02/2020 1031   CHOLHDL 3.3 03/09/2017 0226   VLDL 10 03/09/2017 0226   LDLCALC 61 04/02/2020 1031    Physical Exam:    VS:   BP 128/60 (BP Location: Left Arm, Patient Position: Sitting)   Pulse 61   Ht 5' 3.5" (1.613 m)   Wt 135 lb (61.2 kg)   SpO2 94%   BMI 23.54 kg/m     Wt Readings from Last 3 Encounters:  07/09/20 135 lb (61.2 kg)  04/02/20 143 lb 6.4 oz (65 kg)  03/11/20 143 lb (64.9 kg)     GEN:  Well nourished, well developed in no acute distress HEENT: Normal NECK: No JVD; No carotid bruits LYMPHATICS: No lymphadenopathy CARDIAC: RRR, no murmurs, no rubs, no gallops RESPIRATORY:  Clear to auscultation without rales, wheezing or rhonchi  ABDOMEN: Soft, non-tender, non-distended MUSCULOSKELETAL:  No edema; No deformity  SKIN: Warm and dry LOWER EXTREMITIES: no swelling NEUROLOGIC:  Alert and oriented x  3 PSYCHIATRIC:  Normal affect   ASSESSMENT:    1. Paroxysmal atrial fibrillation (HCC)   2. Essential hypertension   3. TIA (transient ischemic attack)   4. Hypertension, renal disease, stage 1-4 or unspecified chronic kidney disease    PLAN:    In order of problems listed above:  1. Paroxysmal atrial fibrillation EKG showed normal sinus rhythm she denies having palpitations, she is anticoagulated and AV blockade is achieved by Cardizem CD 180 as well as Coreg 12.5 twice a day.  We will continue that management. 2. Essential hypertension, blood pressure well controlled we will continue present management. 3. History of TIAs.  No new issues. 4. She was sick about 2 weeks ago when she looked pale today on the physical examination.  I will check a complete metabolic panel as well as CBC.  I will also schedule her to have an echocardiogram to reassess left ventricle ejection fraction, her EKG showed possibility of anterior lateral myocardial infarction age undetermined last echocardiogram I reviewed from 2020 which did not show any segmental wall motion normalities.  However EKG is worrisome and I will do echocardiogram.   Medication Adjustments/Labs and Tests Ordered: Current medicines are  reviewed at length with the patient today.  Concerns regarding medicines are outlined above.  No orders of the defined types were placed in this encounter.  Medication changes: No orders of the defined types were placed in this encounter.   Signed, Park Liter, MD, [facility] 07/09/2020 9:35 AM    [place]

## 2020-07-09 NOTE — Patient Instructions (Signed)
Medication Instructions:  Your physician recommends that you continue on your current medications as directed. Please refer to the Current Medication list given to you today.  *If you need a refill on your cardiac medications before your next appointment, please call your pharmacy*   Lab Work: Your physician recommends that you return for lab work today: cmp, cbc   If you have labs (blood work) drawn today and your tests are completely normal, you will receive your results only by: Marland Kitchen MyChart Message (if you have MyChart) OR . A paper copy in the mail If you have any lab test that is abnormal or we need to change your treatment, we will call you to review the results.   Testing/Procedures: Your physician has requested that you have an echocardiogram. Echocardiography is a painless test that uses sound waves to create images of your heart. It provides your doctor with information about the size and shape of your heart and how well your heart's chambers and valves are working. This procedure takes approximately one hour. There are no restrictions for this procedure.     Follow-Up: At [facility], you and your health needs are our priority.  As part of our continuing mission to provide you with exceptional heart care, we have created designated Provider Care Teams.  These Care Teams include your primary Cardiologist (physician) and Advanced Practice Providers (APPs -  Physician Assistants and Nurse Practitioners) who all work together to provide you with the care you need, when you need it.  We recommend signing up for the patient portal called "MyChart".  Sign up information is provided on this After Visit Summary.  MyChart is used to connect with patients for Virtual Visits (Telemedicine).  Patients are able to view lab/test results, encounter notes, upcoming appointments, etc.  Non-urgent messages can be sent to your provider as well.   To learn more about what you can do with MyChart, go  to [email or web address].    Your next appointment:   5 month(s)  The format for your next appointment:   In Person  Provider:   [name], MD   Other Instructions   Echocardiogram An echocardiogram is a test that uses sound waves (ultrasound) to produce images of the heart. Images from an echocardiogram can provide important information about:  Heart size and shape.  The size and thickness and movement of your heart's walls.  Heart muscle function and strength.  Heart valve function or if you have stenosis. Stenosis is when the heart valves are too narrow.  If blood is flowing backward through the heart valves (regurgitation).  A tumor or infectious growth around the heart valves.  Areas of heart muscle that are not working well because of poor blood flow or injury from a heart attack.  Aneurysm detection. An aneurysm is a weak or damaged part of an artery wall. The wall bulges out from the normal force of blood pumping through the body. Tell a health care provider about:  Any allergies you have.  All medicines you are taking, including vitamins, herbs, eye drops, creams, and over-the-counter medicines.  Any blood disorders you have.  Any surgeries you have had.  Any medical conditions you have.  Whether you are pregnant or may be pregnant. What are the risks? Generally, this is a safe test. However, problems may occur, including an allergic reaction to dye (contrast) that may be used during the test. What happens before the test? No specific preparation is needed. You may eat and  drink normally. What happens during the test?  You will take off your clothes from the waist up and put on a hospital gown.  Electrodes or electrocardiogram (ECG)patches may be placed on your chest. The electrodes or patches are then connected to a device that monitors your heart rate and rhythm.  You will lie down on a table for an ultrasound exam. A gel will be applied to  your chest to help sound waves pass through your skin.  A handheld device, called a transducer, will be pressed against your chest and moved over your heart. The transducer produces sound waves that travel to your heart and bounce back (or "echo" back) to the transducer. These sound waves will be captured in real-time and changed into images of your heart that can be viewed on a video monitor. The images will be recorded on a computer and reviewed by your health care provider.  You may be asked to change positions or hold your breath for a short time. This makes it easier to get different views or better views of your heart.  In some cases, you may receive contrast through an IV in one of your veins. This can improve the quality of the pictures from your heart. The procedure may vary among health care providers and hospitals.   What can I expect after the test? You may return to your normal, everyday life, including diet, activities, and medicines, unless your health care provider tells you not to do that. Follow these instructions at home:  It is up to you to get the results of your test. Ask your health care provider, or the department that is doing the test, when your results will be ready.  Keep all follow-up visits. This is important. Summary  An echocardiogram is a test that uses sound waves (ultrasound) to produce images of the heart.  Images from an echocardiogram can provide important information about the size and shape of your heart, heart muscle function, heart valve function, and other possible heart problems.  You do not need to do anything to prepare before this test. You may eat and drink normally.  After the echocardiogram is completed, you may return to your normal, everyday life, unless your health care provider tells you not to do that. This information is not intended to replace advice given to you by your health care provider. Make sure you discuss any questions you have  with your health care provider. Document Revised: 12/25/2019 Document Reviewed: 12/25/2019 Elsevier Patient Education  2021 [employer].

## 2020-07-09 NOTE — Addendum Note (Signed)
Addended by: Senaida Ores on: 07/09/2020 09:52 AM   Modules accepted: Orders

## 2020-07-10 LAB — COMPREHENSIVE METABOLIC PANEL
ALT: 6 IU/L (ref 0–32)
AST: 12 IU/L (ref 0–40)
Albumin/Globulin Ratio: 1.5 (ref 1.2–2.2)
Albumin: 3.8 g/dL (ref 3.6–4.6)
Alkaline Phosphatase: 63 IU/L (ref 44–121)
BUN/Creatinine Ratio: 11 — ABNORMAL LOW (ref 12–28)
BUN: 21 mg/dL (ref 8–27)
Bilirubin Total: 0.6 mg/dL (ref 0.0–1.2)
CO2: 23 mmol/L (ref 20–29)
Calcium: 9.6 mg/dL (ref 8.7–10.3)
Chloride: 98 mmol/L (ref 96–106)
Creatinine, Ser: 1.95 mg/dL — ABNORMAL HIGH (ref 0.57–1.00)
GFR calc Af Amer: 26 mL/min/{1.73_m2} — ABNORMAL LOW (ref >59)
GFR calc non Af Amer: 23 mL/min/{1.73_m2} — ABNORMAL LOW (ref >59)
Globulin, Total: 2.5 g/dL (ref 1.5–4.5)
Glucose: 108 mg/dL — ABNORMAL HIGH (ref 65–99)
Potassium: 3.8 mmol/L (ref 3.5–5.2)
Sodium: 135 mmol/L (ref 134–144)
Total Protein: 6.3 g/dL (ref 6.0–8.5)

## 2020-07-10 LAB — CBC
Hematocrit: 34.2 % (ref 34.0–46.6)
Hemoglobin: 11.2 g/dL (ref 11.1–15.9)
MCH: 28.4 pg (ref 26.6–33.0)
MCHC: 32.7 g/dL (ref 31.5–35.7)
MCV: 87 fL (ref 79–97)
Platelets: 187 10*3/uL (ref 150–450)
RBC: 3.95 x10E6/uL (ref 3.77–5.28)
RDW: 16 % — ABNORMAL HIGH (ref 11.7–15.4)
WBC: 2.9 10*3/uL — ABNORMAL LOW (ref 3.4–10.8)

## 2020-07-11 ENCOUNTER — Telehealth: Payer: Self-pay

## 2020-07-11 NOTE — Progress Notes (Signed)
    Chronic Care Management Pharmacy Assistant   Name: Sherry Hess  MRN: [id number] DOB: 1931-04-12  Reason for Encounter: Adherence   PCP : [name], MD  Allergies:   Allergies  Allergen Reactions  . Minoxidil     rash  . Norvasc [Amlodipine Besylate]     rash  . Clonidine Derivatives Rash    [name]    Medications: Outpatient Encounter Medications as of 07/11/2020  Medication Sig  . apixaban (ELIQUIS) 2.5 MG TABS tablet Take 1 tablet (2.5 mg total) by mouth 2 (two) times daily.  Marland Kitchen atorvastatin (LIPITOR) 20 MG tablet TAKE 1 TABLET BY MOUTH DAILY  . Calcium Citrate (CAL-CITRATE PO) Take 600 mg by mouth daily.  . carvedilol (COREG) 12.5 MG tablet Take 12.5 mg by mouth 2 (two) times daily.  Marland Kitchen diltiazem (CARDIZEM CD) 180 MG 24 hr capsule TAKE 1 CAPSULE BY MOUTH DAILY  . donepezil (ARICEPT) 5 MG tablet TAKE 1 TABLET BY MOUTH DAILY AT BEDTIME  . Ferrous Sulfate (CVS SLOW RELEASE IRON PO) Take 325 mg by mouth daily.  . Fish Oil-Cholecalciferol (FISH OIL + D3) 1000-1000 MG-UNIT CAPS Take by mouth.  Marland Kitchen FLUoxetine (PROZAC) 10 MG capsule TAKE 1 CAPSULE BY MOUTH ONCE DAILY  . hydrALAZINE (APRESOLINE) 50 MG tablet TAKE 1 TABLET BY MOUTH 2 TIMES DAILY  . levothyroxine (SYNTHROID) 25 MCG tablet TAKE 1 TABLET BY MOUTH ONCE DAILY ON AN EMPTY STOMACH 30 MINUTES BEFORE BREAKFAST  . memantine (NAMENDA) 10 MG tablet TAKE 1 TABLET BY MOUTH 2 TIMES DAILY  . Multiple Vitamin (MULTIVITAMIN) tablet Take 1 tablet by mouth daily.  Marland Kitchen omeprazole (PRILOSEC) 20 MG capsule TAKE 1 CAPSULE BY MOUTH DAILY  . sodium chloride 1 g tablet Take 1 g by mouth daily.  . Vitamin D, Ergocalciferol, (DRISDOL) 1.25 MG (50000 UNIT) CAPS capsule Take 1 capsule (50,000 Units total) by mouth 2 (two) times a week.   No facility-administered encounter medications on file as of 07/11/2020.    Current Diagnosis: Patient Active Problem List   Diagnosis Date Noted  . TIA (transient ischemic attack)   .  Hypertension   . High cholesterol   . Anxiety   . Hypertension, renal disease, stage 1-4 or unspecified chronic kidney disease 07/02/2019  . Atrial fibrillation ([place]) 07/02/2019  . CKD (chronic kidney disease) stage 4, GFR 15-29 ml/min (HCC) 07/02/2019  . Vascular dementia without behavioral disturbance ([place]) 07/02/2019  . Benign neoplasm of sigmoid colon   . Benign neoplasm of rectum   . Mixed hyperlipidemia 03/09/2017  . Occult blood positive stool 03/09/2017  . HLD (hyperlipidemia) 03/09/2017  . Dyspepsia 03/09/2017  . Normocytic anemia   . CVA (cerebral vascular accident) ([place]) 03/08/2017  . Paroxysmal atrial fibrillation ([place]) 02/23/2017  . Syncope 12/10/2016  . Fatigue 12/10/2016  . Hx of transient ischemic attack (TIA) 10/18/2016  . Chest pain in adult 12/27/2015  . Essential hypertension 11/20/2014    Chart review noted a cardiology visit on 07/09/20, Carvedilol was stopped as well as Clopidogrel.  Patient has next appointment with CPP on 09/30/20  Follow-Up:  Pharmacist Review  [name], [place], [place] Pharmacist Assistant [phone]

## 2020-08-08 ENCOUNTER — Other Ambulatory Visit: Payer: Self-pay

## 2020-08-08 ENCOUNTER — Ambulatory Visit (INDEPENDENT_AMBULATORY_CARE_PROVIDER_SITE_OTHER): Payer: Medicare Other

## 2020-08-08 DIAGNOSIS — G459 Transient cerebral ischemic attack, unspecified: Secondary | ICD-10-CM | POA: Diagnosis not present

## 2020-08-08 DIAGNOSIS — I10 Essential (primary) hypertension: Secondary | ICD-10-CM | POA: Diagnosis not present

## 2020-08-08 DIAGNOSIS — I480 Paroxysmal atrial fibrillation: Secondary | ICD-10-CM | POA: Diagnosis not present

## 2020-08-08 DIAGNOSIS — I129 Hypertensive chronic kidney disease with stage 1 through stage 4 chronic kidney disease, or unspecified chronic kidney disease: Secondary | ICD-10-CM | POA: Diagnosis not present

## 2020-08-08 LAB — ECHOCARDIOGRAM COMPLETE
Area-P 1/2: 5.66 cm2
P 1/2 time: 503 msec
S' Lateral: 2.1 cm

## 2020-08-08 NOTE — Progress Notes (Signed)
Complete echocardiogram performed.  Jimmy Jarrett [place] RDCS, RVT  

## 2020-08-12 ENCOUNTER — Telehealth: Payer: Self-pay

## 2020-08-12 NOTE — Telephone Encounter (Signed)
Sherry Hess notified of test results and verbalized understanding

## 2020-08-12 NOTE — Telephone Encounter (Signed)
-----   Message from Park Liter, MD sent at 08/12/2020  8:23 AM EDT ----- Echocardiogram showed preserved left ventricle ejection fraction, diastolic dysfunction, left atrium moderately enlarged,

## 2020-08-14 ENCOUNTER — Telehealth: Payer: Self-pay

## 2020-08-14 ENCOUNTER — Other Ambulatory Visit: Payer: Self-pay | Admitting: Family Medicine

## 2020-08-14 MED ORDER — VITAMIN D (ERGOCALCIFEROL) 1.25 MG (50000 UNIT) PO CAPS: 50000.0000 [IU] | ORAL_CAPSULE | ORAL | 1 refills | Status: DC

## 2020-08-14 NOTE — Telephone Encounter (Signed)
Pt's son left VM requesting Vit D refill if pt is to continue.   Last vit D lab: 04/02/2020  24.5   Please advise.   Royce Macadamia, [place] 08/14/20 11:02 AM

## 2020-08-18 ENCOUNTER — Other Ambulatory Visit: Payer: Self-pay | Admitting: Family Medicine

## 2020-09-04 ENCOUNTER — Other Ambulatory Visit: Payer: Self-pay | Admitting: Physician Assistant

## 2020-09-11 DIAGNOSIS — I129 Hypertensive chronic kidney disease with stage 1 through stage 4 chronic kidney disease, or unspecified chronic kidney disease: Secondary | ICD-10-CM | POA: Diagnosis not present

## 2020-09-11 DIAGNOSIS — N2581 Secondary hyperparathyroidism of renal origin: Secondary | ICD-10-CM | POA: Diagnosis not present

## 2020-09-11 DIAGNOSIS — N1832 Chronic kidney disease, stage 3b: Secondary | ICD-10-CM | POA: Diagnosis not present

## 2020-09-11 DIAGNOSIS — D649 Anemia, unspecified: Secondary | ICD-10-CM | POA: Diagnosis not present

## 2020-09-30 ENCOUNTER — Other Ambulatory Visit: Payer: Self-pay

## 2020-09-30 ENCOUNTER — Ambulatory Visit (INDEPENDENT_AMBULATORY_CARE_PROVIDER_SITE_OTHER): Payer: Medicare Other

## 2020-09-30 ENCOUNTER — Ambulatory Visit (INDEPENDENT_AMBULATORY_CARE_PROVIDER_SITE_OTHER): Payer: Medicare Other | Admitting: Family Medicine

## 2020-09-30 VITALS — BP 136/66 | HR 59 | Temp 97.2°F | Ht 64.0 in | Wt 135.0 lb

## 2020-09-30 DIAGNOSIS — E782 Mixed hyperlipidemia: Secondary | ICD-10-CM

## 2020-09-30 DIAGNOSIS — F0150 Vascular dementia without behavioral disturbance: Secondary | ICD-10-CM

## 2020-09-30 DIAGNOSIS — E039 Hypothyroidism, unspecified: Secondary | ICD-10-CM | POA: Diagnosis not present

## 2020-09-30 DIAGNOSIS — N184 Chronic kidney disease, stage 4 (severe): Secondary | ICD-10-CM

## 2020-09-30 DIAGNOSIS — I129 Hypertensive chronic kidney disease with stage 1 through stage 4 chronic kidney disease, or unspecified chronic kidney disease: Secondary | ICD-10-CM

## 2020-09-30 DIAGNOSIS — E559 Vitamin D deficiency, unspecified: Secondary | ICD-10-CM

## 2020-09-30 DIAGNOSIS — I482 Chronic atrial fibrillation, unspecified: Secondary | ICD-10-CM | POA: Diagnosis not present

## 2020-09-30 DIAGNOSIS — Z23 Encounter for immunization: Secondary | ICD-10-CM | POA: Diagnosis not present

## 2020-09-30 NOTE — Progress Notes (Signed)
Subjective:  Patient ID: Sherry Hess, female    DOB: 1931/03/29  Age: 87 y.o. MRN: [id number]  Chief Complaint  Patient presents with  . Hyperlipidemia  . Hypertension    HPI Mixed hyperlipidemia Takes lipitor and fish oil, maintains healthy diet Exercising some. Walks five days a week.   Hypertensive kidney disease with chronic kidney disease stage IV ([place]) Taking coreg and cardizem   Chronic atrial fibrillation ([place]) Taking eliquis daily. On cardizem and coreg.  Acquired hypothyroidism Currently taking synthroid daily  Vascular dementia Namenda and Aricept. Does not believe it is helping.   Current Outpatient Medications on File Prior to Visit  Medication Sig Dispense Refill  . apixaban (ELIQUIS) 2.5 MG TABS tablet Take 1 tablet (2.5 mg total) by mouth 2 (two) times daily. 180 tablet 1  . Calcium Citrate (CAL-CITRATE PO) Take 600 mg by mouth daily.    . carvedilol (COREG) 12.5 MG tablet Take 12.5 mg by mouth 2 (two) times daily.    Marland Kitchen diltiazem (CARDIZEM CD) 180 MG 24 hr capsule TAKE 1 CAPSULE BY MOUTH DAILY 90 capsule 2  . donepezil (ARICEPT) 5 MG tablet TAKE 1 TABLET BY MOUTH DAILY AT BEDTIME 90 tablet 1  . Ferrous Sulfate ([employer]S SLOW RELEASE IRON PO) Take 325 mg by mouth daily.    . Fish Oil-Cholecalciferol (FISH OIL + D3) 1000-1000 MG-UNIT CAPS Take by mouth.    Marland Kitchen FLUoxetine (PROZAC) 10 MG capsule TAKE 1 CAPSULE BY MOUTH ONCE DAILY 90 capsule 1  . hydrALAZINE (APRESOLINE) 50 MG tablet TAKE 1 TABLET BY MOUTH 2 TIMES DAILY 180 tablet 1  . levothyroxine (SYNTHROID) 25 MCG tablet TAKE 1 TABLET BY MOUTH ONCE DAILY ON AN EMPTY STOMACH 30 MINUTES BEFORE BREAKFAST 90 tablet 0  . memantine (NAMENDA) 10 MG tablet TAKE 1 TABLET BY MOUTH 2 TIMES DAILY 180 tablet 1  . Multiple Vitamin (MULTIVITAMIN) tablet Take 1 tablet by mouth daily.    Marland Kitchen omeprazole (PRILOSEC) 20 MG capsule TAKE 1 CAPSULE BY MOUTH DAILY 90 capsule 3  . sodium chloride 1 g tablet Take 1 g by mouth daily.     . Vitamin D, Ergocalciferol, (DRISDOL) 1.25 MG (50000 UNIT) CAPS capsule Take 1 capsule (50,000 Units total) by mouth 2 (two) times a week. 24 capsule 1   No current facility-administered medications on file prior to visit.   Past Medical History:  Diagnosis Date  . Anxiety   . Benign neoplasm of rectum   . Benign neoplasm of sigmoid colon   . Chest pain in adult 12/27/2015  . CKD (chronic kidney disease) stage 4, GFR 15-29 ml/min (HCC) 07/02/2019  . CVA (cerebral vascular accident) ([place]) 03/08/2017  . Dyspepsia 03/09/2017  . Dyspepsia 03/09/2017  . Essential hypertension 11/20/2014  . Fatigue 12/10/2016  . High cholesterol   . HLD (hyperlipidemia) 03/09/2017  . Hx of transient ischemic attack (TIA) 10/18/2016  . Hypertension   . Hypertension, renal disease, stage 1-4 or unspecified chronic kidney disease 07/02/2019  . Mixed hyperlipidemia 03/09/2017  . Normocytic anemia   . Occult blood positive stool 03/09/2017  . Paroxysmal atrial fibrillation ([place]) 02/23/2017  . Syncope 12/10/2016  . TIA (transient ischemic attack)   . Vascular dementia without behavioral disturbance ([place]) 07/02/2019   Past Surgical History:  Procedure Laterality Date  . COLONOSCOPY N/A 03/11/2017   Procedure: COLONOSCOPY;  Surgeon: Gatha Mayer, MD;  Location: [facility] ENDOSCOPY;  Service: Endoscopy;  Laterality: N/A;  . ESOPHAGOGASTRODUODENOSCOPY N/A 03/11/2017   Procedure:  ESOPHAGOGASTRODUODENOSCOPY (EGD);  Surgeon: Gatha Mayer, MD;  Location: [facility] ENDOSCOPY;  Service: Endoscopy;  Laterality: N/A;  . TONSILLECTOMY    . VESICOVAGINAL FISTULA CLOSURE W/ TAH      Family History  Problem Relation Age of Onset  . Cancer Father        lung  . Stroke Mother   . Cancer Brother        x2 bladder  . Dementia Brother   . Atrial fibrillation Son   . Ataxia Neg Hx   . Chorea Neg Hx   . Mental retardation Neg Hx   . Migraines Neg Hx   . Multiple sclerosis Neg Hx   . Neurofibromatosis Neg Hx   . Neuropathy Neg Hx    . Parkinsonism Neg Hx   . Seizures Neg Hx    Social History   Socioeconomic History  . Marital status: Widowed    Spouse name: Not on file  . Number of children: Not on file  . Years of education: Not on file  . Highest education level: Not on file  Occupational History  . Not on file  Tobacco Use  . Smoking status: Never Smoker  . Smokeless tobacco: Never Used  Vaping Use  . Vaping Use: Never used  Substance and Sexual Activity  . Alcohol use: No  . Drug use: No  . Sexual activity: Not on file  Other Topics Concern  . Not on file  Social History Narrative  . Not on file   Social Determinants of Health   Financial Resource Strain: Not on file  Food Insecurity: Not on file  Transportation Needs: Not on file  Physical Activity: Not on file  Stress: Not on file  Social Connections: Not on file    Review of Systems  Constitutional: Positive for fatigue. Negative for chills and fever.  HENT: Negative for congestion, ear pain, rhinorrhea and sore throat.   Respiratory: Negative for cough and shortness of breath.   Cardiovascular: Negative for chest pain.  Gastrointestinal: Negative for abdominal pain, constipation, diarrhea, nausea and vomiting.  Genitourinary: Negative for dysuria and urgency.  Musculoskeletal: Negative for back pain and myalgias.  Neurological: Negative for dizziness, weakness, light-headedness and headaches.  Psychiatric/Behavioral: Negative for dysphoric mood. The patient is not nervous/anxious.      Objective:  BP 136/66   Pulse (!) 59   Temp (!) 97.2 F (36.2 C)   Ht 5\' 4"  (1.626 m)   Wt 135 lb (61.2 kg)   SpO2 92%   BMI 23.17 kg/m   BP/Weight 09/30/2020 07/09/2020 10/93/2355  Systolic BP 732 202 542  Diastolic BP 66 60 58  Wt. (Lbs) 135 135 143.4  BMI 23.17 23.54 24.61    Physical Exam Vitals reviewed.  Constitutional:      Appearance: Normal appearance. [name] normal weight.  Neck:     Vascular: No carotid bruit.   Cardiovascular:     Rate and Rhythm: Normal rate and regular rhythm.     Pulses: Normal pulses.     Heart sounds: Normal heart sounds.  Pulmonary:     Effort: Pulmonary effort is normal. No respiratory distress.     Breath sounds: Normal breath sounds.  Abdominal:     General: Abdomen is flat. Bowel sounds are normal.     Palpations: Abdomen is soft.     Tenderness: There is no abdominal tenderness.  Neurological:     Mental Status: [name] alert.  Psychiatric:  Mood and Affect: Mood normal.        Behavior: Behavior normal.     Diabetic Foot Exam - Simple   No data filed      Lab Results  Component Value Date   WBC 3.5 09/30/2020   HGB 12.9 09/30/2020   HCT 39.7 09/30/2020   PLT 236 09/30/2020   GLUCOSE 86 09/30/2020   CHOL 99 (L) 09/30/2020   TRIG 63 09/30/2020   HDL 33 (L) 09/30/2020   LDLCALC 52 09/30/2020   ALT 6 09/30/2020   AST 20 09/30/2020   NA 139 09/30/2020   K 4.8 09/30/2020   CL 99 09/30/2020   CREATININE 2.14 (H) 09/30/2020   BUN 25 09/30/2020   CO2 26 09/30/2020   TSH 3.900 09/30/2020   INR 1.1 01/26/2017   HGBA1C 5.2 03/09/2017      Assessment & Plan:   1. Mixed hyperlipidemia Well controlled.  No changes to medicines.  Continue to work on eating a healthy diet and exercise.  Labs drawn today.  - CBC with Differential/Platelet - Lipid panel  2. Hypertensive kidney disease with chronic kidney disease stage IV (HCC) Stable.  Management per specialist.  - Comprehensive metabolic panel  3. Chronic atrial fibrillation (HCC) The current medical regimen is effective;  continue present plan and medications.  4. Acquired hypothyroidism Continue synthroid. - TSH  5. Vitamin D deficiency Decrease vitamin D to 50000 weekly.  - VITAMIN D 25 Hydroxy (Vit-D Deficiency, Fractures)  6. Vascular dementia without behavioral disturbance (HCC) Stable. The current medical regimen is effective;  continue present plan and  medications.  Orders Placed This Encounter  Procedures  . CBC with Differential/Platelet  . Comprehensive metabolic panel  . Lipid panel  . VITAMIN D 25 Hydroxy (Vit-D Deficiency, Fractures)  . TSH  . Cardiovascular Risk Assessment     Follow-up: Return in about 6 months (around 04/02/2021).  An After Visit Summary was printed and given to the patient.  [name], MD [facility] [phone]

## 2020-10-01 LAB — COMPREHENSIVE METABOLIC PANEL
ALT: 6 IU/L (ref 0–32)
AST: 20 IU/L (ref 0–40)
Albumin/Globulin Ratio: 1.5 (ref 1.2–2.2)
Albumin: 4.3 g/dL (ref 3.6–4.6)
Alkaline Phosphatase: 68 IU/L (ref 44–121)
BUN/Creatinine Ratio: 12 (ref 12–28)
BUN: 25 mg/dL (ref 8–27)
Bilirubin Total: 0.5 mg/dL (ref 0.0–1.2)
CO2: 26 mmol/L (ref 20–29)
Calcium: 10.4 mg/dL — ABNORMAL HIGH (ref 8.7–10.3)
Chloride: 99 mmol/L (ref 96–106)
Creatinine, Ser: 2.14 mg/dL — ABNORMAL HIGH (ref 0.57–1.00)
Globulin, Total: 2.8 g/dL (ref 1.5–4.5)
Glucose: 86 mg/dL (ref 65–99)
Potassium: 4.8 mmol/L (ref 3.5–5.2)
Sodium: 139 mmol/L (ref 134–144)
Total Protein: 7.1 g/dL (ref 6.0–8.5)
eGFR: 22 mL/min/{1.73_m2} — ABNORMAL LOW (ref >59)

## 2020-10-01 LAB — CBC WITH DIFFERENTIAL/PLATELET
Basophils Absolute: 0 10*3/uL (ref 0.0–0.2)
Basos: 1 %
EOS (ABSOLUTE): 0 10*3/uL (ref 0.0–0.4)
Eos: 0 %
Hematocrit: 39.7 % (ref 34.0–46.6)
Hemoglobin: 12.9 g/dL (ref 11.1–15.9)
Immature Grans (Abs): 0 10*3/uL (ref 0.0–0.1)
Immature Granulocytes: 1 %
Lymphocytes Absolute: 0.7 10*3/uL (ref 0.7–3.1)
Lymphs: 20 %
MCH: 28.2 pg (ref 26.6–33.0)
MCHC: 32.5 g/dL (ref 31.5–35.7)
MCV: 87 fL (ref 79–97)
Monocytes Absolute: 0.5 10*3/uL (ref 0.1–0.9)
Monocytes: 14 %
Neutrophils Absolute: 2.3 10*3/uL (ref 1.4–7.0)
Neutrophils: 64 %
Platelets: 236 10*3/uL (ref 150–450)
RBC: 4.58 x10E6/uL (ref 3.77–5.28)
RDW: 15.3 % (ref 11.7–15.4)
WBC: 3.5 10*3/uL (ref 3.4–10.8)

## 2020-10-01 LAB — LIPID PANEL
Chol/HDL Ratio: 3 ratio (ref 0.0–4.4)
Cholesterol, Total: 99 mg/dL — ABNORMAL LOW (ref 100–199)
HDL: 33 mg/dL — ABNORMAL LOW (ref >39)
LDL Chol Calc (NIH): 52 mg/dL (ref 0–99)
Triglycerides: 63 mg/dL (ref 0–149)
VLDL Cholesterol Cal: 14 mg/dL (ref 5–40)

## 2020-10-01 LAB — VITAMIN D 25 HYDROXY (VIT D DEFICIENCY, FRACTURES): Vit D, 25-Hydroxy: 154 ng/mL — ABNORMAL HIGH (ref 30.0–100.0)

## 2020-10-01 LAB — CARDIOVASCULAR RISK ASSESSMENT

## 2020-10-01 LAB — TSH: TSH: 3.9 u[IU]/mL (ref 0.450–4.500)

## 2020-10-07 ENCOUNTER — Other Ambulatory Visit: Payer: Self-pay | Admitting: Physician Assistant

## 2020-10-09 ENCOUNTER — Telehealth: Payer: PPO

## 2020-10-13 ENCOUNTER — Encounter: Payer: Self-pay | Admitting: Family Medicine

## 2020-10-14 ENCOUNTER — Telehealth: Payer: Self-pay

## 2020-10-14 NOTE — Progress Notes (Signed)
Chronic Care Management Pharmacy Assistant   Name: Sherry Hess  MRN: [id number] DOB: 03-20-1931  UNABLE TO REACH PATIENT   Reason for Encounter: Disease state call for HTN    Recent office visits:  09/30/2020: Sherry Brome, MD (PCP) / labs ordered/ no medication changes noted   Recent consult visits:  07/09/2020: Sherry Campus, MD (Cardiology) / ordered CBC, CMET and EKG 12-lead/ Clopidogrel discontinued per patient preference, Carvedilol dosage changed from 25 mg. To 12.5 mg. , twice daily.  Hospital visits:  No hospital visits since last CCM visit    Medications: Outpatient Encounter Medications as of 10/14/2020  Medication Sig  . apixaban (ELIQUIS) 2.5 MG TABS tablet Take 1 tablet (2.5 mg total) by mouth 2 (two) times daily.  Marland Kitchen atorvastatin (LIPITOR) 20 MG tablet TAKE 1 TABLET BY MOUTH DAILY  . Calcium Citrate (CAL-CITRATE PO) Take 600 mg by mouth daily.  . carvedilol (COREG) 12.5 MG tablet Take 12.5 mg by mouth 2 (two) times daily.  Marland Kitchen diltiazem (CARDIZEM CD) 180 MG 24 hr capsule TAKE 1 CAPSULE BY MOUTH DAILY  . donepezil (ARICEPT) 5 MG tablet TAKE 1 TABLET BY MOUTH DAILY AT BEDTIME  . Ferrous Sulfate (CVS SLOW RELEASE IRON PO) Take 325 mg by mouth daily.  . Fish Oil-Cholecalciferol (FISH OIL + D3) 1000-1000 MG-UNIT CAPS Take by mouth.  Marland Kitchen FLUoxetine (PROZAC) 10 MG capsule TAKE 1 CAPSULE BY MOUTH ONCE DAILY  . hydrALAZINE (APRESOLINE) 50 MG tablet TAKE 1 TABLET BY MOUTH 2 TIMES DAILY  . levothyroxine (SYNTHROID) 25 MCG tablet TAKE 1 TABLET BY MOUTH ONCE DAILY ON AN EMPTY STOMACH 30 MINUTES BEFORE BREAKFAST  . memantine (NAMENDA) 10 MG tablet TAKE 1 TABLET BY MOUTH 2 TIMES DAILY  . Multiple Vitamin (MULTIVITAMIN) tablet Take 1 tablet by mouth daily.  Marland Kitchen omeprazole (PRILOSEC) 20 MG capsule TAKE 1 CAPSULE BY MOUTH DAILY  . sodium chloride 1 g tablet Take 1 g by mouth daily.  . Vitamin D, Ergocalciferol, (DRISDOL) 1.25 MG (50000 UNIT) CAPS capsule Take 1 capsule (50,000  Units total) by mouth 2 (two) times a week.   No facility-administered encounter medications on file as of 10/14/2020.    Recent Office Vitals: BP Readings from Last 3 Encounters:  09/30/20 136/66  07/09/20 128/60  04/02/20 (!) 128/58   Pulse Readings from Last 3 Encounters:  09/30/20 (!) 59  07/09/20 61  04/02/20 (!) 56    Wt Readings from Last 3 Encounters:  09/30/20 135 lb (61.2 kg)  07/09/20 135 lb (61.2 kg)  04/02/20 143 lb 6.4 oz (65 kg)     Kidney Function Lab Results  Component Value Date/Time   CREATININE 2.14 (H) 09/30/2020 11:09 AM   CREATININE 1.95 (H) 07/09/2020 10:04 AM   GFRNONAA 23 (L) 07/09/2020 10:04 AM   GFRAA 26 (L) 07/09/2020 10:04 AM    BMP Latest Ref Rng & Units 09/30/2020 07/09/2020 05/29/2020  Glucose 65 - 99 mg/dL 86 108(H) 121(H)  BUN 8 - 27 mg/dL 25 21 22   Creatinine 0.57 - 1.00 mg/dL 2.14(H) 1.95(H) 1.82(H)  BUN/Creat Ratio 12 - 28 12 11(L) 12  Sodium 134 - 144 mmol/L 139 135 135  Potassium 3.5 - 5.2 mmol/L 4.8 3.8 5.1  Chloride 96 - 106 mmol/L 99 98 97  CO2 20 - 29 mmol/L 26 23 26   Calcium 8.7 - 10.3 mg/dL 10.4(H) 9.6 9.3    Adherence Review: Is the patient currently on ACE/ARB medication? no Does the patient have >5 day gap  between last estimated fill dates? CPP to review   Star Rating Drugs:  Medication:  Last Fill: Day Supply Atorvastatin  10/07/2020 [place] Clinical Pharmacist Assistant

## 2020-10-17 ENCOUNTER — Other Ambulatory Visit: Payer: Self-pay | Admitting: Family Medicine

## 2020-10-30 ENCOUNTER — Telehealth: Payer: Self-pay

## 2020-10-30 NOTE — Progress Notes (Signed)
    Chronic Care Management Pharmacy Assistant   Name: Sherry Hess  MRN: [id number] DOB: 1930/05/28   Reason for Encounter: Disease State for general adherence  Recent office visits:  11/07/20-[name] NP (PCP), altered mental status, Labs, Carvedilol changed to 25mg  daily, referral to physical therapy, Rocephin injection was given, follow up one week.   09/30/20-Dr Cox PCP, labs, Blood count normal. Kidney function gradually worsened.  Patient has stage IV kidney disease.  forwarded these labs to Dr. Hollie Salk, her nephrologist.  I do not believethis will change her treatment per Dr Tobie Poet.  Liver function normal.  Cholesterol well controlled other than the HDL is too low.  Recommend exerciseand eating healthy. Vitamin D is too high.  Recommend decrease vitamin D to 50,000 units onceweekly.  Thyroid normal  Recent consult visits:  none  Hospital visits:  None in previous 6 months  Medications: Outpatient Encounter Medications as of 10/30/2020  Medication Sig   apixaban (ELIQUIS) 2.5 MG TABS tablet Take 1 tablet (2.5 mg total) by mouth 2 (two) times daily.   atorvastatin (LIPITOR) 20 MG tablet TAKE 1 TABLET BY MOUTH DAILY   Calcium Citrate (CAL-CITRATE PO) Take 600 mg by mouth daily.   carvedilol (COREG) 12.5 MG tablet Take 12.5 mg by mouth 2 (two) times daily.   diltiazem (CARDIZEM CD) 180 MG 24 hr capsule TAKE 1 CAPSULE BY MOUTH DAILY   donepezil (ARICEPT) 5 MG tablet TAKE 1 TABLET BY MOUTH DAILY AT BEDTIME   Ferrous Sulfate (CVS SLOW RELEASE IRON PO) Take 325 mg by mouth daily.   Fish Oil-Cholecalciferol (FISH OIL + D3) 1000-1000 MG-UNIT CAPS Take by mouth.   FLUoxetine (PROZAC) 10 MG capsule TAKE 1 CAPSULE BY MOUTH ONCE DAILY   hydrALAZINE (APRESOLINE) 50 MG tablet TAKE 1 TABLET BY MOUTH 2 TIMES DAILY   levothyroxine (SYNTHROID) 25 MCG tablet TAKE 1 TABLET BY MOUTH ONCE DAILY ON AN EMPTY STOMACH 30 MINUTES BEFORE BREAKFAST   memantine (NAMENDA) 10 MG tablet TAKE 1 TABLET BY  MOUTH 2 TIMES DAILY   Multiple Vitamin (MULTIVITAMIN) tablet Take 1 tablet by mouth daily.   omeprazole (PRILOSEC) 20 MG capsule TAKE 1 CAPSULE BY MOUTH DAILY   sodium chloride 1 g tablet Take 1 g by mouth daily.   Vitamin D, Ergocalciferol, (DRISDOL) 1.25 MG (50000 UNIT) CAPS capsule Take 1 capsule (50,000 Units total) by mouth 2 (two) times a week.   No facility-administered encounter medications on file as of 10/30/2020.   Unable to reach patient   Care Gaps: AWV: 03/11/20 Eye Exam: Foot Exam: none listed   Star Rating Drugs: Eliquis   10/27/20 30ds Atorvastatin  10/07/20 90 Carvedilol  09/22/20  90 Levothyroxine  09/04/20 [place], [place] Pharmacist Assistant [phone]

## 2020-11-06 NOTE — Progress Notes (Signed)
Acute Office Visit  Subjective:    Patient ID: Sherry Hess, female    DOB: April 27, 1931, 87 y.o.   MRN: [id number]  Chief Complaint  Patient presents with   fatigued    Not feeling good!    HPI Sherry Hess is an 87 year old Caucasian female that is in today for generalized weakness and mental status changes. [name] accompanied by her son, Sherry Hess, who helps supplement health history. [name] sitting in w/c.Pt hypotensive today in office BP 80/48. Onset of symptoms was 1-week ago. Sherry Hess answers questions appropriately and follows commands. She tells me that she has been "laying around a lot more', states she eats 3 meals a day, and denies physical complaints. Some of her answers concerning review of systems vary from her son's responses. Sherry Hess states that she has poor po intake, increased forgetfulness, mild confusion, and decreased physical activity.Sherry Hess states Sherry Hess usually walks regularly outdoors with cousin for exercise but is no longer doing so. Per EMR, Sherry Hess saw PCP on 09/30/20. She has lost 3-pounds in 5 weeks. GFR 22 on 09/30/20. [name] followed by Sherry Hess, nephrology. Pt and son states she does not currently have advance directives in place and has not discussed end-stage-renal-disease options for dialysis.   Past Medical History:  Diagnosis Date   Anxiety    Benign neoplasm of rectum    Benign neoplasm of sigmoid colon    Chest pain in adult 12/27/2015   CKD (chronic kidney disease) stage 4, GFR 15-29 ml/min (Sherry Hess) 07/02/2019   CVA (cerebral vascular accident) (Sherry Hess) 03/08/2017   Dyspepsia 03/09/2017   Dyspepsia 03/09/2017   Essential hypertension 11/20/2014   Fatigue 12/10/2016   High cholesterol    HLD (hyperlipidemia) 03/09/2017   Hx of transient ischemic attack (TIA) 10/18/2016   Hypertension    Hypertension, renal disease, stage 1-4 or unspecified chronic kidney disease 07/02/2019   Mixed hyperlipidemia 03/09/2017   Normocytic anemia    Occult blood positive stool 03/09/2017    Paroxysmal atrial fibrillation (Sherry Hess) 02/23/2017   Syncope 12/10/2016   TIA (transient ischemic attack)    Vascular dementia without behavioral disturbance (Sherry Hess) 07/02/2019    Past Surgical History:  Procedure Laterality Date   COLONOSCOPY N/A 03/11/2017   Procedure: COLONOSCOPY;  Surgeon: Gatha Mayer, MD;  Location: [facility];  Service: Endoscopy;  Laterality: N/A;   ESOPHAGOGASTRODUODENOSCOPY N/A 03/11/2017   Procedure: ESOPHAGOGASTRODUODENOSCOPY (EGD);  Surgeon: Gatha Mayer, MD;  Location: [facility] ENDOSCOPY;  Service: Endoscopy;  Laterality: N/A;   TONSILLECTOMY     VESICOVAGINAL FISTULA CLOSURE W/ TAH      Family History  Problem Relation Age of Onset   Cancer Father        lung   Stroke Mother    Cancer Brother        x2 bladder   Dementia Brother    Atrial fibrillation Son    Ataxia Neg Hx    Chorea Neg Hx    Mental retardation Neg Hx    Migraines Neg Hx    Multiple sclerosis Neg Hx    Neurofibromatosis Neg Hx    Neuropathy Neg Hx    Parkinsonism Neg Hx    Seizures Neg Hx     Social History   Socioeconomic History   Marital status: Widowed    Spouse name: Not on file   Number of children: Not on file   Years of education: Not on file   Highest education level: Not on file  Occupational History  Not on file  Tobacco Use   Smoking status: Never   Smokeless tobacco: Never  Vaping Use   Vaping Use: Never used  Substance and Sexual Activity   Alcohol use: No   Drug use: No   Sexual activity: Not on file  Other Topics Concern   Not on file  Social History Narrative   Not on file   Social Determinants of Health   Financial Resource Strain: Not on file  Food Insecurity: Not on file  Transportation Needs: Not on file  Physical Activity: Not on file  Stress: Not on file  Social Connections: Not on file  Intimate Partner Violence: Not on file    Outpatient Medications Prior to Visit  Medication Sig Dispense Refill   apixaban (ELIQUIS) 2.5 MG  TABS tablet Take 1 tablet (2.5 mg total) by mouth 2 (two) times daily. 180 tablet 1   atorvastatin (LIPITOR) 20 MG tablet TAKE 1 TABLET BY MOUTH DAILY 90 tablet 0   Calcium Citrate (CAL-CITRATE PO) Take 600 mg by mouth daily.     carvedilol (COREG) 25 MG tablet      diltiazem (CARDIZEM CD) 180 MG 24 hr capsule TAKE 1 CAPSULE BY MOUTH DAILY 90 capsule 2   donepezil (ARICEPT) 5 MG tablet TAKE 1 TABLET BY MOUTH DAILY AT BEDTIME 90 tablet 1   Ferrous Sulfate ([employer]S SLOW RELEASE IRON PO) Take 325 mg by mouth daily.     Fish Oil-Cholecalciferol (FISH OIL + D3) 1000-1000 MG-UNIT CAPS Take by mouth.     FLUoxetine (PROZAC) 10 MG capsule TAKE 1 CAPSULE BY MOUTH ONCE DAILY 90 capsule 1   hydrALAZINE (APRESOLINE) 50 MG tablet TAKE 1 TABLET BY MOUTH 2 TIMES DAILY 180 tablet 1   levothyroxine (SYNTHROID) 25 MCG tablet TAKE 1 TABLET BY MOUTH ONCE DAILY ON AN EMPTY STOMACH 30 MINUTES BEFORE BREAKFAST 90 tablet 0   memantine (NAMENDA) 10 MG tablet TAKE 1 TABLET BY MOUTH 2 TIMES DAILY 180 tablet 1   Multiple Vitamin (MULTIVITAMIN) tablet Take 1 tablet by mouth daily.     omeprazole (PRILOSEC) 20 MG capsule TAKE 1 CAPSULE BY MOUTH DAILY 90 capsule 3   sodium chloride 1 g tablet Take 1 g by mouth daily.     Vitamin D, Ergocalciferol, (DRISDOL) 1.25 MG (50000 UNIT) CAPS capsule Take 1 capsule (50,000 Units total) by mouth 2 (two) times a week. 24 capsule 1   carvedilol (COREG) 12.5 MG tablet Take 12.5 mg by mouth 2 (two) times daily.     No facility-administered medications prior to visit.    Allergies  Allergen Reactions   Minoxidil     rash   Norvasc [Amlodipine Besylate]     rash   Clonidine Derivatives Rash    [name]    Review of Systems  Constitutional:  Positive for activity change (decreased), appetite change (decreased) and fatigue. Negative for chills and fever.  HENT:  Negative for congestion and sore throat.   Eyes: Negative.   Respiratory:  Negative for cough and shortness of  breath.   Cardiovascular:  Negative for chest pain.  Gastrointestinal:  Negative for abdominal pain and nausea.  Endocrine: Negative.   Genitourinary:  Negative for dysuria, flank pain, frequency and urgency.  Musculoskeletal:  Negative for back pain.  Skin: Negative.   Allergic/Immunologic: Negative.   Neurological:  Negative for dizziness and headaches.  Hematological: Negative.   Psychiatric/Behavioral:  Positive for confusion and decreased concentration. Negative for sleep disturbance.  Objective:    Physical Exam Vitals reviewed.  Constitutional:      Appearance: [name] ill-appearing.  HENT:     Right Ear: Tympanic membrane normal.     Left Ear: Tympanic membrane normal.     Mouth/Throat:     Mouth: Mucous membranes are dry.  Eyes:     Pupils: Pupils are equal, round, and reactive to light.     Comments: Eyeglasses in place  Cardiovascular:     Rate and Rhythm: Normal rate and regular rhythm.     Pulses: Normal pulses.     Heart sounds: Normal heart sounds.  Pulmonary:     Effort: Pulmonary effort is normal.     Breath sounds: Normal breath sounds.  Abdominal:     General: Bowel sounds are normal.     Palpations: Abdomen is soft.  Skin:    General: Skin is warm and dry.     Capillary Refill: Capillary refill takes less than 2 seconds.  Neurological:     Mental Status: [name] alert and oriented to person, place, and time. Mental status is at baseline.     Motor: Weakness present.  Psychiatric:        Mood and Affect: Mood normal.        Behavior: Behavior normal.   BP (!) 80/48 (BP Location: Left Arm, Patient Position: Sitting)   Pulse 70   Temp 97.8 F (36.6 C) (Temporal)   Ht '5\' 4"'  (1.626 m)   Wt 132 lb (59.9 kg)   SpO2 94%   BMI 22.66 kg/m   Wt Readings from Last 3 Encounters:  11/07/20 132 lb (59.9 kg)  09/30/20 135 lb (61.2 kg)  07/09/20 135 lb (61.2 kg)    Health Maintenance Due  Topic Date Due   Zoster Vaccines- Shingrix (1 of 2) Never  done    There are no preventive care reminders to display for this patient.   Lab Results  Component Value Date   TSH 3.900 09/30/2020   Lab Results  Component Value Date   WBC 3.5 09/30/2020   HGB 12.9 09/30/2020   HCT 39.7 09/30/2020   MCV 87 09/30/2020   PLT 236 09/30/2020   Lab Results  Component Value Date   NA 139 09/30/2020   K 4.8 09/30/2020   CO2 26 09/30/2020   GLUCOSE 86 09/30/2020   BUN 25 09/30/2020   CREATININE 2.14 (H) 09/30/2020   BILITOT 0.5 09/30/2020   ALKPHOS 68 09/30/2020   AST 20 09/30/2020   ALT 6 09/30/2020   PROT 7.1 09/30/2020   ALBUMIN 4.3 09/30/2020   CALCIUM 10.4 (H) 09/30/2020   ANIONGAP 13 03/12/2017   EGFR 22 (L) 09/30/2020   Lab Results  Component Value Date   CHOL 99 (L) 09/30/2020   Lab Results  Component Value Date   HDL 33 (L) 09/30/2020   Lab Results  Component Value Date   LDLCALC 52 09/30/2020   Lab Results  Component Value Date   TRIG 63 09/30/2020   Lab Results  Component Value Date   CHOLHDL 3.0 09/30/2020   Lab Results  Component Value Date   HGBA1C 5.2 03/09/2017       Assessment & Plan:    1. Altered mental status, unspecified altered mental status type - POCT urinalysis dipstick - CBC with Differential/Platelet - Comprehensive metabolic panel  2. Hypotension, unspecified hypotension type -hold blood pressure medications -monitor BP  -fall precautions  Pt's son, Sherry Hess communicated via texting with cell  phone with his sister-pt's daughter, who helps care for patient. Per sister, requested if IVF bolus could be given to patient. Explained in detail inability of medical office to give IVF. Explained to son, Sherry Hess, that stage 4 CKD per last labs made IVF fluid bolus challenging due to possibility of fluid overload. Recommended pt be taken to ED for stat labs, assess kidney function, evaluation by physician, and possible treatment options. Randall declined at this time due to concerns for  mother's frailty to endure prolonged wait times and possible exposure to COVID-19. Explained medical office will not have stat labs returned until this evening. Recommended if pt's condition worsens seek emergency medical care immediately. Randall verbalized understanding.   3. Generalized weakness - Ambulatory referral to Physical Therapy  4. CKD stage 4 secondary to hypertension (HCC) -CMP-stat -CBC-stat  5. Urinary tract infection without hematuria, site unspecified - cefTRIAXone (ROCEPHIN) injection 1 g - Urine Culture  6. Age-related physical debility - Ambulatory referral to Physical Therapy   Rocephin injection given in office Push fluids, especially water Increase intake, Boost shakes Fill out advance directives and return copy to office to add to chart; Advance directive packet given to patient's son Consider future chronic kidney disease and options for end-stage kidney disease We will call you with lab results Seek emergency medical care for severe symptoms Monitor blood pressure, hold antihypertensive medications if systolic (top number) is less than 627 mmHG and diastolic is less than [id number] (bottom number) Return in 1-week for follow-up with Sherry Tobie Poet and recheck urine  Follow-up: 1-week  An After Visit Summary was printed and given to the patient.  I spent 60 minutes dedicated to the care of this patient on the date of this encounter to include face-to-face time with the patient, as well as: EMR and prescription medication management.  I,Katherina A Bramblett,acting as a scribe for [employer], NP.,have documented all relevant documentation on the behalf of Rip Harbour, NP,as directed by  Rip Harbour, NP while in the presence of Rip Harbour, NP.  I, Rip Harbour, NP, have reviewed all documentation for this visit. The documentation on 11/07/20 for the exam, diagnosis, procedures, and orders are all accurate and complete.   Signed, Rip Harbour, NP [place] [phone]

## 2020-11-07 ENCOUNTER — Encounter: Payer: Self-pay | Admitting: Nurse Practitioner

## 2020-11-07 ENCOUNTER — Other Ambulatory Visit: Payer: Self-pay

## 2020-11-07 ENCOUNTER — Ambulatory Visit (INDEPENDENT_AMBULATORY_CARE_PROVIDER_SITE_OTHER): Payer: Medicare Other | Admitting: Nurse Practitioner

## 2020-11-07 VITALS — BP 80/48 | HR 70 | Temp 97.8°F | Ht 64.0 in | Wt 132.0 lb

## 2020-11-07 DIAGNOSIS — R4182 Altered mental status, unspecified: Secondary | ICD-10-CM

## 2020-11-07 DIAGNOSIS — R531 Weakness: Secondary | ICD-10-CM

## 2020-11-07 DIAGNOSIS — N184 Chronic kidney disease, stage 4 (severe): Secondary | ICD-10-CM

## 2020-11-07 DIAGNOSIS — N390 Urinary tract infection, site not specified: Secondary | ICD-10-CM | POA: Diagnosis not present

## 2020-11-07 DIAGNOSIS — I959 Hypotension, unspecified: Secondary | ICD-10-CM

## 2020-11-07 DIAGNOSIS — I129 Hypertensive chronic kidney disease with stage 1 through stage 4 chronic kidney disease, or unspecified chronic kidney disease: Secondary | ICD-10-CM

## 2020-11-07 DIAGNOSIS — R54 Age-related physical debility: Secondary | ICD-10-CM

## 2020-11-07 LAB — COMPREHENSIVE METABOLIC PANEL
ALT: 30 IU/L (ref 0–32)
AST: 231 IU/L (ref 0–40)
Albumin/Globulin Ratio: 1.5 (ref 1.2–2.2)
Albumin: 3.7 g/dL (ref 3.6–4.6)
Alkaline Phosphatase: 82 IU/L (ref 44–121)
BUN/Creatinine Ratio: 15 (ref 12–28)
BUN: 39 mg/dL — ABNORMAL HIGH (ref 8–27)
Bilirubin Total: 0.6 mg/dL (ref 0.0–1.2)
CO2: 24 mmol/L (ref 20–29)
Calcium: 9.3 mg/dL (ref 8.7–10.3)
Chloride: 95 mmol/L — ABNORMAL LOW (ref 96–106)
Creatinine, Ser: 2.54 mg/dL — ABNORMAL HIGH (ref 0.57–1.00)
Globulin, Total: 2.4 g/dL (ref 1.5–4.5)
Glucose: 127 mg/dL — ABNORMAL HIGH (ref 65–99)
Potassium: 5 mmol/L (ref 3.5–5.2)
Sodium: 134 mmol/L (ref 134–144)
Total Protein: 6.1 g/dL (ref 6.0–8.5)
eGFR: 18 mL/min/{1.73_m2} — ABNORMAL LOW (ref >59)

## 2020-11-07 LAB — CBC WITH DIFFERENTIAL/PLATELET
Basophils Absolute: 0.1 10*3/uL (ref 0.0–0.2)
Basos: 1 %
EOS (ABSOLUTE): 0 10*3/uL (ref 0.0–0.4)
Eos: 0 %
Hematocrit: 37.1 % (ref 34.0–46.6)
Hemoglobin: 12.3 g/dL (ref 11.1–15.9)
Lymphocytes Absolute: 0.4 10*3/uL — ABNORMAL LOW (ref 0.7–3.1)
Lymphs: 5 %
MCH: 27.9 pg (ref 26.6–33.0)
MCHC: 33.2 g/dL (ref 31.5–35.7)
MCV: 84 fL (ref 79–97)
Monocytes Absolute: 1.8 10*3/uL — ABNORMAL HIGH (ref 0.1–0.9)
Monocytes: 20 %
Neutrophils Absolute: 6.5 10*3/uL (ref 1.4–7.0)
Neutrophils: 74 %
Platelets: 154 10*3/uL (ref 150–450)
RBC: 4.41 x10E6/uL (ref 3.77–5.28)
RDW: 15.9 % — ABNORMAL HIGH (ref 11.7–15.4)
WBC: 8.8 10*3/uL (ref 3.4–10.8)

## 2020-11-07 LAB — POCT URINALYSIS DIPSTICK
Blood, UA: NEGATIVE
Glucose, UA: NEGATIVE
Ketones, UA: NEGATIVE
Nitrite, UA: NEGATIVE
Protein, UA: POSITIVE — AB
Spec Grav, UA: 1.025 (ref 1.010–1.025)
Urobilinogen, UA: NEGATIVE E.U./dL — AB
pH, UA: 6 (ref 5.0–8.0)

## 2020-11-07 MED ORDER — CEFTRIAXONE SODIUM 1 G IJ SOLR
1.0000 g | Freq: Once | INTRAMUSCULAR | Status: AC
  Administered 2020-11-07: 1 g via INTRAMUSCULAR

## 2020-11-07 NOTE — Patient Instructions (Addendum)
Rocephin injection given in office Push fluids, especially water Increase intake, Boost shakes Fill out advance directives and return copy to office to add to chart Consider future chronic kidney disease and options for end-stage kidney disease We will call you with lab results Seek emergency medical care for severe symptoms Monitor blood pressure, hold antihypertensive medications if systolic (top number) is less than 161 mmHG and diastolic is less than [id number] (bottom number) Return in 1-week for follow-up with Dr Tobie Poet and recheck urine  Urinary Tract Infection, Adult A urinary tract infection (UTI) is an infection of any part of the urinary tract. The urinary tract includes: The kidneys. The ureters. The bladder. The urethra. These organs make, store, and get rid of pee (urine) in the body. What are the causes? This infection is caused by germs (bacteria) in your genital area. These germs grow and cause swelling (inflammation) of your urinary tract. What increases the risk? The following factors may make you more likely to develop this condition: Using a small, thin tube (catheter) to drain pee. Not being able to control when you pee or poop (incontinence). Being female. If you are female, these things can increase the risk: Using these methods to prevent pregnancy: A medicine that kills sperm (spermicide). A device that blocks sperm (diaphragm). Having low levels of a female hormone (estrogen). Being pregnant. You are more likely to develop this condition if: You have genes that add to your risk. You are sexually active. You take antibiotic medicines. You have trouble peeing because of: A prostate that is bigger than normal, if you are female. A blockage in the part of your body that drains pee from the bladder. A kidney stone. A nerve condition that affects your bladder. Not getting enough to drink. Not peeing often enough. You have other conditions, such as: Diabetes. A  weak disease-fighting system (immune system). Sickle cell disease. Gout. Injury of the spine. What are the signs or symptoms? Symptoms of this condition include: Needing to pee right away. Peeing small amounts often. Pain or burning when peeing. Blood in the pee. Pee that smells bad or not like normal. Trouble peeing. Pee that is cloudy. Fluid coming from the vagina, if you are female. Pain in the belly or lower back. Other symptoms include: Vomiting. Not feeling hungry. Feeling mixed up (confused). This may be the first symptom in older adults. Being tired and grouchy (irritable). A fever. Watery poop (diarrhea). How is this treated? Taking antibiotic medicine. Taking other medicines. Drinking enough water. In some cases, you may need to see a specialist. Follow these instructions at home:  Medicines Take over-the-counter and prescription medicines only as told by your doctor. If you were prescribed an antibiotic medicine, take it as told by your doctor. Do not stop taking it even if you start to feel better. General instructions Make sure you: Pee until your bladder is empty. Do not hold pee for a long time. Empty your bladder after sex. Wipe from front to back after peeing or pooping if you are a female. Use each tissue one time when you wipe. Drink enough fluid to keep your pee pale yellow. Keep all follow-up visits. Contact a doctor if: You do not get better after 1-2 days. Your symptoms go away and then come back. Get help right away if: You have very bad back pain. You have very bad pain in your lower belly. You have a fever. You have chills. You feeling like you will vomit or you vomit.  Summary A urinary tract infection (UTI) is an infection of any part of the urinary tract. This condition is caused by germs in your genital area. There are many risk factors for a UTI. Treatment includes antibiotic medicines. Drink enough fluid to keep your pee pale  yellow. This information is not intended to replace advice given to you by your health care provider. Make sure you discuss any questions you have with your healthcare provider. Document Revised: 12/14/2019 Document Reviewed: 12/14/2019 Elsevier Patient Education  [place]. Hypotension As your heart beats, it forces blood through your body. This force is called blood pressure. If you have hypotension, you have low blood pressure. When your blood pressure is too low, you may not get enough blood to your brain or other parts of your body. This may cause you to feel weak, light-headed, have a fast heartbeat, or even pass out (faint). Low blood pressure may be harmless, or it may cause serious problems. What are the causes? Blood loss. Not enough water in the body (dehydration). Heart problems. Hormone problems. Pregnancy. A very bad infection. Not having enough of certain nutrients. Very bad allergic reactions. Certain medicines. What increases the risk? Age. The risk increases as you get older. Conditions that affect the heart or the brain and spinal cord (central nervous system). Taking certain medicines. Being pregnant. What are the signs or symptoms? Feeling: Weak. Light-headed. Dizzy. Tired (fatigued). Blurred vision. Fast heartbeat. Passing out, in very bad cases. How is this treated? Changing your diet. This may involve eating more salt (sodium) or drinking more water. Taking medicines to raise your blood pressure. Changing how much you take (the dosage) of some of your medicines. Wearing compression stockings. These stockings help to prevent blood clots and reduce swelling in your legs. In some cases, you may need to go to the hospital for: Fluid replacement. This means you will receive fluids through an IV tube. Blood replacement. This means you will receive donated blood through an IV tube (transfusion). Treating an infection or heart problems, if this  applies. Monitoring. You may need to be monitored while medicines that you are taking wear off. Follow these instructions at home: Eating and drinking  Drink enough fluids to keep your pee (urine) pale yellow. Eat a healthy diet. Follow instructions from your doctor about what you can eat or drink. A healthy diet includes: Fresh fruits and vegetables. Whole grains. Low-fat (lean) meats. Low-fat dairy products. Eat extra salt only as told. Do not add extra salt to your diet unless your doctor tells you to. Eat small meals often. Avoid standing up quickly after you eat.  Medicines Take over-the-counter and prescription medicines only as told by your doctor. Follow instructions from your doctor about changing how much you take of your medicines, if this applies. Do not stop or change any of your medicines on your own. General instructions  Wear compression stockings as told by your doctor. Get up slowly from lying down or sitting. Avoid hot showers and a lot of heat as told by your doctor. Return to your normal activities as told by your doctor. Ask what activities are safe for you. Do not use any products that contain nicotine or tobacco, such as cigarettes, e-cigarettes, and chewing tobacco. If you need help quitting, ask your doctor. Keep all follow-up visits as told by your doctor. This is important.  Contact a doctor if: You throw up (vomit). You have watery poop (diarrhea). You have a fever for more than  2-3 days. You feel more thirsty than normal. You feel weak and tired. Get help right away if: You have chest pain. You have a fast or uneven heartbeat. You lose feeling (have numbness) in any part of your body. You cannot move your arms or your legs. You have trouble talking. You get sweaty or feel light-headed. You pass out. You have trouble breathing. You have trouble staying awake. You feel mixed up (confused). Summary Hypotension is also called low blood pressure.  It is when the force of blood pumping through your arteries is too weak. Hypotension may be harmless, or it may cause serious problems. Treatment may include changing your diet and medicines, and wearing compression stockings. In very bad cases, you may need to go to the hospital. This information is not intended to replace advice given to you by your health care provider. Make sure you discuss any questions you have with your healthcare provider. Document Revised: 10/27/2017 Document Reviewed: 10/27/2017 Elsevier Patient Education  [place].

## 2020-11-10 DIAGNOSIS — D649 Anemia, unspecified: Secondary | ICD-10-CM | POA: Diagnosis not present

## 2020-11-10 DIAGNOSIS — E440 Moderate protein-calorie malnutrition: Secondary | ICD-10-CM | POA: Diagnosis not present

## 2020-11-10 DIAGNOSIS — K5790 Diverticulosis of intestine, part unspecified, without perforation or abscess without bleeding: Secondary | ICD-10-CM | POA: Diagnosis not present

## 2020-11-10 DIAGNOSIS — J9811 Atelectasis: Secondary | ICD-10-CM | POA: Diagnosis not present

## 2020-11-10 DIAGNOSIS — N170 Acute kidney failure with tubular necrosis: Secondary | ICD-10-CM | POA: Diagnosis not present

## 2020-11-10 DIAGNOSIS — I517 Cardiomegaly: Secondary | ICD-10-CM | POA: Diagnosis not present

## 2020-11-10 DIAGNOSIS — I959 Hypotension, unspecified: Secondary | ICD-10-CM | POA: Diagnosis not present

## 2020-11-10 DIAGNOSIS — N179 Acute kidney failure, unspecified: Secondary | ICD-10-CM | POA: Diagnosis not present

## 2020-11-10 DIAGNOSIS — R531 Weakness: Secondary | ICD-10-CM | POA: Diagnosis not present

## 2020-11-10 DIAGNOSIS — N2889 Other specified disorders of kidney and ureter: Secondary | ICD-10-CM | POA: Diagnosis not present

## 2020-11-10 DIAGNOSIS — S22080A Wedge compression fracture of T11-T12 vertebra, initial encounter for closed fracture: Secondary | ICD-10-CM | POA: Diagnosis not present

## 2020-11-10 DIAGNOSIS — J9601 Acute respiratory failure with hypoxia: Secondary | ICD-10-CM | POA: Diagnosis not present

## 2020-11-11 DIAGNOSIS — N170 Acute kidney failure with tubular necrosis: Secondary | ICD-10-CM | POA: Diagnosis not present

## 2020-11-11 DIAGNOSIS — I340 Nonrheumatic mitral (valve) insufficiency: Secondary | ICD-10-CM | POA: Diagnosis not present

## 2020-11-11 DIAGNOSIS — E440 Moderate protein-calorie malnutrition: Secondary | ICD-10-CM | POA: Diagnosis not present

## 2020-11-11 DIAGNOSIS — I4891 Unspecified atrial fibrillation: Secondary | ICD-10-CM | POA: Diagnosis not present

## 2020-11-11 DIAGNOSIS — J9601 Acute respiratory failure with hypoxia: Secondary | ICD-10-CM | POA: Diagnosis not present

## 2020-11-11 DIAGNOSIS — I361 Nonrheumatic tricuspid (valve) insufficiency: Secondary | ICD-10-CM | POA: Diagnosis not present

## 2020-11-12 DIAGNOSIS — N170 Acute kidney failure with tubular necrosis: Secondary | ICD-10-CM | POA: Diagnosis not present

## 2020-11-12 DIAGNOSIS — R0602 Shortness of breath: Secondary | ICD-10-CM | POA: Diagnosis not present

## 2020-11-12 DIAGNOSIS — I700 Atherosclerosis of aorta: Secondary | ICD-10-CM | POA: Diagnosis not present

## 2020-11-12 DIAGNOSIS — J9811 Atelectasis: Secondary | ICD-10-CM | POA: Diagnosis not present

## 2020-11-12 DIAGNOSIS — J9601 Acute respiratory failure with hypoxia: Secondary | ICD-10-CM | POA: Diagnosis not present

## 2020-11-12 DIAGNOSIS — J90 Pleural effusion, not elsewhere classified: Secondary | ICD-10-CM | POA: Diagnosis not present

## 2020-11-12 DIAGNOSIS — E440 Moderate protein-calorie malnutrition: Secondary | ICD-10-CM | POA: Diagnosis not present

## 2020-11-13 DIAGNOSIS — E440 Moderate protein-calorie malnutrition: Secondary | ICD-10-CM | POA: Diagnosis not present

## 2020-11-13 DIAGNOSIS — J9601 Acute respiratory failure with hypoxia: Secondary | ICD-10-CM | POA: Diagnosis not present

## 2020-11-13 DIAGNOSIS — N170 Acute kidney failure with tubular necrosis: Secondary | ICD-10-CM | POA: Diagnosis not present

## 2020-11-14 ENCOUNTER — Ambulatory Visit: Payer: Medicare Other | Admitting: Family Medicine

## 2020-11-14 DIAGNOSIS — N170 Acute kidney failure with tubular necrosis: Secondary | ICD-10-CM | POA: Diagnosis not present

## 2020-11-14 DIAGNOSIS — E440 Moderate protein-calorie malnutrition: Secondary | ICD-10-CM | POA: Diagnosis not present

## 2020-11-14 DIAGNOSIS — J9601 Acute respiratory failure with hypoxia: Secondary | ICD-10-CM | POA: Diagnosis not present

## 2020-11-15 DIAGNOSIS — N170 Acute kidney failure with tubular necrosis: Secondary | ICD-10-CM | POA: Diagnosis not present

## 2020-11-15 DIAGNOSIS — E440 Moderate protein-calorie malnutrition: Secondary | ICD-10-CM | POA: Diagnosis not present

## 2020-11-15 DIAGNOSIS — J9601 Acute respiratory failure with hypoxia: Secondary | ICD-10-CM | POA: Diagnosis not present

## 2020-11-16 DIAGNOSIS — E440 Moderate protein-calorie malnutrition: Secondary | ICD-10-CM | POA: Diagnosis not present

## 2020-11-16 DIAGNOSIS — N170 Acute kidney failure with tubular necrosis: Secondary | ICD-10-CM | POA: Diagnosis not present

## 2020-11-16 DIAGNOSIS — J9601 Acute respiratory failure with hypoxia: Secondary | ICD-10-CM | POA: Diagnosis not present

## 2020-11-17 DIAGNOSIS — E440 Moderate protein-calorie malnutrition: Secondary | ICD-10-CM | POA: Diagnosis not present

## 2020-11-17 DIAGNOSIS — J9601 Acute respiratory failure with hypoxia: Secondary | ICD-10-CM | POA: Diagnosis not present

## 2020-11-17 DIAGNOSIS — N170 Acute kidney failure with tubular necrosis: Secondary | ICD-10-CM | POA: Diagnosis not present

## 2020-11-18 DIAGNOSIS — J9601 Acute respiratory failure with hypoxia: Secondary | ICD-10-CM | POA: Diagnosis not present

## 2020-11-18 DIAGNOSIS — N170 Acute kidney failure with tubular necrosis: Secondary | ICD-10-CM | POA: Diagnosis not present

## 2020-11-18 DIAGNOSIS — E440 Moderate protein-calorie malnutrition: Secondary | ICD-10-CM | POA: Diagnosis not present

## 2020-11-19 DIAGNOSIS — N170 Acute kidney failure with tubular necrosis: Secondary | ICD-10-CM | POA: Diagnosis not present

## 2020-11-19 DIAGNOSIS — E440 Moderate protein-calorie malnutrition: Secondary | ICD-10-CM | POA: Diagnosis not present

## 2020-11-19 DIAGNOSIS — J9601 Acute respiratory failure with hypoxia: Secondary | ICD-10-CM | POA: Diagnosis not present

## 2020-11-20 DIAGNOSIS — E440 Moderate protein-calorie malnutrition: Secondary | ICD-10-CM | POA: Diagnosis not present

## 2020-11-20 DIAGNOSIS — J9601 Acute respiratory failure with hypoxia: Secondary | ICD-10-CM | POA: Diagnosis not present

## 2020-11-20 DIAGNOSIS — N170 Acute kidney failure with tubular necrosis: Secondary | ICD-10-CM | POA: Diagnosis not present

## 2020-11-21 ENCOUNTER — Telehealth: Payer: Self-pay

## 2020-11-21 DIAGNOSIS — J9601 Acute respiratory failure with hypoxia: Secondary | ICD-10-CM | POA: Diagnosis not present

## 2020-11-21 DIAGNOSIS — N170 Acute kidney failure with tubular necrosis: Secondary | ICD-10-CM | POA: Diagnosis not present

## 2020-11-21 DIAGNOSIS — E440 Moderate protein-calorie malnutrition: Secondary | ICD-10-CM | POA: Diagnosis not present

## 2020-11-21 NOTE — Progress Notes (Signed)
    Chronic Care Management Pharmacy Assistant   Name: Sherry Hess  MRN: [id number] DOB: Mar 18, 1931   Reason for Encounter: Medication Review for possible assistance with Eliquis  Recent office visits:  11/07/20-Sherry Montez Morita NP (PCP), altered mental status, labs ordered, Carvedilol increased to 25mg  daily, Rocephin injection was given, referral to physical therapy. Follow up one week.   Recent consult visits:  none  Hospital visits:  11/07/20-11/21/20-[facility], for Kidney infection, Dehydration, and low blood pressure.  Eliquis was discontinued.  Medications: Outpatient Encounter Medications as of 11/21/2020  Medication Sig   apixaban (ELIQUIS) 2.5 MG TABS tablet Take 1 tablet (2.5 mg total) by mouth 2 (two) times daily.   atorvastatin (LIPITOR) 20 MG tablet TAKE 1 TABLET BY MOUTH DAILY   Calcium Citrate (CAL-CITRATE PO) Take 600 mg by mouth daily.   carvedilol (COREG) 25 MG tablet    diltiazem (CARDIZEM CD) 180 MG 24 hr capsule TAKE 1 CAPSULE BY MOUTH DAILY   donepezil (ARICEPT) 5 MG tablet TAKE 1 TABLET BY MOUTH DAILY AT BEDTIME   Ferrous Sulfate (CVS SLOW RELEASE IRON PO) Take 325 mg by mouth daily.   Fish Oil-Cholecalciferol (FISH OIL + D3) 1000-1000 MG-UNIT CAPS Take by mouth.   FLUoxetine (PROZAC) 10 MG capsule TAKE 1 CAPSULE BY MOUTH ONCE DAILY   hydrALAZINE (APRESOLINE) 50 MG tablet TAKE 1 TABLET BY MOUTH 2 TIMES DAILY   levothyroxine (SYNTHROID) 25 MCG tablet TAKE 1 TABLET BY MOUTH ONCE DAILY ON AN EMPTY STOMACH 30 MINUTES BEFORE BREAKFAST   memantine (NAMENDA) 10 MG tablet TAKE 1 TABLET BY MOUTH 2 TIMES DAILY   Multiple Vitamin (MULTIVITAMIN) tablet Take 1 tablet by mouth daily.   omeprazole (PRILOSEC) 20 MG capsule TAKE 1 CAPSULE BY MOUTH DAILY   sodium chloride 1 g tablet Take 1 g by mouth daily.   Vitamin D, Ergocalciferol, (DRISDOL) 1.25 MG (50000 UNIT) CAPS capsule Take 1 capsule (50,000 Units total) by mouth 2 (two) times a week.   No  facility-administered encounter medications on file as of 11/21/2020.    [name], CPP asked me to contact patient son and inquire if patient assistance is needed for Eliquis.   Spoke to patient son, he stated the patient has been in the hospital for two weeks as of today,  he stated she was admitted for kidney infection, low blood pressure and dehydration.  He stated she is being discharge sometime today.  He stated that she has a kidney mass that was bleeding, so the doctors had discontinued her Eliquis for now, she is going to have follow up appointments with Urologist and Nephrologist and they are both aware of her condition.  He stated the patient was able to afford her Eliquis before it was $45 per month.  If she is put back on this in the future and it becomes a hardship he will let us know.  He stated she makes $14.000/year.   Son stated her blood pressure was 80/48, they had temporarily stopped her medications for that, once her numbers had improved, they introduced back one at a time.    Gaps AWV: 03/11/20   [name], [name] Pharmacist Assistant [phone]

## 2020-11-22 DIAGNOSIS — N189 Chronic kidney disease, unspecified: Secondary | ICD-10-CM | POA: Diagnosis not present

## 2020-11-22 DIAGNOSIS — M19079 Primary osteoarthritis, unspecified ankle and foot: Secondary | ICD-10-CM | POA: Diagnosis not present

## 2020-11-22 DIAGNOSIS — I129 Hypertensive chronic kidney disease with stage 1 through stage 4 chronic kidney disease, or unspecified chronic kidney disease: Secondary | ICD-10-CM | POA: Diagnosis not present

## 2020-11-22 DIAGNOSIS — I480 Paroxysmal atrial fibrillation: Secondary | ICD-10-CM | POA: Diagnosis not present

## 2020-11-22 DIAGNOSIS — N2889 Other specified disorders of kidney and ureter: Secondary | ICD-10-CM | POA: Diagnosis not present

## 2020-11-22 DIAGNOSIS — J449 Chronic obstructive pulmonary disease, unspecified: Secondary | ICD-10-CM | POA: Diagnosis not present

## 2020-11-22 DIAGNOSIS — F32A Depression, unspecified: Secondary | ICD-10-CM | POA: Diagnosis not present

## 2020-11-22 DIAGNOSIS — E7800 Pure hypercholesterolemia, unspecified: Secondary | ICD-10-CM | POA: Diagnosis not present

## 2020-11-22 DIAGNOSIS — D509 Iron deficiency anemia, unspecified: Secondary | ICD-10-CM | POA: Diagnosis not present

## 2020-11-22 DIAGNOSIS — E860 Dehydration: Secondary | ICD-10-CM | POA: Diagnosis not present

## 2020-11-22 DIAGNOSIS — I080 Rheumatic disorders of both mitral and aortic valves: Secondary | ICD-10-CM | POA: Diagnosis not present

## 2020-11-22 DIAGNOSIS — F0390 Unspecified dementia without behavioral disturbance: Secondary | ICD-10-CM | POA: Diagnosis not present

## 2020-11-22 DIAGNOSIS — F419 Anxiety disorder, unspecified: Secondary | ICD-10-CM | POA: Diagnosis not present

## 2020-11-22 DIAGNOSIS — Z8673 Personal history of transient ischemic attack (TIA), and cerebral infarction without residual deficits: Secondary | ICD-10-CM | POA: Diagnosis not present

## 2020-11-22 DIAGNOSIS — N170 Acute kidney failure with tubular necrosis: Secondary | ICD-10-CM | POA: Diagnosis not present

## 2020-11-22 DIAGNOSIS — Z9981 Dependence on supplemental oxygen: Secondary | ICD-10-CM | POA: Diagnosis not present

## 2020-11-24 ENCOUNTER — Telehealth: Payer: Self-pay

## 2020-11-24 DIAGNOSIS — D509 Iron deficiency anemia, unspecified: Secondary | ICD-10-CM | POA: Diagnosis not present

## 2020-11-24 DIAGNOSIS — E860 Dehydration: Secondary | ICD-10-CM | POA: Diagnosis not present

## 2020-11-24 DIAGNOSIS — I080 Rheumatic disorders of both mitral and aortic valves: Secondary | ICD-10-CM | POA: Diagnosis not present

## 2020-11-24 DIAGNOSIS — I129 Hypertensive chronic kidney disease with stage 1 through stage 4 chronic kidney disease, or unspecified chronic kidney disease: Secondary | ICD-10-CM | POA: Diagnosis not present

## 2020-11-24 DIAGNOSIS — I480 Paroxysmal atrial fibrillation: Secondary | ICD-10-CM | POA: Diagnosis not present

## 2020-11-24 DIAGNOSIS — N2889 Other specified disorders of kidney and ureter: Secondary | ICD-10-CM | POA: Diagnosis not present

## 2020-11-24 DIAGNOSIS — M19079 Primary osteoarthritis, unspecified ankle and foot: Secondary | ICD-10-CM | POA: Diagnosis not present

## 2020-11-24 DIAGNOSIS — F0390 Unspecified dementia without behavioral disturbance: Secondary | ICD-10-CM | POA: Diagnosis not present

## 2020-11-24 DIAGNOSIS — N189 Chronic kidney disease, unspecified: Secondary | ICD-10-CM | POA: Diagnosis not present

## 2020-11-24 DIAGNOSIS — Z8673 Personal history of transient ischemic attack (TIA), and cerebral infarction without residual deficits: Secondary | ICD-10-CM | POA: Diagnosis not present

## 2020-11-24 DIAGNOSIS — N170 Acute kidney failure with tubular necrosis: Secondary | ICD-10-CM | POA: Diagnosis not present

## 2020-11-24 DIAGNOSIS — E7800 Pure hypercholesterolemia, unspecified: Secondary | ICD-10-CM | POA: Diagnosis not present

## 2020-11-24 DIAGNOSIS — Z9981 Dependence on supplemental oxygen: Secondary | ICD-10-CM | POA: Diagnosis not present

## 2020-11-24 DIAGNOSIS — F419 Anxiety disorder, unspecified: Secondary | ICD-10-CM | POA: Diagnosis not present

## 2020-11-24 DIAGNOSIS — F32A Depression, unspecified: Secondary | ICD-10-CM | POA: Diagnosis not present

## 2020-11-24 DIAGNOSIS — J449 Chronic obstructive pulmonary disease, unspecified: Secondary | ICD-10-CM | POA: Diagnosis not present

## 2020-11-24 NOTE — Telephone Encounter (Signed)
  Transition Care Management Follow-up Telephone Call    Sherry Hess 09-28-1930  Admit Date: 11/10/20 Discharge Date: 11/21/20 Discharged from where: [facility]  Diagnoses: Acute on chronic kidney failure, Generalized weakness, renal mass  2 day post discharge: 11/23/20 7 day post discharge: 11/28/20 14 day post discharge: 12/05/20  Sherry [facility], [facility]/21/20 with the diagnoses listed above.  She was contacted today via telephone in regards to transition of care.    Patient admitted from ED and started on IV fluids, initial BUN/Creat 76 and 3.1 which improved to 23 and 1.30 at discharge. She worked with PT during admission showing improvement with strength and endurance.  She will continue this with home health after discharge.  Her O2 was noted to drop to 87-88% on room air after ambulation, she was supplemented with 1-2 LPM and it improved.  This appeared to be due to some mild underlining COPD.  CT done during admission revealed a large necrotic mass lesion appearing to arise from the upper pole of the left kidney consistent with renal cell carcinoma.  Outpatient follow-up with Dr Nila Nephew in one week.  Eliquis discontinued due to necrosis of tumor and risk of possible bleeding   Items Reviewed: Did the pt receive and understand the discharge instructions provided? Yes  Any new allergies since your discharge? No  Dietary orders reviewed? Yes Do you have support at home? Yes   Home Care and Equipment/Supplies: Were home health services ordered? yes If so, what is the name of the agency? [facility]  Were any new equipment or medical supplies ordered?  Yes: Oxygen  Transferring/Ambulation- [place]  Family supportive, checking in on patient, and arranging care.  Any patient concerns? no  Follow up appointments reviewed: PCP Hospital f/u appt confirmed? Yes   Are transportation arrangements needed? No  If their  condition worsens, is the pt aware to call PCP or go to the Emergency Dept.? Yes Was the patient provided with contact information for the PCP's office/after hours number? Yes Was to pt encouraged to call back with questions or concerns? Yes    Shelle Iron, LPN 37/09/64 38:38 AM

## 2020-11-24 NOTE — Telephone Encounter (Signed)
Sherry Hess w/ home health calling for verbal orders. Requesting twice a week for four weeks, once a week for two weeks, then once every other week until finish of cert period. Gave verbal orders. Nurse also wanted dr aware of pt taking iron twice daily, calcium twice daily. Drug interactions are the atorvastatin and diltiazem. Please advise if changes need to be made.   Royce Macadamia, [place] 11/24/20 8:10 AM

## 2020-11-25 DIAGNOSIS — I129 Hypertensive chronic kidney disease with stage 1 through stage 4 chronic kidney disease, or unspecified chronic kidney disease: Secondary | ICD-10-CM | POA: Diagnosis not present

## 2020-11-25 DIAGNOSIS — N170 Acute kidney failure with tubular necrosis: Secondary | ICD-10-CM | POA: Diagnosis not present

## 2020-11-25 DIAGNOSIS — F419 Anxiety disorder, unspecified: Secondary | ICD-10-CM | POA: Diagnosis not present

## 2020-11-25 DIAGNOSIS — N189 Chronic kidney disease, unspecified: Secondary | ICD-10-CM | POA: Diagnosis not present

## 2020-11-25 DIAGNOSIS — I080 Rheumatic disorders of both mitral and aortic valves: Secondary | ICD-10-CM | POA: Diagnosis not present

## 2020-11-25 DIAGNOSIS — Z8673 Personal history of transient ischemic attack (TIA), and cerebral infarction without residual deficits: Secondary | ICD-10-CM | POA: Diagnosis not present

## 2020-11-25 DIAGNOSIS — J449 Chronic obstructive pulmonary disease, unspecified: Secondary | ICD-10-CM | POA: Diagnosis not present

## 2020-11-25 DIAGNOSIS — E7800 Pure hypercholesterolemia, unspecified: Secondary | ICD-10-CM | POA: Diagnosis not present

## 2020-11-25 DIAGNOSIS — M19079 Primary osteoarthritis, unspecified ankle and foot: Secondary | ICD-10-CM | POA: Diagnosis not present

## 2020-11-25 DIAGNOSIS — N2889 Other specified disorders of kidney and ureter: Secondary | ICD-10-CM | POA: Diagnosis not present

## 2020-11-25 DIAGNOSIS — D509 Iron deficiency anemia, unspecified: Secondary | ICD-10-CM | POA: Diagnosis not present

## 2020-11-25 DIAGNOSIS — I480 Paroxysmal atrial fibrillation: Secondary | ICD-10-CM | POA: Diagnosis not present

## 2020-11-25 DIAGNOSIS — E860 Dehydration: Secondary | ICD-10-CM | POA: Diagnosis not present

## 2020-11-25 DIAGNOSIS — Z9981 Dependence on supplemental oxygen: Secondary | ICD-10-CM | POA: Diagnosis not present

## 2020-11-25 DIAGNOSIS — F32A Depression, unspecified: Secondary | ICD-10-CM | POA: Diagnosis not present

## 2020-11-25 DIAGNOSIS — F0390 Unspecified dementia without behavioral disturbance: Secondary | ICD-10-CM | POA: Diagnosis not present

## 2020-11-26 DIAGNOSIS — Z9981 Dependence on supplemental oxygen: Secondary | ICD-10-CM | POA: Diagnosis not present

## 2020-11-26 DIAGNOSIS — N2889 Other specified disorders of kidney and ureter: Secondary | ICD-10-CM | POA: Diagnosis not present

## 2020-11-26 DIAGNOSIS — F419 Anxiety disorder, unspecified: Secondary | ICD-10-CM | POA: Diagnosis not present

## 2020-11-26 DIAGNOSIS — N189 Chronic kidney disease, unspecified: Secondary | ICD-10-CM | POA: Diagnosis not present

## 2020-11-26 DIAGNOSIS — F0390 Unspecified dementia without behavioral disturbance: Secondary | ICD-10-CM | POA: Diagnosis not present

## 2020-11-26 DIAGNOSIS — J449 Chronic obstructive pulmonary disease, unspecified: Secondary | ICD-10-CM | POA: Diagnosis not present

## 2020-11-26 DIAGNOSIS — N170 Acute kidney failure with tubular necrosis: Secondary | ICD-10-CM | POA: Diagnosis not present

## 2020-11-26 DIAGNOSIS — I080 Rheumatic disorders of both mitral and aortic valves: Secondary | ICD-10-CM | POA: Diagnosis not present

## 2020-11-26 DIAGNOSIS — Z8673 Personal history of transient ischemic attack (TIA), and cerebral infarction without residual deficits: Secondary | ICD-10-CM | POA: Diagnosis not present

## 2020-11-26 DIAGNOSIS — E7800 Pure hypercholesterolemia, unspecified: Secondary | ICD-10-CM | POA: Diagnosis not present

## 2020-11-26 DIAGNOSIS — F32A Depression, unspecified: Secondary | ICD-10-CM | POA: Diagnosis not present

## 2020-11-26 DIAGNOSIS — M19079 Primary osteoarthritis, unspecified ankle and foot: Secondary | ICD-10-CM | POA: Diagnosis not present

## 2020-11-26 DIAGNOSIS — D509 Iron deficiency anemia, unspecified: Secondary | ICD-10-CM | POA: Diagnosis not present

## 2020-11-26 DIAGNOSIS — I129 Hypertensive chronic kidney disease with stage 1 through stage 4 chronic kidney disease, or unspecified chronic kidney disease: Secondary | ICD-10-CM | POA: Diagnosis not present

## 2020-11-26 DIAGNOSIS — I480 Paroxysmal atrial fibrillation: Secondary | ICD-10-CM | POA: Diagnosis not present

## 2020-11-26 DIAGNOSIS — E860 Dehydration: Secondary | ICD-10-CM | POA: Diagnosis not present

## 2020-11-27 ENCOUNTER — Other Ambulatory Visit: Payer: Self-pay

## 2020-11-27 ENCOUNTER — Telehealth: Payer: Self-pay | Admitting: Nurse Practitioner

## 2020-11-27 ENCOUNTER — Encounter: Payer: Self-pay | Admitting: Family Medicine

## 2020-11-27 ENCOUNTER — Ambulatory Visit (INDEPENDENT_AMBULATORY_CARE_PROVIDER_SITE_OTHER): Payer: Medicare Other | Admitting: Family Medicine

## 2020-11-27 VITALS — BP 110/62 | HR 76 | Temp 97.8°F | Resp 20 | Ht 64.0 in | Wt 148.0 lb

## 2020-11-27 DIAGNOSIS — R0602 Shortness of breath: Secondary | ICD-10-CM

## 2020-11-27 DIAGNOSIS — I5033 Acute on chronic diastolic (congestive) heart failure: Secondary | ICD-10-CM | POA: Diagnosis not present

## 2020-11-27 DIAGNOSIS — I5032 Chronic diastolic (congestive) heart failure: Secondary | ICD-10-CM | POA: Insufficient documentation

## 2020-11-27 DIAGNOSIS — Z7189 Other specified counseling: Secondary | ICD-10-CM

## 2020-11-27 DIAGNOSIS — D631 Anemia in chronic kidney disease: Secondary | ICD-10-CM

## 2020-11-27 DIAGNOSIS — N184 Chronic kidney disease, stage 4 (severe): Secondary | ICD-10-CM

## 2020-11-27 DIAGNOSIS — F0150 Vascular dementia without behavioral disturbance: Secondary | ICD-10-CM

## 2020-11-27 DIAGNOSIS — N2889 Other specified disorders of kidney and ureter: Secondary | ICD-10-CM | POA: Insufficient documentation

## 2020-11-27 DIAGNOSIS — I482 Chronic atrial fibrillation, unspecified: Secondary | ICD-10-CM

## 2020-11-27 DIAGNOSIS — I129 Hypertensive chronic kidney disease with stage 1 through stage 4 chronic kidney disease, or unspecified chronic kidney disease: Secondary | ICD-10-CM

## 2020-11-27 DIAGNOSIS — N19 Unspecified kidney failure: Secondary | ICD-10-CM | POA: Diagnosis not present

## 2020-11-27 DIAGNOSIS — N289 Disorder of kidney and ureter, unspecified: Secondary | ICD-10-CM | POA: Diagnosis not present

## 2020-11-27 DIAGNOSIS — J9611 Chronic respiratory failure with hypoxia: Secondary | ICD-10-CM | POA: Diagnosis not present

## 2020-11-27 DIAGNOSIS — R7989 Other specified abnormal findings of blood chemistry: Secondary | ICD-10-CM | POA: Diagnosis not present

## 2020-11-27 DIAGNOSIS — J90 Pleural effusion, not elsewhere classified: Secondary | ICD-10-CM | POA: Diagnosis not present

## 2020-11-27 LAB — COMPREHENSIVE METABOLIC PANEL
ALT: 10 IU/L (ref 0–32)
AST: 11 IU/L (ref 0–40)
Albumin/Globulin Ratio: 1.1 — ABNORMAL LOW (ref 1.2–2.2)
Albumin: 2.9 g/dL — ABNORMAL LOW (ref 3.6–4.6)
Alkaline Phosphatase: 62 IU/L (ref 44–121)
BUN/Creatinine Ratio: 15 (ref 12–28)
BUN: 23 mg/dL (ref 8–27)
Bilirubin Total: 0.4 mg/dL (ref 0.0–1.2)
CO2: 27 mmol/L (ref 20–29)
Calcium: 8.8 mg/dL (ref 8.7–10.3)
Chloride: 102 mmol/L (ref 96–106)
Creatinine, Ser: 1.5 mg/dL — ABNORMAL HIGH (ref 0.57–1.00)
Globulin, Total: 2.6 g/dL (ref 1.5–4.5)
Glucose: 123 mg/dL — ABNORMAL HIGH (ref 65–99)
Potassium: 4.7 mmol/L (ref 3.5–5.2)
Sodium: 140 mmol/L (ref 134–144)
Total Protein: 5.5 g/dL — ABNORMAL LOW (ref 6.0–8.5)
eGFR: 34 mL/min/{1.73_m2} — ABNORMAL LOW (ref >59)

## 2020-11-27 LAB — CBC WITH DIFFERENTIAL/PLATELET
Basophils Absolute: 0 10*3/uL (ref 0.0–0.2)
Basos: 0 %
EOS (ABSOLUTE): 0 10*3/uL (ref 0.0–0.4)
Eos: 0 %
Hematocrit: 30.8 % — ABNORMAL LOW (ref 34.0–46.6)
Hemoglobin: 9.8 g/dL — ABNORMAL LOW (ref 11.1–15.9)
Immature Grans (Abs): 0.1 10*3/uL (ref 0.0–0.1)
Immature Granulocytes: 1 %
Lymphocytes Absolute: 0.6 10*3/uL — ABNORMAL LOW (ref 0.7–3.1)
Lymphs: 11 %
MCH: 26.6 pg (ref 26.6–33.0)
MCHC: 31.8 g/dL (ref 31.5–35.7)
MCV: 84 fL (ref 79–97)
Monocytes Absolute: 1.2 10*3/uL — ABNORMAL HIGH (ref 0.1–0.9)
Monocytes: 23 %
Neutrophils Absolute: 3.5 10*3/uL (ref 1.4–7.0)
Neutrophils: 65 %
Platelets: 195 10*3/uL (ref 150–450)
RBC: 3.68 x10E6/uL — ABNORMAL LOW (ref 3.77–5.28)
RDW: 15.6 % — ABNORMAL HIGH (ref 11.7–15.4)
WBC: 5.4 10*3/uL (ref 3.4–10.8)

## 2020-11-27 LAB — PRO B NATRIURETIC PEPTIDE: NT-Pro BNP: 10126 pg/mL — ABNORMAL HIGH (ref 0–738)

## 2020-11-27 NOTE — Telephone Encounter (Signed)
Stat labs revealed pBNP 10,126; Hgb 9.8, Hct 30.8; GFR 34. Telephoned pt's son Sherry Hess notified via phone, states he will take her immediately to [facility] ED. Telephoned Lottie Rater, spoke to triage nurse on duty to report labs and to anticipate pt's arrival.

## 2020-11-27 NOTE — Progress Notes (Signed)
Acute Office Visit  Subjective:    Patient ID: Sherry Hess, female    DOB: [birth year], 87 y.o.   MRN: [id number]  Chief Complaint  Patient presents with   Acute Renal Failure     HPI Patient is in today for hospital follow up. Pt was admitted to [facility] for AKI FOR CKD, generalized weakness, chronic respiratory failure with hypoxia secondary to possible copd, and renal mass (new.) Admitted 11/10/20 to 11/21/2020. Discharged on 1 L oxygen. Pt is set up to see urology in 12/2020. [name] feeling very weak. Denies chest pain. Complaining of lower extremity edema. No fevers, chills, sweats. We had a long discussion concerning the renal mass likely being cancer and her advance directive wishes. Pt would like all measures taken.   Past Medical History:  Diagnosis Date   Anxiety    Benign neoplasm of rectum    Benign neoplasm of sigmoid colon    Chest pain in adult 12/27/2015   CKD (chronic kidney disease) stage 4, GFR 15-29 ml/min ([place]) 07/02/2019   CVA (cerebral vascular accident) ([place]) 03/08/2017   Dyspepsia 03/09/2017   Dyspepsia 03/09/2017   Essential hypertension 11/20/2014   Fatigue 12/10/2016   High cholesterol    HLD (hyperlipidemia) 03/09/2017   Hx of transient ischemic attack (TIA) 10/18/2016   Hypertension    Hypertension, renal disease, stage 1-4 or unspecified chronic kidney disease 07/02/2019   Mixed hyperlipidemia 03/09/2017   Normocytic anemia    Occult blood positive stool 03/09/2017   Paroxysmal atrial fibrillation ([place]) 02/23/2017   Syncope 12/10/2016   TIA (transient ischemic attack)    Vascular dementia without behavioral disturbance ([place]) 07/02/2019    Past Surgical History:  Procedure Laterality Date   COLONOSCOPY N/A 03/11/2017   Procedure: COLONOSCOPY;  Surgeon: Gatha Mayer, MD;  Location: [place];  Service: Endoscopy;  Laterality: N/A;   ESOPHAGOGASTRODUODENOSCOPY N/A 03/11/2017   Procedure: ESOPHAGOGASTRODUODENOSCOPY (EGD);  Surgeon:  Gatha Mayer, MD;  Location: [facility] ENDOSCOPY;  Service: Endoscopy;  Laterality: N/A;   TONSILLECTOMY     VESICOVAGINAL FISTULA CLOSURE W/ TAH      Family History  Problem Relation Age of Onset   Cancer Father        lung   Stroke Mother    Cancer Brother        x2 bladder   Dementia Brother    Atrial fibrillation Son    Ataxia Neg Hx    Chorea Neg Hx    Mental retardation Neg Hx    Migraines Neg Hx    Multiple sclerosis Neg Hx    Neurofibromatosis Neg Hx    Neuropathy Neg Hx    Parkinsonism Neg Hx    Seizures Neg Hx     Social History   Socioeconomic History   Marital status: Widowed    Spouse name: Not on file   Number of children: Not on file   Years of education: Not on file   Highest education level: Not on file  Occupational History   Not on file  Tobacco Use   Smoking status: Never   Smokeless tobacco: Never  Vaping Use   Vaping Use: Never used  Substance and Sexual Activity   Alcohol use: No   Drug use: No   Sexual activity: Not on file  Other Topics Concern   Not on file  Social History Narrative   Not on file   Social Determinants of Health   Financial Resource Strain: Not on file  Food Insecurity: Not on file  Transportation Needs: Not on file  Physical Activity: Not on file  Stress: Not on file  Social Connections: Not on file  Intimate Partner Violence: Not on file    Outpatient Medications Prior to Visit  Medication Sig Dispense Refill   atorvastatin (LIPITOR) 20 MG tablet TAKE 1 TABLET BY MOUTH DAILY 90 tablet 0   Calcium Citrate (CAL-CITRATE PO) Take 600 mg by mouth daily.     carvedilol (COREG) 25 MG tablet      diltiazem (CARDIZEM CD) 180 MG 24 hr capsule TAKE 1 CAPSULE BY MOUTH DAILY 90 capsule 2   donepezil (ARICEPT) 5 MG tablet TAKE 1 TABLET BY MOUTH DAILY AT BEDTIME 90 tablet 1   Ferrous Sulfate (CVS SLOW RELEASE IRON PO) Take 325 mg by mouth daily.     Fish Oil-Cholecalciferol (FISH OIL + D3) 1000-1000 MG-UNIT CAPS Take by  mouth.     FLUoxetine (PROZAC) 10 MG capsule TAKE 1 CAPSULE BY MOUTH ONCE DAILY 90 capsule 1   hydrALAZINE (APRESOLINE) 50 MG tablet TAKE 1 TABLET BY MOUTH 2 TIMES DAILY 180 tablet 1   levothyroxine (SYNTHROID) 25 MCG tablet TAKE 1 TABLET BY MOUTH ONCE DAILY ON AN EMPTY STOMACH 30 MINUTES BEFORE BREAKFAST 90 tablet 0   memantine (NAMENDA) 10 MG tablet TAKE 1 TABLET BY MOUTH 2 TIMES DAILY 180 tablet 1   Multiple Vitamin (MULTIVITAMIN) tablet Take 1 tablet by mouth daily.     omeprazole (PRILOSEC) 20 MG capsule TAKE 1 CAPSULE BY MOUTH DAILY 90 capsule 3   sodium chloride 1 g tablet Take 1 g by mouth daily.     Vitamin D, Ergocalciferol, (DRISDOL) 1.25 MG (50000 UNIT) CAPS capsule Take 1 capsule (50,000 Units total) by mouth 2 (two) times a week. 24 capsule 1   No facility-administered medications prior to visit.    Allergies  Allergen Reactions   Minoxidil     rash   Norvasc [Amlodipine Besylate]     rash   Clonidine Derivatives Rash    [name]    Review of Systems  Constitutional:  Positive for fatigue. Negative for chills and fever.  HENT:  Negative for congestion, rhinorrhea and sore throat.   Respiratory:  Positive for shortness of breath. Negative for cough.   Cardiovascular:  Positive for leg swelling. Negative for chest pain and palpitations.  Gastrointestinal:  Negative for abdominal pain, constipation, diarrhea, nausea and vomiting.  Genitourinary:  Negative for dysuria and urgency.  Musculoskeletal:  Negative for back pain and myalgias.  Neurological:  Negative for dizziness, weakness, light-headedness and headaches.  Psychiatric/Behavioral:  Negative for dysphoric mood. The patient is not nervous/anxious.       Objective:    Physical Exam Vitals reviewed.  Constitutional:      Comments: Tired appearing  Eyes:     Comments: Conjunctivae pale.  Neck:     Vascular: No carotid bruit.  Cardiovascular:     Rate and Rhythm: Normal rate. Rhythm irregular.      Heart sounds: Normal heart sounds.  Pulmonary:     Effort: Pulmonary effort is normal.     Breath sounds: Normal breath sounds.     Comments: Using oxygen 1 L pnc. Abdominal:     Palpations: Abdomen is soft.     Tenderness: There is no abdominal tenderness.  Musculoskeletal:     Right lower leg: Edema present.     Left lower leg: Edema present.  Skin:    Coloration: Skin  is pale.  Neurological:     Mental Status: [name] alert and oriented to person, place, and time.  Psychiatric:        Mood and Affect: Mood normal.        Behavior: Behavior normal.    BP 110/62   Pulse 76   Temp 97.8 F (36.6 C)   Resp 20   Ht 5' 4" (1.626 m)   Wt 148 lb (67.1 kg)   BMI 25.40 kg/m  Wt Readings from Last 3 Encounters:  11/27/20 148 lb (67.1 kg)  11/07/20 132 lb (59.9 kg)  09/30/20 135 lb (61.2 kg)    Health Maintenance Due  Topic Date Due   Zoster Vaccines- Shingrix (1 of 2) Never done    There are no preventive care reminders to display for this patient.   Lab Results  Component Value Date   TSH 3.900 09/30/2020   Lab Results  Component Value Date   WBC 5.4 11/27/2020   HGB 9.8 (L) 11/27/2020   HCT 30.8 (L) 11/27/2020   MCV 84 11/27/2020   PLT 195 11/27/2020   Lab Results  Component Value Date   NA 140 11/27/2020   K 4.7 11/27/2020   CO2 27 11/27/2020   GLUCOSE 123 (H) 11/27/2020   BUN 23 11/27/2020   CREATININE 1.50 (H) 11/27/2020   BILITOT 0.4 11/27/2020   ALKPHOS 62 11/27/2020   AST 11 11/27/2020   ALT 10 11/27/2020   PROT 5.5 (L) 11/27/2020   ALBUMIN 2.9 (L) 11/27/2020   CALCIUM 8.8 11/27/2020   ANIONGAP 13 03/12/2017   EGFR 34 (L) 11/27/2020   Lab Results  Component Value Date   CHOL 99 (L) 09/30/2020   Lab Results  Component Value Date   HDL 33 (L) 09/30/2020   Lab Results  Component Value Date   LDLCALC 52 09/30/2020   Lab Results  Component Value Date   TRIG 63 09/30/2020   Lab Results  Component Value Date   CHOLHDL 3.0  09/30/2020   Lab Results  Component Value Date   HGBA1C 5.2 03/09/2017       Assessment & Plan:  1. Shortness of breath - Comprehensive metabolic panel stat - Pro b natriuretic peptide (BNP) stat  2. Anemia due to stage 4 chronic kidney disease (HCC) - CBC with Differential/Platelet stat  3. Acute on chronic diastolic heart failure ([place]) Initially I was concerned that her severe weakness was due to anemia due to her pallor, but  labs came back and her probnp was 10000+. Pt was sent to the ED around 10 pm.   4. Chronic respiratory failure with hypoxia (HCC) Continue oxygen at 2 L  5. Left renal mass Discussed in depth with pt and her son. I also spoke with Dr. Nila Nephew after hours and based on labs above, he will try to see her sooner. At this point I do not believe she would do well with surgical resection. I do not believe she would tolerate surgery.  At this point she would like all measures taken. I did ask that her son and his siblings discuss this with each other and the patient and let me know if this changes.   6. Hypertensive kidney disease with chronic kidney disease stage IV ([place]) CKD improved on labs taken at this visit.  BP is well controlled.  7. Chronic atrial fibrillation (HCC) Blood thinner was held upon discharge due to concern for the renal mass bleeding.  Currently in rate controlled atrial  fibrillation.  8. Vascular dementia without behavioral disturbance (HCC)  MMSE 27/30.  Currently on lipitor, aricept, and namenda.   9. Advance directive discussed.  Pt is a full code at this time. Family to discuss over the weekned. No orders of the defined types were placed in this encounter.   Orders Placed This Encounter  Procedures   CBC with Differential/Platelet   Comprehensive metabolic panel   Pro b natriuretic peptide (BNP)     I spent 40 minutes dedicated to the care of this patient on the date of this encounter to include face-to-face time with the  patient, as well as: chart review and communication with specialists and the ED.  Follow-up: Return if symptoms worsen or fail to improve, Follow up appt to be made based on lab results, etc..  An After Visit Summary was printed and given to the patient.  [name], MD  Family Practice [phone]

## 2020-11-28 ENCOUNTER — Ambulatory Visit (INDEPENDENT_AMBULATORY_CARE_PROVIDER_SITE_OTHER): Payer: Medicare Other

## 2020-11-28 DIAGNOSIS — E7800 Pure hypercholesterolemia, unspecified: Secondary | ICD-10-CM | POA: Diagnosis not present

## 2020-11-28 DIAGNOSIS — I480 Paroxysmal atrial fibrillation: Secondary | ICD-10-CM | POA: Diagnosis not present

## 2020-11-28 DIAGNOSIS — N189 Chronic kidney disease, unspecified: Secondary | ICD-10-CM | POA: Diagnosis not present

## 2020-11-28 DIAGNOSIS — E860 Dehydration: Secondary | ICD-10-CM | POA: Diagnosis not present

## 2020-11-28 DIAGNOSIS — Z8673 Personal history of transient ischemic attack (TIA), and cerebral infarction without residual deficits: Secondary | ICD-10-CM | POA: Diagnosis not present

## 2020-11-28 DIAGNOSIS — M19079 Primary osteoarthritis, unspecified ankle and foot: Secondary | ICD-10-CM | POA: Diagnosis not present

## 2020-11-28 DIAGNOSIS — N3000 Acute cystitis without hematuria: Secondary | ICD-10-CM

## 2020-11-28 DIAGNOSIS — F0390 Unspecified dementia without behavioral disturbance: Secondary | ICD-10-CM | POA: Diagnosis not present

## 2020-11-28 DIAGNOSIS — N2889 Other specified disorders of kidney and ureter: Secondary | ICD-10-CM | POA: Diagnosis not present

## 2020-11-28 DIAGNOSIS — F419 Anxiety disorder, unspecified: Secondary | ICD-10-CM | POA: Diagnosis not present

## 2020-11-28 DIAGNOSIS — D509 Iron deficiency anemia, unspecified: Secondary | ICD-10-CM | POA: Diagnosis not present

## 2020-11-28 DIAGNOSIS — F32A Depression, unspecified: Secondary | ICD-10-CM | POA: Diagnosis not present

## 2020-11-28 DIAGNOSIS — I129 Hypertensive chronic kidney disease with stage 1 through stage 4 chronic kidney disease, or unspecified chronic kidney disease: Secondary | ICD-10-CM | POA: Diagnosis not present

## 2020-11-28 DIAGNOSIS — N170 Acute kidney failure with tubular necrosis: Secondary | ICD-10-CM | POA: Diagnosis not present

## 2020-11-28 DIAGNOSIS — J449 Chronic obstructive pulmonary disease, unspecified: Secondary | ICD-10-CM | POA: Diagnosis not present

## 2020-11-28 DIAGNOSIS — Z9981 Dependence on supplemental oxygen: Secondary | ICD-10-CM | POA: Diagnosis not present

## 2020-11-28 DIAGNOSIS — I080 Rheumatic disorders of both mitral and aortic valves: Secondary | ICD-10-CM | POA: Diagnosis not present

## 2020-11-28 LAB — POCT URINALYSIS DIP (CLINITEK)
Bilirubin, UA: NEGATIVE
Blood, UA: NEGATIVE
Glucose, UA: NEGATIVE mg/dL
Ketones, POC UA: NEGATIVE mg/dL
Leukocytes, UA: NEGATIVE
Nitrite, UA: NEGATIVE
POC PROTEIN,UA: NEGATIVE
Spec Grav, UA: 1.015 (ref 1.010–1.025)
Urobilinogen, UA: 0.2 E.U./dL
pH, UA: 6 (ref 5.0–8.0)

## 2020-11-28 NOTE — Progress Notes (Signed)
      Patient's son brought in a urine to be rechecked for infection.  It was negative.  Patient's son made aware.  Shelle Iron, LPN 81/15/72 62:03 PM

## 2020-12-02 DIAGNOSIS — F419 Anxiety disorder, unspecified: Secondary | ICD-10-CM | POA: Diagnosis not present

## 2020-12-02 DIAGNOSIS — I080 Rheumatic disorders of both mitral and aortic valves: Secondary | ICD-10-CM | POA: Diagnosis not present

## 2020-12-02 DIAGNOSIS — F0390 Unspecified dementia without behavioral disturbance: Secondary | ICD-10-CM | POA: Diagnosis not present

## 2020-12-02 DIAGNOSIS — Z9981 Dependence on supplemental oxygen: Secondary | ICD-10-CM | POA: Diagnosis not present

## 2020-12-02 DIAGNOSIS — E860 Dehydration: Secondary | ICD-10-CM | POA: Diagnosis not present

## 2020-12-02 DIAGNOSIS — I480 Paroxysmal atrial fibrillation: Secondary | ICD-10-CM | POA: Diagnosis not present

## 2020-12-02 DIAGNOSIS — I129 Hypertensive chronic kidney disease with stage 1 through stage 4 chronic kidney disease, or unspecified chronic kidney disease: Secondary | ICD-10-CM | POA: Diagnosis not present

## 2020-12-02 DIAGNOSIS — Z8673 Personal history of transient ischemic attack (TIA), and cerebral infarction without residual deficits: Secondary | ICD-10-CM | POA: Diagnosis not present

## 2020-12-02 DIAGNOSIS — M19079 Primary osteoarthritis, unspecified ankle and foot: Secondary | ICD-10-CM | POA: Diagnosis not present

## 2020-12-02 DIAGNOSIS — N170 Acute kidney failure with tubular necrosis: Secondary | ICD-10-CM | POA: Diagnosis not present

## 2020-12-02 DIAGNOSIS — F32A Depression, unspecified: Secondary | ICD-10-CM | POA: Diagnosis not present

## 2020-12-02 DIAGNOSIS — D509 Iron deficiency anemia, unspecified: Secondary | ICD-10-CM | POA: Diagnosis not present

## 2020-12-02 DIAGNOSIS — J449 Chronic obstructive pulmonary disease, unspecified: Secondary | ICD-10-CM | POA: Diagnosis not present

## 2020-12-02 DIAGNOSIS — N2889 Other specified disorders of kidney and ureter: Secondary | ICD-10-CM | POA: Diagnosis not present

## 2020-12-02 DIAGNOSIS — E7800 Pure hypercholesterolemia, unspecified: Secondary | ICD-10-CM | POA: Diagnosis not present

## 2020-12-02 DIAGNOSIS — N189 Chronic kidney disease, unspecified: Secondary | ICD-10-CM | POA: Diagnosis not present

## 2020-12-04 ENCOUNTER — Other Ambulatory Visit: Payer: Self-pay

## 2020-12-04 DIAGNOSIS — E7800 Pure hypercholesterolemia, unspecified: Secondary | ICD-10-CM | POA: Diagnosis not present

## 2020-12-04 DIAGNOSIS — N2889 Other specified disorders of kidney and ureter: Secondary | ICD-10-CM | POA: Diagnosis not present

## 2020-12-04 DIAGNOSIS — F0390 Unspecified dementia without behavioral disturbance: Secondary | ICD-10-CM | POA: Diagnosis not present

## 2020-12-04 DIAGNOSIS — M19079 Primary osteoarthritis, unspecified ankle and foot: Secondary | ICD-10-CM | POA: Diagnosis not present

## 2020-12-04 DIAGNOSIS — N189 Chronic kidney disease, unspecified: Secondary | ICD-10-CM | POA: Diagnosis not present

## 2020-12-04 DIAGNOSIS — J449 Chronic obstructive pulmonary disease, unspecified: Secondary | ICD-10-CM | POA: Diagnosis not present

## 2020-12-04 DIAGNOSIS — Z8673 Personal history of transient ischemic attack (TIA), and cerebral infarction without residual deficits: Secondary | ICD-10-CM | POA: Diagnosis not present

## 2020-12-04 DIAGNOSIS — F32A Depression, unspecified: Secondary | ICD-10-CM | POA: Diagnosis not present

## 2020-12-04 DIAGNOSIS — D509 Iron deficiency anemia, unspecified: Secondary | ICD-10-CM | POA: Diagnosis not present

## 2020-12-04 DIAGNOSIS — E860 Dehydration: Secondary | ICD-10-CM | POA: Diagnosis not present

## 2020-12-04 DIAGNOSIS — N170 Acute kidney failure with tubular necrosis: Secondary | ICD-10-CM | POA: Diagnosis not present

## 2020-12-04 DIAGNOSIS — I480 Paroxysmal atrial fibrillation: Secondary | ICD-10-CM | POA: Diagnosis not present

## 2020-12-04 DIAGNOSIS — Z9981 Dependence on supplemental oxygen: Secondary | ICD-10-CM | POA: Diagnosis not present

## 2020-12-04 DIAGNOSIS — I129 Hypertensive chronic kidney disease with stage 1 through stage 4 chronic kidney disease, or unspecified chronic kidney disease: Secondary | ICD-10-CM | POA: Diagnosis not present

## 2020-12-04 DIAGNOSIS — I080 Rheumatic disorders of both mitral and aortic valves: Secondary | ICD-10-CM | POA: Diagnosis not present

## 2020-12-04 DIAGNOSIS — F419 Anxiety disorder, unspecified: Secondary | ICD-10-CM | POA: Diagnosis not present

## 2020-12-05 DIAGNOSIS — J449 Chronic obstructive pulmonary disease, unspecified: Secondary | ICD-10-CM | POA: Diagnosis not present

## 2020-12-05 DIAGNOSIS — E7800 Pure hypercholesterolemia, unspecified: Secondary | ICD-10-CM | POA: Diagnosis not present

## 2020-12-05 DIAGNOSIS — I480 Paroxysmal atrial fibrillation: Secondary | ICD-10-CM | POA: Diagnosis not present

## 2020-12-05 DIAGNOSIS — I080 Rheumatic disorders of both mitral and aortic valves: Secondary | ICD-10-CM | POA: Diagnosis not present

## 2020-12-05 DIAGNOSIS — Z9981 Dependence on supplemental oxygen: Secondary | ICD-10-CM | POA: Diagnosis not present

## 2020-12-05 DIAGNOSIS — I129 Hypertensive chronic kidney disease with stage 1 through stage 4 chronic kidney disease, or unspecified chronic kidney disease: Secondary | ICD-10-CM | POA: Diagnosis not present

## 2020-12-05 DIAGNOSIS — Z8673 Personal history of transient ischemic attack (TIA), and cerebral infarction without residual deficits: Secondary | ICD-10-CM | POA: Diagnosis not present

## 2020-12-05 DIAGNOSIS — E860 Dehydration: Secondary | ICD-10-CM | POA: Diagnosis not present

## 2020-12-05 DIAGNOSIS — N2889 Other specified disorders of kidney and ureter: Secondary | ICD-10-CM | POA: Diagnosis not present

## 2020-12-05 DIAGNOSIS — F419 Anxiety disorder, unspecified: Secondary | ICD-10-CM | POA: Diagnosis not present

## 2020-12-05 DIAGNOSIS — M19079 Primary osteoarthritis, unspecified ankle and foot: Secondary | ICD-10-CM | POA: Diagnosis not present

## 2020-12-05 DIAGNOSIS — N170 Acute kidney failure with tubular necrosis: Secondary | ICD-10-CM | POA: Diagnosis not present

## 2020-12-05 DIAGNOSIS — F32A Depression, unspecified: Secondary | ICD-10-CM | POA: Diagnosis not present

## 2020-12-05 DIAGNOSIS — F0390 Unspecified dementia without behavioral disturbance: Secondary | ICD-10-CM | POA: Diagnosis not present

## 2020-12-05 DIAGNOSIS — D509 Iron deficiency anemia, unspecified: Secondary | ICD-10-CM | POA: Diagnosis not present

## 2020-12-05 DIAGNOSIS — N189 Chronic kidney disease, unspecified: Secondary | ICD-10-CM | POA: Diagnosis not present

## 2020-12-08 ENCOUNTER — Ambulatory Visit: Payer: Medicare Other | Admitting: Cardiology

## 2020-12-08 ENCOUNTER — Other Ambulatory Visit: Payer: Self-pay

## 2020-12-08 VITALS — BP 90/54 | HR 70 | Ht 63.0 in | Wt 141.0 lb

## 2020-12-08 DIAGNOSIS — R0602 Shortness of breath: Secondary | ICD-10-CM

## 2020-12-08 DIAGNOSIS — I639 Cerebral infarction, unspecified: Secondary | ICD-10-CM

## 2020-12-08 DIAGNOSIS — E782 Mixed hyperlipidemia: Secondary | ICD-10-CM

## 2020-12-08 DIAGNOSIS — I480 Paroxysmal atrial fibrillation: Secondary | ICD-10-CM

## 2020-12-08 DIAGNOSIS — I10 Essential (primary) hypertension: Secondary | ICD-10-CM

## 2020-12-08 NOTE — Patient Instructions (Signed)
Medication Instructions:  Your physician recommends that you continue on your current medications as directed. Please refer to the Current Medication list given to you today.  *If you need a refill on your cardiac medications before your next appointment, please call your pharmacy*   Lab Work: Your physician recommends that you return for lab work in: TODAY CBC, BMP, ProBNP If you have labs (blood work) drawn today and your tests are completely normal, you will receive your results only by: [place] (if you have [place]) OR A paper copy in the mail If you have any lab test that is abnormal or we need to change your treatment, we will call you to review the results.   Testing/Procedures: None   Follow-Up: At [facility], you and your health needs are our priority.  As part of our continuing mission to provide you with exceptional heart care, we have created designated Provider Care Teams.  These Care Teams include your primary Cardiologist (physician) and Advanced Practice Providers (APPs -  Physician Assistants and Nurse Practitioners) who all work together to provide you with the care you need, when you need it.  We recommend signing up for the patient portal called "MyChart".  Sign up information is provided on this After Visit Summary.  MyChart is used to connect with patients for Virtual Visits (Telemedicine).  Patients are able to view lab/test results, encounter notes, upcoming appointments, etc.  Non-urgent messages can be sent to your provider as well.   To learn more about what you can do with MyChart, go to [email or web address].    Your next appointment:   3 month(s)  The format for your next appointment:   In Person  Provider:   [name], MD   Other Instructions

## 2020-12-08 NOTE — Progress Notes (Signed)
Cardiology Office Note:    Date:  12/08/2020   ID:  Sherry Hess, DOB July 26, 1930, MRN [id number]  PCP:  [name], MD  Cardiologist:  [name], MD    Referring MD: [name], MD   Chief Complaint  Patient presents with   Hospitalization Follow-up    History of Present Illness:    Sherry Hess is a 87 y.o. female with past medical history significant for paroxysmal atrial fibrillation, she was anticoagulant Eliquis however Eliquis has been discontinued during last hospitalization because of anemia as well as renal mass, history of TIA, atypical chest pain, essential hypertension, dyslipidemia.  [name] coming today to my office for follow-up.  Recently she ended up being in the hospital because of weakness fatigue and tiredness.  She was discovered to have a renal mass and work-up is in progress.  [name] scheduled to see urology next week.  Concern is for malignancy.  Also she end up in the emergency room a couple weeks later which was about 2 weeks ago she was felt to be overloaded with fluid and 5 dosages of diuretic has been given she took dose lost about 10 pounds and she is started feeling better.  Since the time she finished her diuretic she gained about 3 pounds.  Denies have any chest pain tightness squeezing pressure burning chest she overall looks very pale.  There is no palpitations there is no pain.  There is minimal swelling of lower extremities  Past Medical History:  Diagnosis Date   Anxiety    Benign neoplasm of rectum    Benign neoplasm of sigmoid colon    Chest pain in adult 12/27/2015   CKD (chronic kidney disease) stage 4, GFR 15-29 ml/min ([place]) 07/02/2019   CVA (cerebral vascular accident) ([place]) 03/08/2017   Dyspepsia 03/09/2017   Dyspepsia 03/09/2017   Essential hypertension 11/20/2014   Fatigue 12/10/2016   High cholesterol    HLD (hyperlipidemia) 03/09/2017   Hx of transient ischemic attack (TIA) 10/18/2016   Hypertension    Hypertension,  renal disease, stage 1-4 or unspecified chronic kidney disease 07/02/2019   Mixed hyperlipidemia 03/09/2017   Normocytic anemia    Occult blood positive stool 03/09/2017   Paroxysmal atrial fibrillation ([place]) 02/23/2017   Syncope 12/10/2016   TIA (transient ischemic attack)    Vascular dementia without behavioral disturbance ([place]) 07/02/2019    Past Surgical History:  Procedure Laterality Date   COLONOSCOPY N/A 03/11/2017   Procedure: COLONOSCOPY;  Surgeon: Gatha Mayer, MD;  Location: [place];  Service: Endoscopy;  Laterality: N/A;   ESOPHAGOGASTRODUODENOSCOPY N/A 03/11/2017   Procedure: ESOPHAGOGASTRODUODENOSCOPY (EGD);  Surgeon: Gatha Mayer, MD;  Location: [facility] ENDOSCOPY;  Service: Endoscopy;  Laterality: N/A;   TONSILLECTOMY     VESICOVAGINAL FISTULA CLOSURE W/ TAH      Current Medications: Current Meds  Medication Sig   atorvastatin (LIPITOR) 20 MG tablet TAKE 1 TABLET BY MOUTH DAILY (Patient taking differently: Take 20 mg by mouth daily.)   Calcium Citrate (CAL-CITRATE PO) Take 600 mg by mouth daily.   carvedilol (COREG) 25 MG tablet Take 25 mg by mouth 2 (two) times daily with a meal.   diltiazem (CARDIZEM CD) 180 MG 24 hr capsule TAKE 1 CAPSULE BY MOUTH DAILY (Patient taking differently: Take 180 mg by mouth daily.)   donepezil (ARICEPT) 5 MG tablet TAKE 1 TABLET BY MOUTH DAILY AT BEDTIME (Patient taking differently: Take 5 mg by mouth at bedtime.)   Ferrous Sulfate (CVS  SLOW RELEASE IRON PO) Take 325 mg by mouth daily.   Fish Oil-Cholecalciferol (FISH OIL + D3) 1000-1000 MG-UNIT CAPS Take 1 capsule by mouth daily.   FLUoxetine (PROZAC) 10 MG capsule TAKE 1 CAPSULE BY MOUTH ONCE DAILY (Patient taking differently: Take 10 mg by mouth daily.)   furosemide (LASIX) 20 MG tablet Take 20 mg by mouth daily. Given 5 pills at the hospital   hydrALAZINE (APRESOLINE) 50 MG tablet TAKE 1 TABLET BY MOUTH 2 TIMES DAILY (Patient taking differently: Take 50 mg by mouth 2 (two) times  daily.)   levothyroxine (SYNTHROID) 25 MCG tablet TAKE 1 TABLET BY MOUTH ONCE DAILY ON AN EMPTY STOMACH 30 MINUTES BEFORE BREAKFAST (Patient taking differently: Take 25 mcg by mouth daily before breakfast.)   memantine (NAMENDA) 10 MG tablet TAKE 1 TABLET BY MOUTH 2 TIMES DAILY (Patient taking differently: Take 10 mg by mouth 2 (two) times daily.)   Multiple Vitamin (MULTIVITAMIN) tablet Take 1 tablet by mouth daily. Unknown strength   omeprazole (PRILOSEC) 20 MG capsule TAKE 1 CAPSULE BY MOUTH DAILY (Patient taking differently: Take 20 mg by mouth daily.)   potassium chloride (KLOR-CON) 10 MEQ tablet Take 10 mEq by mouth daily.   sodium chloride 1 g tablet Take 1 g by mouth daily.   Vitamin D, Ergocalciferol, (DRISDOL) 1.25 MG (50000 UNIT) CAPS capsule Take 1 capsule (50,000 Units total) by mouth 2 (two) times a week.     Allergies:   Minoxidil, Norvasc [amlodipine besylate], and Clonidine derivatives   Social History   Socioeconomic History   Marital status: Widowed    Spouse name: Not on file   Number of children: Not on file   Years of education: Not on file   Highest education level: Not on file  Occupational History   Not on file  Tobacco Use   Smoking status: Never   Smokeless tobacco: Never  Vaping Use   Vaping Use: Never used  Substance and Sexual Activity   Alcohol use: No   Drug use: No   Sexual activity: Not on file  Other Topics Concern   Not on file  Social History Narrative   Not on file   Social Determinants of Health   Financial Resource Strain: Not on file  Food Insecurity: Not on file  Transportation Needs: Not on file  Physical Activity: Not on file  Stress: Not on file  Social Connections: Not on file     Family History: [name] family history includes Atrial fibrillation in her son; Cancer in her brother and father; Dementia in her brother; Stroke in her mother. There is no history of Ataxia, Chorea, Mental retardation, Migraines, Multiple  sclerosis, Neurofibromatosis, Neuropathy, Parkinsonism, or Seizures. ROS:   Please see the history of present illness.    All 14 point review of systems negative except as described per history of present illness  EKGs/Labs/Other Studies Reviewed:      Recent Labs: 09/30/2020: TSH 3.900 11/27/2020: ALT 10; BUN 23; Creatinine, Ser 1.50; Hemoglobin 9.8; NT-Pro BNP 10,126; Platelets 195; Potassium 4.7; Sodium 140  Recent Lipid Panel    Component Value Date/Time   CHOL 99 (L) 09/30/2020 1109   TRIG 63 09/30/2020 1109   HDL 33 (L) 09/30/2020 1109   CHOLHDL 3.0 09/30/2020 1109   CHOLHDL 3.3 03/09/2017 0226   VLDL 10 03/09/2017 0226   LDLCALC 52 09/30/2020 1109    Physical Exam:    VS:  BP (!) 90/54 (BP Location: Right Arm, Patient Position: Sitting)  Pulse 70   Ht 5\' 3"  (1.6 m)   Wt 141 lb (64 kg)   SpO2 97%   BMI 24.98 kg/m     Wt Readings from Last 3 Encounters:  12/08/20 141 lb (64 kg)  11/27/20 148 lb (67.1 kg)  11/07/20 132 lb (59.9 kg)     GEN:  Well nourished, well developed in no acute distress HEENT: Normal NECK: No JVD; No carotid bruits LYMPHATICS: No lymphadenopathy CARDIAC: RRR, no murmurs, no rubs, no gallops RESPIRATORY:  Clear to auscultation without rales, wheezing or rhonchi  ABDOMEN: Soft, non-tender, non-distended MUSCULOSKELETAL:  No edema; No deformity  SKIN: Warm and dry LOWER EXTREMITIES: no swelling NEUROLOGIC:  Alert and oriented x 3 PSYCHIATRIC:  Normal affect   ASSESSMENT:    1. Shortness of breath   2. Essential hypertension   3. Paroxysmal atrial fibrillation (HCC)   4. Cerebrovascular accident (CVA), unspecified mechanism ([place])   5. Mixed hyperlipidemia    PLAN:    In order of problems listed above:  Shortness of breath I will check proBNP today echocardiogram reviewed done just few months ago showing preserved left ventricle ejection fraction. Essential hypertension blood pressure actually is on the lower side.  We will  continue present management. Paroxysmal atrial fibrillation seems to maintain sinus rhythm.  [name] regular on the physical exam.  Off anticoagulation because of renal mass with suspicion for malignancy. History of TIA.  None recently.  Mixed dyslipidemia: Her LDL is 52 HDL 33 this is reviewing her data from [facility] from 09/30/2020.  We will continue present management which includes atorvastatin. I am very concerned about her.  She look sick.  Chronically sick there is, we will check her CBC today Chem-7 as well as proBNP.  Based on that we decide if she needs to have any diuretic.   Medication Adjustments/Labs and Tests Ordered: Current medicines are reviewed at length with the patient today.  Concerns regarding medicines are outlined above.  Orders Placed This Encounter  Procedures   Pro b natriuretic peptide (BNP)   Basic metabolic panel   CBC   Medication changes: No orders of the defined types were placed in this encounter.   Signed, Park Liter, MD, [facility] 12/08/2020 10:25 AM    [place]

## 2020-12-09 ENCOUNTER — Other Ambulatory Visit: Payer: Self-pay

## 2020-12-09 DIAGNOSIS — D509 Iron deficiency anemia, unspecified: Secondary | ICD-10-CM | POA: Diagnosis not present

## 2020-12-09 DIAGNOSIS — J449 Chronic obstructive pulmonary disease, unspecified: Secondary | ICD-10-CM | POA: Diagnosis not present

## 2020-12-09 DIAGNOSIS — M19079 Primary osteoarthritis, unspecified ankle and foot: Secondary | ICD-10-CM | POA: Diagnosis not present

## 2020-12-09 DIAGNOSIS — E860 Dehydration: Secondary | ICD-10-CM | POA: Diagnosis not present

## 2020-12-09 DIAGNOSIS — F32A Depression, unspecified: Secondary | ICD-10-CM | POA: Diagnosis not present

## 2020-12-09 DIAGNOSIS — F0390 Unspecified dementia without behavioral disturbance: Secondary | ICD-10-CM | POA: Diagnosis not present

## 2020-12-09 DIAGNOSIS — F419 Anxiety disorder, unspecified: Secondary | ICD-10-CM | POA: Diagnosis not present

## 2020-12-09 DIAGNOSIS — Z79899 Other long term (current) drug therapy: Secondary | ICD-10-CM

## 2020-12-09 DIAGNOSIS — I480 Paroxysmal atrial fibrillation: Secondary | ICD-10-CM | POA: Diagnosis not present

## 2020-12-09 DIAGNOSIS — I129 Hypertensive chronic kidney disease with stage 1 through stage 4 chronic kidney disease, or unspecified chronic kidney disease: Secondary | ICD-10-CM | POA: Diagnosis not present

## 2020-12-09 DIAGNOSIS — N189 Chronic kidney disease, unspecified: Secondary | ICD-10-CM | POA: Diagnosis not present

## 2020-12-09 DIAGNOSIS — N170 Acute kidney failure with tubular necrosis: Secondary | ICD-10-CM | POA: Diagnosis not present

## 2020-12-09 DIAGNOSIS — I080 Rheumatic disorders of both mitral and aortic valves: Secondary | ICD-10-CM | POA: Diagnosis not present

## 2020-12-09 DIAGNOSIS — Z8673 Personal history of transient ischemic attack (TIA), and cerebral infarction without residual deficits: Secondary | ICD-10-CM | POA: Diagnosis not present

## 2020-12-09 DIAGNOSIS — E7800 Pure hypercholesterolemia, unspecified: Secondary | ICD-10-CM | POA: Diagnosis not present

## 2020-12-09 DIAGNOSIS — Z9981 Dependence on supplemental oxygen: Secondary | ICD-10-CM | POA: Diagnosis not present

## 2020-12-09 DIAGNOSIS — N2889 Other specified disorders of kidney and ureter: Secondary | ICD-10-CM | POA: Diagnosis not present

## 2020-12-09 LAB — BASIC METABOLIC PANEL
BUN/Creatinine Ratio: 12 (ref 12–28)
BUN: 21 mg/dL (ref 8–27)
CO2: 29 mmol/L (ref 20–29)
Calcium: 8.5 mg/dL — ABNORMAL LOW (ref 8.7–10.3)
Chloride: 100 mmol/L (ref 96–106)
Creatinine, Ser: 1.72 mg/dL — ABNORMAL HIGH (ref 0.57–1.00)
Glucose: 105 mg/dL — ABNORMAL HIGH (ref 65–99)
Potassium: 4.3 mmol/L (ref 3.5–5.2)
Sodium: 141 mmol/L (ref 134–144)
eGFR: 28 mL/min/{1.73_m2} — ABNORMAL LOW (ref >59)

## 2020-12-09 LAB — CBC
Hematocrit: 29 % — ABNORMAL LOW (ref 34.0–46.6)
Hemoglobin: 9.5 g/dL — ABNORMAL LOW (ref 11.1–15.9)
MCH: 28.1 pg (ref 26.6–33.0)
MCHC: 32.8 g/dL (ref 31.5–35.7)
MCV: 86 fL (ref 79–97)
Platelets: 236 10*3/uL (ref 150–450)
RBC: 3.38 x10E6/uL — ABNORMAL LOW (ref 3.77–5.28)
RDW: 16.1 % — ABNORMAL HIGH (ref 11.7–15.4)
WBC: 3.9 10*3/uL (ref 3.4–10.8)

## 2020-12-09 LAB — PRO B NATRIURETIC PEPTIDE: NT-Pro BNP: 11664 pg/mL — ABNORMAL HIGH (ref 0–738)

## 2020-12-09 MED ORDER — FUROSEMIDE 20 MG PO TABS: 20.0000 mg | ORAL_TABLET | Freq: Every day | ORAL | 3 refills | Status: AC

## 2020-12-09 NOTE — Progress Notes (Signed)
Prescription sent to pharmacy.

## 2020-12-10 DIAGNOSIS — F32A Depression, unspecified: Secondary | ICD-10-CM | POA: Diagnosis not present

## 2020-12-10 DIAGNOSIS — D509 Iron deficiency anemia, unspecified: Secondary | ICD-10-CM | POA: Diagnosis not present

## 2020-12-10 DIAGNOSIS — N189 Chronic kidney disease, unspecified: Secondary | ICD-10-CM | POA: Diagnosis not present

## 2020-12-10 DIAGNOSIS — I080 Rheumatic disorders of both mitral and aortic valves: Secondary | ICD-10-CM | POA: Diagnosis not present

## 2020-12-10 DIAGNOSIS — I480 Paroxysmal atrial fibrillation: Secondary | ICD-10-CM | POA: Diagnosis not present

## 2020-12-10 DIAGNOSIS — N170 Acute kidney failure with tubular necrosis: Secondary | ICD-10-CM | POA: Diagnosis not present

## 2020-12-10 DIAGNOSIS — M19079 Primary osteoarthritis, unspecified ankle and foot: Secondary | ICD-10-CM | POA: Diagnosis not present

## 2020-12-10 DIAGNOSIS — E7800 Pure hypercholesterolemia, unspecified: Secondary | ICD-10-CM | POA: Diagnosis not present

## 2020-12-10 DIAGNOSIS — N2889 Other specified disorders of kidney and ureter: Secondary | ICD-10-CM | POA: Diagnosis not present

## 2020-12-10 DIAGNOSIS — J449 Chronic obstructive pulmonary disease, unspecified: Secondary | ICD-10-CM | POA: Diagnosis not present

## 2020-12-10 DIAGNOSIS — Z8673 Personal history of transient ischemic attack (TIA), and cerebral infarction without residual deficits: Secondary | ICD-10-CM | POA: Diagnosis not present

## 2020-12-10 DIAGNOSIS — F0390 Unspecified dementia without behavioral disturbance: Secondary | ICD-10-CM | POA: Diagnosis not present

## 2020-12-10 DIAGNOSIS — I129 Hypertensive chronic kidney disease with stage 1 through stage 4 chronic kidney disease, or unspecified chronic kidney disease: Secondary | ICD-10-CM | POA: Diagnosis not present

## 2020-12-10 DIAGNOSIS — Z9981 Dependence on supplemental oxygen: Secondary | ICD-10-CM | POA: Diagnosis not present

## 2020-12-10 DIAGNOSIS — E860 Dehydration: Secondary | ICD-10-CM | POA: Diagnosis not present

## 2020-12-10 DIAGNOSIS — F419 Anxiety disorder, unspecified: Secondary | ICD-10-CM | POA: Diagnosis not present

## 2020-12-11 DIAGNOSIS — E7800 Pure hypercholesterolemia, unspecified: Secondary | ICD-10-CM | POA: Diagnosis not present

## 2020-12-11 DIAGNOSIS — I129 Hypertensive chronic kidney disease with stage 1 through stage 4 chronic kidney disease, or unspecified chronic kidney disease: Secondary | ICD-10-CM | POA: Diagnosis not present

## 2020-12-11 DIAGNOSIS — N170 Acute kidney failure with tubular necrosis: Secondary | ICD-10-CM | POA: Diagnosis not present

## 2020-12-11 DIAGNOSIS — F419 Anxiety disorder, unspecified: Secondary | ICD-10-CM | POA: Diagnosis not present

## 2020-12-11 DIAGNOSIS — M19079 Primary osteoarthritis, unspecified ankle and foot: Secondary | ICD-10-CM | POA: Diagnosis not present

## 2020-12-11 DIAGNOSIS — I080 Rheumatic disorders of both mitral and aortic valves: Secondary | ICD-10-CM | POA: Diagnosis not present

## 2020-12-11 DIAGNOSIS — Z9981 Dependence on supplemental oxygen: Secondary | ICD-10-CM | POA: Diagnosis not present

## 2020-12-11 DIAGNOSIS — N189 Chronic kidney disease, unspecified: Secondary | ICD-10-CM | POA: Diagnosis not present

## 2020-12-11 DIAGNOSIS — J449 Chronic obstructive pulmonary disease, unspecified: Secondary | ICD-10-CM | POA: Diagnosis not present

## 2020-12-11 DIAGNOSIS — E860 Dehydration: Secondary | ICD-10-CM | POA: Diagnosis not present

## 2020-12-11 DIAGNOSIS — I480 Paroxysmal atrial fibrillation: Secondary | ICD-10-CM | POA: Diagnosis not present

## 2020-12-11 DIAGNOSIS — D509 Iron deficiency anemia, unspecified: Secondary | ICD-10-CM | POA: Diagnosis not present

## 2020-12-11 DIAGNOSIS — F32A Depression, unspecified: Secondary | ICD-10-CM | POA: Diagnosis not present

## 2020-12-11 DIAGNOSIS — Z8673 Personal history of transient ischemic attack (TIA), and cerebral infarction without residual deficits: Secondary | ICD-10-CM | POA: Diagnosis not present

## 2020-12-11 DIAGNOSIS — N2889 Other specified disorders of kidney and ureter: Secondary | ICD-10-CM | POA: Diagnosis not present

## 2020-12-11 DIAGNOSIS — F0390 Unspecified dementia without behavioral disturbance: Secondary | ICD-10-CM | POA: Diagnosis not present

## 2020-12-12 DIAGNOSIS — N2889 Other specified disorders of kidney and ureter: Secondary | ICD-10-CM | POA: Diagnosis not present

## 2020-12-12 DIAGNOSIS — D509 Iron deficiency anemia, unspecified: Secondary | ICD-10-CM | POA: Diagnosis not present

## 2020-12-12 DIAGNOSIS — N170 Acute kidney failure with tubular necrosis: Secondary | ICD-10-CM | POA: Diagnosis not present

## 2020-12-12 DIAGNOSIS — F0390 Unspecified dementia without behavioral disturbance: Secondary | ICD-10-CM | POA: Diagnosis not present

## 2020-12-12 DIAGNOSIS — N189 Chronic kidney disease, unspecified: Secondary | ICD-10-CM | POA: Diagnosis not present

## 2020-12-12 DIAGNOSIS — Z8673 Personal history of transient ischemic attack (TIA), and cerebral infarction without residual deficits: Secondary | ICD-10-CM | POA: Diagnosis not present

## 2020-12-12 DIAGNOSIS — M19079 Primary osteoarthritis, unspecified ankle and foot: Secondary | ICD-10-CM | POA: Diagnosis not present

## 2020-12-12 DIAGNOSIS — I129 Hypertensive chronic kidney disease with stage 1 through stage 4 chronic kidney disease, or unspecified chronic kidney disease: Secondary | ICD-10-CM | POA: Diagnosis not present

## 2020-12-12 DIAGNOSIS — F32A Depression, unspecified: Secondary | ICD-10-CM | POA: Diagnosis not present

## 2020-12-12 DIAGNOSIS — J449 Chronic obstructive pulmonary disease, unspecified: Secondary | ICD-10-CM | POA: Diagnosis not present

## 2020-12-12 DIAGNOSIS — I480 Paroxysmal atrial fibrillation: Secondary | ICD-10-CM | POA: Diagnosis not present

## 2020-12-12 DIAGNOSIS — Z9981 Dependence on supplemental oxygen: Secondary | ICD-10-CM | POA: Diagnosis not present

## 2020-12-12 DIAGNOSIS — E7800 Pure hypercholesterolemia, unspecified: Secondary | ICD-10-CM | POA: Diagnosis not present

## 2020-12-12 DIAGNOSIS — I080 Rheumatic disorders of both mitral and aortic valves: Secondary | ICD-10-CM | POA: Diagnosis not present

## 2020-12-12 DIAGNOSIS — F419 Anxiety disorder, unspecified: Secondary | ICD-10-CM | POA: Diagnosis not present

## 2020-12-12 DIAGNOSIS — E860 Dehydration: Secondary | ICD-10-CM | POA: Diagnosis not present

## 2020-12-16 ENCOUNTER — Other Ambulatory Visit: Payer: Self-pay | Admitting: Physician Assistant

## 2020-12-16 DIAGNOSIS — I480 Paroxysmal atrial fibrillation: Secondary | ICD-10-CM | POA: Diagnosis not present

## 2020-12-16 DIAGNOSIS — F32A Depression, unspecified: Secondary | ICD-10-CM | POA: Diagnosis not present

## 2020-12-16 DIAGNOSIS — F0390 Unspecified dementia without behavioral disturbance: Secondary | ICD-10-CM | POA: Diagnosis not present

## 2020-12-16 DIAGNOSIS — D509 Iron deficiency anemia, unspecified: Secondary | ICD-10-CM | POA: Diagnosis not present

## 2020-12-16 DIAGNOSIS — E860 Dehydration: Secondary | ICD-10-CM | POA: Diagnosis not present

## 2020-12-16 DIAGNOSIS — N189 Chronic kidney disease, unspecified: Secondary | ICD-10-CM | POA: Diagnosis not present

## 2020-12-16 DIAGNOSIS — I129 Hypertensive chronic kidney disease with stage 1 through stage 4 chronic kidney disease, or unspecified chronic kidney disease: Secondary | ICD-10-CM | POA: Diagnosis not present

## 2020-12-16 DIAGNOSIS — I080 Rheumatic disorders of both mitral and aortic valves: Secondary | ICD-10-CM | POA: Diagnosis not present

## 2020-12-16 DIAGNOSIS — M19079 Primary osteoarthritis, unspecified ankle and foot: Secondary | ICD-10-CM | POA: Diagnosis not present

## 2020-12-16 DIAGNOSIS — Z9981 Dependence on supplemental oxygen: Secondary | ICD-10-CM | POA: Diagnosis not present

## 2020-12-16 DIAGNOSIS — F419 Anxiety disorder, unspecified: Secondary | ICD-10-CM | POA: Diagnosis not present

## 2020-12-16 DIAGNOSIS — E7800 Pure hypercholesterolemia, unspecified: Secondary | ICD-10-CM | POA: Diagnosis not present

## 2020-12-16 DIAGNOSIS — N170 Acute kidney failure with tubular necrosis: Secondary | ICD-10-CM | POA: Diagnosis not present

## 2020-12-16 DIAGNOSIS — N2889 Other specified disorders of kidney and ureter: Secondary | ICD-10-CM | POA: Diagnosis not present

## 2020-12-16 DIAGNOSIS — J449 Chronic obstructive pulmonary disease, unspecified: Secondary | ICD-10-CM | POA: Diagnosis not present

## 2020-12-16 DIAGNOSIS — Z8673 Personal history of transient ischemic attack (TIA), and cerebral infarction without residual deficits: Secondary | ICD-10-CM | POA: Diagnosis not present

## 2020-12-18 DIAGNOSIS — N2889 Other specified disorders of kidney and ureter: Secondary | ICD-10-CM | POA: Diagnosis not present

## 2020-12-18 DIAGNOSIS — I480 Paroxysmal atrial fibrillation: Secondary | ICD-10-CM | POA: Diagnosis not present

## 2020-12-18 DIAGNOSIS — E7800 Pure hypercholesterolemia, unspecified: Secondary | ICD-10-CM | POA: Diagnosis not present

## 2020-12-18 DIAGNOSIS — J449 Chronic obstructive pulmonary disease, unspecified: Secondary | ICD-10-CM | POA: Diagnosis not present

## 2020-12-18 DIAGNOSIS — F0390 Unspecified dementia without behavioral disturbance: Secondary | ICD-10-CM | POA: Diagnosis not present

## 2020-12-18 DIAGNOSIS — E860 Dehydration: Secondary | ICD-10-CM | POA: Diagnosis not present

## 2020-12-18 DIAGNOSIS — N170 Acute kidney failure with tubular necrosis: Secondary | ICD-10-CM | POA: Diagnosis not present

## 2020-12-18 DIAGNOSIS — F32A Depression, unspecified: Secondary | ICD-10-CM | POA: Diagnosis not present

## 2020-12-18 DIAGNOSIS — D509 Iron deficiency anemia, unspecified: Secondary | ICD-10-CM | POA: Diagnosis not present

## 2020-12-18 DIAGNOSIS — F419 Anxiety disorder, unspecified: Secondary | ICD-10-CM | POA: Diagnosis not present

## 2020-12-18 DIAGNOSIS — Z9981 Dependence on supplemental oxygen: Secondary | ICD-10-CM | POA: Diagnosis not present

## 2020-12-18 DIAGNOSIS — I080 Rheumatic disorders of both mitral and aortic valves: Secondary | ICD-10-CM | POA: Diagnosis not present

## 2020-12-18 DIAGNOSIS — N189 Chronic kidney disease, unspecified: Secondary | ICD-10-CM | POA: Diagnosis not present

## 2020-12-18 DIAGNOSIS — I129 Hypertensive chronic kidney disease with stage 1 through stage 4 chronic kidney disease, or unspecified chronic kidney disease: Secondary | ICD-10-CM | POA: Diagnosis not present

## 2020-12-18 DIAGNOSIS — M19079 Primary osteoarthritis, unspecified ankle and foot: Secondary | ICD-10-CM | POA: Diagnosis not present

## 2020-12-18 DIAGNOSIS — Z8673 Personal history of transient ischemic attack (TIA), and cerebral infarction without residual deficits: Secondary | ICD-10-CM | POA: Diagnosis not present

## 2020-12-19 ENCOUNTER — Other Ambulatory Visit: Payer: Self-pay

## 2020-12-19 DIAGNOSIS — D509 Iron deficiency anemia, unspecified: Secondary | ICD-10-CM | POA: Diagnosis not present

## 2020-12-19 DIAGNOSIS — F419 Anxiety disorder, unspecified: Secondary | ICD-10-CM | POA: Diagnosis not present

## 2020-12-19 DIAGNOSIS — J449 Chronic obstructive pulmonary disease, unspecified: Secondary | ICD-10-CM | POA: Diagnosis not present

## 2020-12-19 DIAGNOSIS — Z79899 Other long term (current) drug therapy: Secondary | ICD-10-CM | POA: Diagnosis not present

## 2020-12-19 DIAGNOSIS — I129 Hypertensive chronic kidney disease with stage 1 through stage 4 chronic kidney disease, or unspecified chronic kidney disease: Secondary | ICD-10-CM | POA: Diagnosis not present

## 2020-12-19 DIAGNOSIS — M19079 Primary osteoarthritis, unspecified ankle and foot: Secondary | ICD-10-CM | POA: Diagnosis not present

## 2020-12-19 DIAGNOSIS — F0390 Unspecified dementia without behavioral disturbance: Secondary | ICD-10-CM | POA: Diagnosis not present

## 2020-12-19 DIAGNOSIS — N189 Chronic kidney disease, unspecified: Secondary | ICD-10-CM | POA: Diagnosis not present

## 2020-12-19 DIAGNOSIS — Z9981 Dependence on supplemental oxygen: Secondary | ICD-10-CM | POA: Diagnosis not present

## 2020-12-19 DIAGNOSIS — Z8673 Personal history of transient ischemic attack (TIA), and cerebral infarction without residual deficits: Secondary | ICD-10-CM | POA: Diagnosis not present

## 2020-12-19 DIAGNOSIS — N170 Acute kidney failure with tubular necrosis: Secondary | ICD-10-CM | POA: Diagnosis not present

## 2020-12-19 DIAGNOSIS — I080 Rheumatic disorders of both mitral and aortic valves: Secondary | ICD-10-CM | POA: Diagnosis not present

## 2020-12-19 DIAGNOSIS — E7800 Pure hypercholesterolemia, unspecified: Secondary | ICD-10-CM | POA: Diagnosis not present

## 2020-12-19 DIAGNOSIS — I480 Paroxysmal atrial fibrillation: Secondary | ICD-10-CM | POA: Diagnosis not present

## 2020-12-19 DIAGNOSIS — N2889 Other specified disorders of kidney and ureter: Secondary | ICD-10-CM | POA: Diagnosis not present

## 2020-12-19 DIAGNOSIS — E860 Dehydration: Secondary | ICD-10-CM | POA: Diagnosis not present

## 2020-12-19 DIAGNOSIS — F32A Depression, unspecified: Secondary | ICD-10-CM | POA: Diagnosis not present

## 2020-12-20 LAB — BASIC METABOLIC PANEL
BUN/Creatinine Ratio: 10 — ABNORMAL LOW (ref 12–28)
BUN: 18 mg/dL (ref 8–27)
CO2: 29 mmol/L (ref 20–29)
Calcium: 8.9 mg/dL (ref 8.7–10.3)
Chloride: 102 mmol/L (ref 96–106)
Creatinine, Ser: 1.74 mg/dL — ABNORMAL HIGH (ref 0.57–1.00)
Glucose: 115 mg/dL — ABNORMAL HIGH (ref 65–99)
Potassium: 3.5 mmol/L (ref 3.5–5.2)
Sodium: 144 mmol/L (ref 134–144)
eGFR: 28 mL/min/{1.73_m2} — ABNORMAL LOW (ref >59)

## 2020-12-22 DIAGNOSIS — J449 Chronic obstructive pulmonary disease, unspecified: Secondary | ICD-10-CM | POA: Diagnosis not present

## 2020-12-22 DIAGNOSIS — I480 Paroxysmal atrial fibrillation: Secondary | ICD-10-CM | POA: Diagnosis not present

## 2020-12-22 DIAGNOSIS — E860 Dehydration: Secondary | ICD-10-CM | POA: Diagnosis not present

## 2020-12-22 DIAGNOSIS — N170 Acute kidney failure with tubular necrosis: Secondary | ICD-10-CM | POA: Diagnosis not present

## 2020-12-22 DIAGNOSIS — F32A Depression, unspecified: Secondary | ICD-10-CM | POA: Diagnosis not present

## 2020-12-22 DIAGNOSIS — M19079 Primary osteoarthritis, unspecified ankle and foot: Secondary | ICD-10-CM | POA: Diagnosis not present

## 2020-12-22 DIAGNOSIS — Z8673 Personal history of transient ischemic attack (TIA), and cerebral infarction without residual deficits: Secondary | ICD-10-CM | POA: Diagnosis not present

## 2020-12-22 DIAGNOSIS — I129 Hypertensive chronic kidney disease with stage 1 through stage 4 chronic kidney disease, or unspecified chronic kidney disease: Secondary | ICD-10-CM | POA: Diagnosis not present

## 2020-12-22 DIAGNOSIS — E7800 Pure hypercholesterolemia, unspecified: Secondary | ICD-10-CM | POA: Diagnosis not present

## 2020-12-22 DIAGNOSIS — F419 Anxiety disorder, unspecified: Secondary | ICD-10-CM | POA: Diagnosis not present

## 2020-12-22 DIAGNOSIS — Z9981 Dependence on supplemental oxygen: Secondary | ICD-10-CM | POA: Diagnosis not present

## 2020-12-22 DIAGNOSIS — D509 Iron deficiency anemia, unspecified: Secondary | ICD-10-CM | POA: Diagnosis not present

## 2020-12-22 DIAGNOSIS — F0390 Unspecified dementia without behavioral disturbance: Secondary | ICD-10-CM | POA: Diagnosis not present

## 2020-12-22 DIAGNOSIS — N2889 Other specified disorders of kidney and ureter: Secondary | ICD-10-CM | POA: Diagnosis not present

## 2020-12-22 DIAGNOSIS — I080 Rheumatic disorders of both mitral and aortic valves: Secondary | ICD-10-CM | POA: Diagnosis not present

## 2020-12-22 DIAGNOSIS — N189 Chronic kidney disease, unspecified: Secondary | ICD-10-CM | POA: Diagnosis not present

## 2020-12-24 ENCOUNTER — Other Ambulatory Visit: Payer: Self-pay | Admitting: Family Medicine

## 2020-12-25 ENCOUNTER — Telehealth: Payer: Self-pay | Admitting: Cardiology

## 2020-12-25 DIAGNOSIS — Z8673 Personal history of transient ischemic attack (TIA), and cerebral infarction without residual deficits: Secondary | ICD-10-CM | POA: Diagnosis not present

## 2020-12-25 DIAGNOSIS — F419 Anxiety disorder, unspecified: Secondary | ICD-10-CM | POA: Diagnosis not present

## 2020-12-25 DIAGNOSIS — E7800 Pure hypercholesterolemia, unspecified: Secondary | ICD-10-CM | POA: Diagnosis not present

## 2020-12-25 DIAGNOSIS — F0390 Unspecified dementia without behavioral disturbance: Secondary | ICD-10-CM | POA: Diagnosis not present

## 2020-12-25 DIAGNOSIS — J449 Chronic obstructive pulmonary disease, unspecified: Secondary | ICD-10-CM | POA: Diagnosis not present

## 2020-12-25 DIAGNOSIS — N170 Acute kidney failure with tubular necrosis: Secondary | ICD-10-CM | POA: Diagnosis not present

## 2020-12-25 DIAGNOSIS — I129 Hypertensive chronic kidney disease with stage 1 through stage 4 chronic kidney disease, or unspecified chronic kidney disease: Secondary | ICD-10-CM | POA: Diagnosis not present

## 2020-12-25 DIAGNOSIS — M19079 Primary osteoarthritis, unspecified ankle and foot: Secondary | ICD-10-CM | POA: Diagnosis not present

## 2020-12-25 DIAGNOSIS — N2889 Other specified disorders of kidney and ureter: Secondary | ICD-10-CM | POA: Diagnosis not present

## 2020-12-25 DIAGNOSIS — I080 Rheumatic disorders of both mitral and aortic valves: Secondary | ICD-10-CM | POA: Diagnosis not present

## 2020-12-25 DIAGNOSIS — F32A Depression, unspecified: Secondary | ICD-10-CM | POA: Diagnosis not present

## 2020-12-25 DIAGNOSIS — E860 Dehydration: Secondary | ICD-10-CM | POA: Diagnosis not present

## 2020-12-25 DIAGNOSIS — I480 Paroxysmal atrial fibrillation: Secondary | ICD-10-CM | POA: Diagnosis not present

## 2020-12-25 DIAGNOSIS — N189 Chronic kidney disease, unspecified: Secondary | ICD-10-CM | POA: Diagnosis not present

## 2020-12-25 DIAGNOSIS — Z9981 Dependence on supplemental oxygen: Secondary | ICD-10-CM | POA: Diagnosis not present

## 2020-12-25 DIAGNOSIS — D509 Iron deficiency anemia, unspecified: Secondary | ICD-10-CM | POA: Diagnosis not present

## 2020-12-25 NOTE — Telephone Encounter (Signed)
Son Birdie Hopes is calling in regards to getting pt's blood work results. Please advise pt's son further

## 2020-12-26 NOTE — Telephone Encounter (Signed)
Spoke with patient's son, see chart.

## 2020-12-29 DIAGNOSIS — I129 Hypertensive chronic kidney disease with stage 1 through stage 4 chronic kidney disease, or unspecified chronic kidney disease: Secondary | ICD-10-CM | POA: Diagnosis not present

## 2020-12-29 DIAGNOSIS — E7800 Pure hypercholesterolemia, unspecified: Secondary | ICD-10-CM | POA: Diagnosis not present

## 2020-12-29 DIAGNOSIS — J449 Chronic obstructive pulmonary disease, unspecified: Secondary | ICD-10-CM | POA: Diagnosis not present

## 2020-12-29 DIAGNOSIS — Z8673 Personal history of transient ischemic attack (TIA), and cerebral infarction without residual deficits: Secondary | ICD-10-CM | POA: Diagnosis not present

## 2020-12-29 DIAGNOSIS — N2889 Other specified disorders of kidney and ureter: Secondary | ICD-10-CM | POA: Diagnosis not present

## 2020-12-29 DIAGNOSIS — F0390 Unspecified dementia without behavioral disturbance: Secondary | ICD-10-CM | POA: Diagnosis not present

## 2020-12-29 DIAGNOSIS — D509 Iron deficiency anemia, unspecified: Secondary | ICD-10-CM | POA: Diagnosis not present

## 2020-12-29 DIAGNOSIS — N170 Acute kidney failure with tubular necrosis: Secondary | ICD-10-CM | POA: Diagnosis not present

## 2020-12-29 DIAGNOSIS — F419 Anxiety disorder, unspecified: Secondary | ICD-10-CM | POA: Diagnosis not present

## 2020-12-29 DIAGNOSIS — I080 Rheumatic disorders of both mitral and aortic valves: Secondary | ICD-10-CM | POA: Diagnosis not present

## 2020-12-29 DIAGNOSIS — F32A Depression, unspecified: Secondary | ICD-10-CM | POA: Diagnosis not present

## 2020-12-29 DIAGNOSIS — I480 Paroxysmal atrial fibrillation: Secondary | ICD-10-CM | POA: Diagnosis not present

## 2020-12-29 DIAGNOSIS — N189 Chronic kidney disease, unspecified: Secondary | ICD-10-CM | POA: Diagnosis not present

## 2020-12-29 DIAGNOSIS — Z9981 Dependence on supplemental oxygen: Secondary | ICD-10-CM | POA: Diagnosis not present

## 2020-12-29 DIAGNOSIS — E860 Dehydration: Secondary | ICD-10-CM | POA: Diagnosis not present

## 2020-12-29 DIAGNOSIS — M19079 Primary osteoarthritis, unspecified ankle and foot: Secondary | ICD-10-CM | POA: Diagnosis not present

## 2020-12-31 ENCOUNTER — Telehealth: Payer: Self-pay | Admitting: Oncology

## 2020-12-31 DIAGNOSIS — E7800 Pure hypercholesterolemia, unspecified: Secondary | ICD-10-CM | POA: Diagnosis not present

## 2020-12-31 DIAGNOSIS — Z8673 Personal history of transient ischemic attack (TIA), and cerebral infarction without residual deficits: Secondary | ICD-10-CM | POA: Diagnosis not present

## 2020-12-31 DIAGNOSIS — I080 Rheumatic disorders of both mitral and aortic valves: Secondary | ICD-10-CM | POA: Diagnosis not present

## 2020-12-31 DIAGNOSIS — N189 Chronic kidney disease, unspecified: Secondary | ICD-10-CM | POA: Diagnosis not present

## 2020-12-31 DIAGNOSIS — D509 Iron deficiency anemia, unspecified: Secondary | ICD-10-CM | POA: Diagnosis not present

## 2020-12-31 DIAGNOSIS — F32A Depression, unspecified: Secondary | ICD-10-CM | POA: Diagnosis not present

## 2020-12-31 DIAGNOSIS — N170 Acute kidney failure with tubular necrosis: Secondary | ICD-10-CM | POA: Diagnosis not present

## 2020-12-31 DIAGNOSIS — E860 Dehydration: Secondary | ICD-10-CM | POA: Diagnosis not present

## 2020-12-31 DIAGNOSIS — F0390 Unspecified dementia without behavioral disturbance: Secondary | ICD-10-CM | POA: Diagnosis not present

## 2020-12-31 DIAGNOSIS — N2889 Other specified disorders of kidney and ureter: Secondary | ICD-10-CM | POA: Diagnosis not present

## 2020-12-31 DIAGNOSIS — J449 Chronic obstructive pulmonary disease, unspecified: Secondary | ICD-10-CM | POA: Diagnosis not present

## 2020-12-31 DIAGNOSIS — I480 Paroxysmal atrial fibrillation: Secondary | ICD-10-CM | POA: Diagnosis not present

## 2020-12-31 DIAGNOSIS — Z9981 Dependence on supplemental oxygen: Secondary | ICD-10-CM | POA: Diagnosis not present

## 2020-12-31 DIAGNOSIS — M19079 Primary osteoarthritis, unspecified ankle and foot: Secondary | ICD-10-CM | POA: Diagnosis not present

## 2020-12-31 DIAGNOSIS — F419 Anxiety disorder, unspecified: Secondary | ICD-10-CM | POA: Diagnosis not present

## 2020-12-31 DIAGNOSIS — I129 Hypertensive chronic kidney disease with stage 1 through stage 4 chronic kidney disease, or unspecified chronic kidney disease: Secondary | ICD-10-CM | POA: Diagnosis not present

## 2020-12-31 NOTE — Telephone Encounter (Signed)
Patient referred by Dr Joie Bimler for New Dx: Left Renal CA.  Appt made 01/14/21 Labs 10:00 am - Consult 10:30 am

## 2021-01-01 ENCOUNTER — Other Ambulatory Visit: Payer: Self-pay | Admitting: Family Medicine

## 2021-01-01 DIAGNOSIS — I129 Hypertensive chronic kidney disease with stage 1 through stage 4 chronic kidney disease, or unspecified chronic kidney disease: Secondary | ICD-10-CM

## 2021-01-05 ENCOUNTER — Other Ambulatory Visit: Payer: Self-pay | Admitting: Family Medicine

## 2021-01-06 ENCOUNTER — Telehealth: Payer: Self-pay | Admitting: Cardiology

## 2021-01-06 ENCOUNTER — Other Ambulatory Visit: Payer: Self-pay

## 2021-01-06 DIAGNOSIS — I5033 Acute on chronic diastolic (congestive) heart failure: Secondary | ICD-10-CM

## 2021-01-06 DIAGNOSIS — Z79899 Other long term (current) drug therapy: Secondary | ICD-10-CM

## 2021-01-06 NOTE — Telephone Encounter (Signed)
Called patient's son. He is aware pt's need labs redrawn and a decision will be made about her medication after the labs are obtained. Verbalized understanding. No questions or concerns expressed at this time.

## 2021-01-06 NOTE — Progress Notes (Signed)
Orders placed.

## 2021-01-06 NOTE — Telephone Encounter (Signed)
   Pt c/o medication issue:  1. Name of Medication:   furosemide (LASIX) 20 MG tablet    2. How are you currently taking this medication (dosage and times per day)? Take 1 tablet (20 mg total) by mouth daily.  3. Are you having a reaction (difficulty breathing--STAT)?   4. What is your medication issue? Pt's son requesting to speak with a nurse to ask if pt needs to change dosage of this meds

## 2021-01-07 ENCOUNTER — Other Ambulatory Visit: Payer: Self-pay

## 2021-01-07 DIAGNOSIS — Z8673 Personal history of transient ischemic attack (TIA), and cerebral infarction without residual deficits: Secondary | ICD-10-CM | POA: Diagnosis not present

## 2021-01-07 DIAGNOSIS — E860 Dehydration: Secondary | ICD-10-CM | POA: Diagnosis not present

## 2021-01-07 DIAGNOSIS — I5033 Acute on chronic diastolic (congestive) heart failure: Secondary | ICD-10-CM

## 2021-01-07 DIAGNOSIS — F0390 Unspecified dementia without behavioral disturbance: Secondary | ICD-10-CM | POA: Diagnosis not present

## 2021-01-07 DIAGNOSIS — F419 Anxiety disorder, unspecified: Secondary | ICD-10-CM | POA: Diagnosis not present

## 2021-01-07 DIAGNOSIS — E7800 Pure hypercholesterolemia, unspecified: Secondary | ICD-10-CM | POA: Diagnosis not present

## 2021-01-07 DIAGNOSIS — N2889 Other specified disorders of kidney and ureter: Secondary | ICD-10-CM | POA: Diagnosis not present

## 2021-01-07 DIAGNOSIS — F32A Depression, unspecified: Secondary | ICD-10-CM | POA: Diagnosis not present

## 2021-01-07 DIAGNOSIS — Z79899 Other long term (current) drug therapy: Secondary | ICD-10-CM

## 2021-01-07 DIAGNOSIS — I129 Hypertensive chronic kidney disease with stage 1 through stage 4 chronic kidney disease, or unspecified chronic kidney disease: Secondary | ICD-10-CM | POA: Diagnosis not present

## 2021-01-07 DIAGNOSIS — N189 Chronic kidney disease, unspecified: Secondary | ICD-10-CM | POA: Diagnosis not present

## 2021-01-07 DIAGNOSIS — I080 Rheumatic disorders of both mitral and aortic valves: Secondary | ICD-10-CM | POA: Diagnosis not present

## 2021-01-07 DIAGNOSIS — D509 Iron deficiency anemia, unspecified: Secondary | ICD-10-CM | POA: Diagnosis not present

## 2021-01-07 DIAGNOSIS — N170 Acute kidney failure with tubular necrosis: Secondary | ICD-10-CM | POA: Diagnosis not present

## 2021-01-07 DIAGNOSIS — J449 Chronic obstructive pulmonary disease, unspecified: Secondary | ICD-10-CM | POA: Diagnosis not present

## 2021-01-07 DIAGNOSIS — I480 Paroxysmal atrial fibrillation: Secondary | ICD-10-CM | POA: Diagnosis not present

## 2021-01-07 DIAGNOSIS — Z9981 Dependence on supplemental oxygen: Secondary | ICD-10-CM | POA: Diagnosis not present

## 2021-01-07 DIAGNOSIS — M19079 Primary osteoarthritis, unspecified ankle and foot: Secondary | ICD-10-CM | POA: Diagnosis not present

## 2021-01-07 NOTE — Addendum Note (Signed)
Addended by: Truddie Hidden on: 01/07/2021 02:24 PM   Modules accepted: Orders

## 2021-01-08 LAB — CBC WITH DIFFERENTIAL/PLATELET
Basophils Absolute: 0 10*3/uL (ref 0.0–0.2)
Basos: 0 %
EOS (ABSOLUTE): 0 10*3/uL (ref 0.0–0.4)
Eos: 0 %
Hematocrit: 33.3 % — ABNORMAL LOW (ref 34.0–46.6)
Hemoglobin: 10.3 g/dL — ABNORMAL LOW (ref 11.1–15.9)
Immature Grans (Abs): 0 10*3/uL (ref 0.0–0.1)
Immature Granulocytes: 0 %
Lymphocytes Absolute: 0.7 10*3/uL (ref 0.7–3.1)
Lymphs: 22 %
MCH: 28 pg (ref 26.6–33.0)
MCHC: 30.9 g/dL — ABNORMAL LOW (ref 31.5–35.7)
MCV: 91 fL (ref 79–97)
Monocytes Absolute: 0.5 10*3/uL (ref 0.1–0.9)
Monocytes: 16 %
Neutrophils Absolute: 1.8 10*3/uL (ref 1.4–7.0)
Neutrophils: 62 %
Platelets: 191 10*3/uL (ref 150–450)
RBC: 3.68 x10E6/uL — ABNORMAL LOW (ref 3.77–5.28)
RDW: 16.5 % — ABNORMAL HIGH (ref 11.7–15.4)
WBC: 2.9 10*3/uL — ABNORMAL LOW (ref 3.4–10.8)

## 2021-01-08 LAB — COMPREHENSIVE METABOLIC PANEL
ALT: 10 IU/L (ref 0–32)
AST: 12 IU/L (ref 0–40)
Albumin/Globulin Ratio: 1.3 (ref 1.2–2.2)
Albumin: 3.6 g/dL (ref 3.6–4.6)
Alkaline Phosphatase: 66 IU/L (ref 44–121)
BUN/Creatinine Ratio: 11 — ABNORMAL LOW (ref 12–28)
BUN: 20 mg/dL (ref 8–27)
Bilirubin Total: 0.7 mg/dL (ref 0.0–1.2)
CO2: 26 mmol/L (ref 20–29)
Calcium: 8.9 mg/dL (ref 8.7–10.3)
Chloride: 99 mmol/L (ref 96–106)
Creatinine, Ser: 1.89 mg/dL — ABNORMAL HIGH (ref 0.57–1.00)
Globulin, Total: 2.8 g/dL (ref 1.5–4.5)
Glucose: 100 mg/dL — ABNORMAL HIGH (ref 65–99)
Potassium: 3.7 mmol/L (ref 3.5–5.2)
Sodium: 143 mmol/L (ref 134–144)
Total Protein: 6.4 g/dL (ref 6.0–8.5)
eGFR: 25 mL/min/{1.73_m2} — ABNORMAL LOW (ref >59)

## 2021-01-08 LAB — PRO B NATRIURETIC PEPTIDE: NT-Pro BNP: 11561 pg/mL — ABNORMAL HIGH (ref 0–738)

## 2021-01-09 ENCOUNTER — Telehealth: Payer: Self-pay

## 2021-01-09 NOTE — Telephone Encounter (Signed)
-----   Message from Park Liter, MD sent at 01/08/2021 10:16 AM EDT ----- Her laboratory tests are unchanged.  Her kidney function is still diminished minimally worse than before.  Her proBNP is still elevated.  I would recommend not to change any of her medications now

## 2021-01-09 NOTE — Telephone Encounter (Signed)
Follow Up:       Patient is  returning Sherry Hess's call from today, concerning her results.

## 2021-01-09 NOTE — Telephone Encounter (Signed)
Spoke with Randall(ok per DPR) notified of results and recommendations

## 2021-01-13 DIAGNOSIS — F0390 Unspecified dementia without behavioral disturbance: Secondary | ICD-10-CM | POA: Diagnosis not present

## 2021-01-13 DIAGNOSIS — N2889 Other specified disorders of kidney and ureter: Secondary | ICD-10-CM | POA: Diagnosis not present

## 2021-01-13 DIAGNOSIS — I080 Rheumatic disorders of both mitral and aortic valves: Secondary | ICD-10-CM | POA: Diagnosis not present

## 2021-01-13 DIAGNOSIS — Z9981 Dependence on supplemental oxygen: Secondary | ICD-10-CM | POA: Diagnosis not present

## 2021-01-13 DIAGNOSIS — F32A Depression, unspecified: Secondary | ICD-10-CM | POA: Diagnosis not present

## 2021-01-13 DIAGNOSIS — E860 Dehydration: Secondary | ICD-10-CM | POA: Diagnosis not present

## 2021-01-13 DIAGNOSIS — I129 Hypertensive chronic kidney disease with stage 1 through stage 4 chronic kidney disease, or unspecified chronic kidney disease: Secondary | ICD-10-CM | POA: Diagnosis not present

## 2021-01-13 DIAGNOSIS — D509 Iron deficiency anemia, unspecified: Secondary | ICD-10-CM | POA: Diagnosis not present

## 2021-01-13 DIAGNOSIS — E7800 Pure hypercholesterolemia, unspecified: Secondary | ICD-10-CM | POA: Diagnosis not present

## 2021-01-13 DIAGNOSIS — Z8673 Personal history of transient ischemic attack (TIA), and cerebral infarction without residual deficits: Secondary | ICD-10-CM | POA: Diagnosis not present

## 2021-01-13 DIAGNOSIS — N189 Chronic kidney disease, unspecified: Secondary | ICD-10-CM | POA: Diagnosis not present

## 2021-01-13 DIAGNOSIS — J449 Chronic obstructive pulmonary disease, unspecified: Secondary | ICD-10-CM | POA: Diagnosis not present

## 2021-01-13 DIAGNOSIS — F419 Anxiety disorder, unspecified: Secondary | ICD-10-CM | POA: Diagnosis not present

## 2021-01-13 DIAGNOSIS — I480 Paroxysmal atrial fibrillation: Secondary | ICD-10-CM | POA: Diagnosis not present

## 2021-01-13 DIAGNOSIS — N170 Acute kidney failure with tubular necrosis: Secondary | ICD-10-CM | POA: Diagnosis not present

## 2021-01-13 DIAGNOSIS — M19079 Primary osteoarthritis, unspecified ankle and foot: Secondary | ICD-10-CM | POA: Diagnosis not present

## 2021-01-13 NOTE — Progress Notes (Signed)
[facility]  [address] [place],  [place]  [address] [phone]  Clinic Day:  01/14/2021  Referring physician: [name], MD  This document serves as a record of services personally performed by Hosie Poisson, MD. It was created on their behalf by [facility], a trained medical scribe. The creation of this record is based on the scribe's personal observations and the provider's statements to them.  CHIEF COMPLAINT:  CC: Left renal mass  Current Treatment:  Observation   HISTORY OF PRESENT ILLNESS:  Sherry Hess is a 87 y.o. female referred by Dr. Joie Bimler for the evaluation and treatment of newly diagnosed left renal carcinoma.  This began when the patient presented to the emergency department in June due to weakness, acute renal failure and dehydration.  Initial BUN was 76 with a creatinine of 3.1.  CT chest, abdomen and pelvis revealed a large centrally necrotic mass lesion which appeared to arise from the upper pole of the left kidney, measuring approximately 16.9 x 12.5 cm in greatest AP and transverse dimensions and extends for approximately 24 cm in craniocaudad projection, consistent with renal cell carcinoma till proven otherwise.  She was administered IV fluids with improvement in her BUN to 25 and creatinine to 1.3 at the time of discharge.  She was also administered IV iron and she continues oral supplement.  She was referred to Dr. Nila Nephew for outpatient evaluation, and she was deemed not to be a surgical candidate due to her multiple comorbidities.  She has a medical history significant for degenerative disc disease, anemia, dementia, TIA and CVA, hypertension, congestive heart failure and syncope.  INTERVAL HISTORY:  Sherry Hess is on supplemental oxygen 1 L 24 hours per day.  She denies pain or abdominal swelling.  She continues her daily tasks without difficulty.  She does have a history of TIA and CVA, 2-3 years ago, but has  completely recovered.  She also has a history of dementia, but is able to answer questions accordingly, but has decreased memory and relies on her son for a historian.  White count is 2.4 with an ANC of 1370, hemoglobin is 10.5, and platelet count is normal.  Chemistries are remarkable for a BUN of 24, a creatinine of 2.0, and a potassium of 3.0.  Her  appetite is good, and she is eating well.  She denies any significant weight loss or gain.  She did lose 8 pounds which they feel is due to lower extremity swelling and Lasix.  She denies fever, chills or other signs of infection.  She denies nausea, vomiting, bowel issues, or abdominal pain.  She denies sore throat, cough, dyspnea, or chest pain.  [name] accompanied by her son today.  REVIEW OF SYSTEMS:  Review of Systems  Constitutional: Negative.  Negative for appetite change, chills, fatigue, fever and unexpected weight change.  HENT:  Negative.    Eyes: Negative.   Respiratory:  Positive for shortness of breath (on supplemental oxygen). Negative for chest tightness, cough, hemoptysis and wheezing.   Cardiovascular: Negative.  Negative for chest pain, leg swelling and palpitations.  Gastrointestinal: Negative.  Negative for abdominal distention, abdominal pain, blood in stool, constipation, diarrhea, nausea and vomiting.  Endocrine: Negative.   Genitourinary: Negative.  Negative for difficulty urinating, dysuria, frequency and hematuria.   Musculoskeletal: Negative.  Negative for arthralgias, back pain, flank pain, gait problem and myalgias.  Skin: Negative.   Neurological: Negative.  Negative for dizziness, extremity weakness, gait problem,  headaches, light-headedness, numbness, seizures and speech difficulty.  Hematological: Negative.   Psychiatric/Behavioral: Negative.  Negative for depression and sleep disturbance. The patient is not nervous/anxious.     VITALS:  Blood pressure (!) 121/59, pulse 76, temperature 98.4 F (36.9 C), temperature  source Oral, resp. rate (!) 28, height 5\' 3"  (1.6 m), weight 132 lb 8 oz (60.1 kg), SpO2 94 %.  Wt Readings from Last 3 Encounters:  01/14/21 132 lb 8 oz (60.1 kg)  12/08/20 141 lb (64 kg)  11/27/20 148 lb (67.1 kg)    Body mass index is 23.47 kg/m.  Performance status (ECOG): 1 - Symptomatic but completely ambulatory  PHYSICAL EXAM:  Physical Exam Constitutional:      General: [name] not in acute distress.    Appearance: Normal appearance. [name] normal weight.  HENT:     Head: Normocephalic and atraumatic.  Eyes:     General: No scleral icterus.    Extraocular Movements: Extraocular movements intact.     Conjunctiva/sclera: Conjunctivae normal.     Pupils: Pupils are equal, round, and reactive to light.  Cardiovascular:     Rate and Rhythm: Normal rate and regular rhythm.     Pulses: Normal pulses.     Heart sounds: Normal heart sounds. No murmur heard.   No friction rub. No gallop.  Pulmonary:     Effort: Pulmonary effort is normal. No respiratory distress.     Breath sounds: Decreased breath sounds (at the left base with fine crackles) present.  Abdominal:     General: Bowel sounds are normal. There is no distension.     Palpations: Abdomen is soft. There is mass (of the left upper quadrant, 15 cm below the left costal margin and extends across the midline, about 3/4 across the upper abdomen). There is no hepatomegaly or splenomegaly.     Tenderness: There is no abdominal tenderness.  Musculoskeletal:        General: Normal range of motion.     Cervical back: Normal range of motion and neck supple.     Right lower leg: Edema (trace, with chronic circulatory changes) present.     Left lower leg: Edema (trace, with chronic circulatory changes) present.  Lymphadenopathy:     Cervical: No cervical adenopathy.  Skin:    General: Skin is warm and dry.  Neurological:     General: No focal deficit present.     Mental Status: [name] alert and oriented to person, place, and  time. Mental status is at baseline.  Psychiatric:        Mood and Affect: Mood normal.        Behavior: Behavior normal.        Thought Content: Thought content normal.        Judgment: Judgment normal.   pallor LABS:   CBC Latest Ref Rng & Units 01/14/2021 01/07/2021 12/08/2020  WBC - 2.4 2.9(L) 3.9  Hemoglobin 12.0 - 16.0 10.5(A) 10.3(L) 9.5(L)  Hematocrit 36 - 46 32(A) 33.3(L) 29.0(L)  Platelets 150 - 399 141(A) 191 236   CMP Latest Ref Rng & Units 01/14/2021 01/07/2021 12/19/2020  Glucose 65 - 99 mg/dL - 100(H) 115(H)  BUN 4 - 21 24(A) 20 18  Creatinine 0.5 - 1.1 2.0(A) 1.89(H) 1.74(H)  Sodium 137 - 147 141 143 144  Potassium 3.4 - 5.3 3.0(A) 3.7 3.5  Chloride 99 - 108 100 99 102  CO2 13 - 22 33(A) 26 29  Calcium 8.7 - 10.7 8.6(A) 8.9  8.9  Total Protein 6.0 - 8.5 g/dL - 6.4 -  Total Bilirubin 0.0 - 1.2 mg/dL - 0.7 -  Alkaline Phos 25 - 125 60 66 -  AST 13 - 35 20 12 -  ALT 7 - 35 12 10 -     Lab Results  Component Value Date   CEA1 2.4 03/10/2017   /  CEA  Date Value Ref Range Status  03/10/2017 2.4 0.0 - 4.7 ng/mL Final    Comment:    (NOTE)       Roche ECLIA methodology       Nonsmokers  <3.9                                     Smokers     <5.6 Performed At: [facility] [place], [place] [id number] Lindon Romp MD CZ:[id number]    Lab Results  Component Value Date   TIBC 301 03/10/2017   FERRITIN 132 03/10/2017   IRONPCTSAT 7 (L) 03/10/2017   No results found for: LDH  STUDIES:  No results found.   EXAM: 11/10/2020 CT CHEST, ABDOMEN AND PELVIS WITHOUT CONTRAST  TECHNIQUE: Multidetector CT imaging of the chest, abdomen and pelvis was performed following the standard protocol without IV contrast.  COMPARISON:  Chest x-ray from earlier in the same day, chest x-ray from 06/01/2017.  FINDINGS: CT CHEST FINDINGS  Cardiovascular: Atherosclerotic calcifications of the thoracic aorta are noted. No aneurysmal dilatation is  seen. Heart is at the upper limits of normal in size. Mild coronary calcifications are noted.  Mediastinum/Nodes: Thoracic inlet is within normal limits. No sizable hilar or mediastinal adenopathy is noted. The esophagus is within normal limits as visualized.  Lungs/Pleura: Lungs are well aerated bilaterally with the exception of small left-sided pleural effusion and mild left lower lobe atelectatic changes. This corresponds with that seen on recent chest x-ray.  Musculoskeletal: Degenerative changes of the thoracic spine are noted. No definitive lytic or sclerotic lesion is noted compression deformity is noted at T2 which appears chronic in nature but incompletely evaluated on this exam. T12 compression fracture is noted stable from prior chest x-ray from 2019  CT ABDOMEN PELVIS FINDINGS  Hepatobiliary: Gallbladder is decompressed. Liver appears within normal limits.  Pancreas: Pancreas appears within normal limits although displaced by large left-sided abdominal mass.  Spleen: Spleen is within normal limits although also displaced anteriorly by large intra-abdominal mass.  Adrenals/Urinary Tract: Right adrenal gland is within normal limits. Right kidney is unremarkable. The left kidney is significantly displaced laterally by the large adjacent mass lesion which appears to be exophytic from upper pole of the left kidney. The mass lesion measures approximately 16.9 x 12.5 cm in greatest AP and transverse dimensions and extends for approximately 24 cm in craniocaudad projection. It demonstrates central decreased attenuation consistent with underlying necrosis. Scattered calcifications are noted within. These changes are felt to represent a renal cell carcinoma till proven otherwise. Bladder is within normal limits.  Stomach/Bowel: Diverticular change of the colon is noted. No obstructive or inflammatory changes of the colon are seen. The appendix is not well seen although no  inflammatory changes to suggest appendicitis are noted. Small bowel and stomach are unremarkable although also displaced by the large left renal mass.  Vascular/Lymphatic: Aortic atherosclerosis. No enlarged abdominal or pelvic lymph nodes.  Reproductive: Status post hysterectomy. No adnexal masses.  Other: No abdominal wall hernia  or abnormality. No abdominopelvic ascites.  Musculoskeletal: Degenerative changes of lumbar spine are seen. No other focal abnormality is noted.  IMPRESSION: Large centrally necrotic mass lesion which appears to arise from the upper pole of the left kidney as described. These changes are consistent with renal cell carcinoma till proven otherwise.  Small left pleural effusion with left basilar atelectasis.  Chronic compression deformity at T12. A compression deformity is also noted at T2 which appears chronic in nature as well although no prior examination demonstrated this deformity.  Aortic Atherosclerosis (ICD10-I70.0).   HISTORY:   Past Medical History:  Diagnosis Date   Anxiety    Benign neoplasm of rectum    Benign neoplasm of sigmoid colon    Chest pain in adult 12/27/2015   CKD (chronic kidney disease) stage 4, GFR 15-29 ml/min ([place]) 07/02/2019   CVA (cerebral vascular accident) ([place]) 03/08/2017   Dyspepsia 03/09/2017   Dyspepsia 03/09/2017   Essential hypertension 11/20/2014   Fatigue 12/10/2016   High cholesterol    HLD (hyperlipidemia) 03/09/2017   Hx of transient ischemic attack (TIA) 10/18/2016   Hypertension    Hypertension, renal disease, stage 1-4 or unspecified chronic kidney disease 07/02/2019   Mixed hyperlipidemia 03/09/2017   Normocytic anemia    Occult blood positive stool 03/09/2017   Paroxysmal atrial fibrillation ([place]) 02/23/2017   Syncope 12/10/2016   TIA (transient ischemic attack)    Vascular dementia without behavioral disturbance ([place]) 07/02/2019    Past Surgical History:  Procedure Laterality Date   ABDOMINAL  HYSTERECTOMY     COLONOSCOPY N/A 03/11/2017   Procedure: COLONOSCOPY;  Surgeon: Gatha Mayer, MD;  Location: [facility];  Service: Endoscopy;  Laterality: N/A;   ESOPHAGOGASTRODUODENOSCOPY N/A 03/11/2017   Procedure: ESOPHAGOGASTRODUODENOSCOPY (EGD);  Surgeon: Gatha Mayer, MD;  Location: [facility] ENDOSCOPY;  Service: Endoscopy;  Laterality: N/A;   TONSILLECTOMY     VESICOVAGINAL FISTULA CLOSURE W/ TAH      Family History  Problem Relation Age of Onset   Cancer Father        lung   Stroke Mother    Cancer Brother        x2 bladder   Dementia Brother    Atrial fibrillation Son    Ataxia Neg Hx    Chorea Neg Hx    Mental retardation Neg Hx    Migraines Neg Hx    Multiple sclerosis Neg Hx    Neurofibromatosis Neg Hx    Neuropathy Neg Hx    Parkinsonism Neg Hx    Seizures Neg Hx     Social History:  reports that she has never smoked. She has never used smokeless tobacco. She reports that she does not drink alcohol and does not use drugs.The patient is accompanied by her son today.  [name] widowed and lives at home with her son.  She has 3 children.  [name] retired, and has never been exposed to chemicals or other toxic agents.  Allergies:  Allergies  Allergen Reactions   Minoxidil     rash   Norvasc [Amlodipine Besylate]     rash   Clonidine Derivatives Rash    [name]    Current Medications: Current Outpatient Medications  Medication Sig Dispense Refill   atorvastatin (LIPITOR) 20 MG tablet TAKE 1 TABLET BY MOUTH DAILY (Patient taking differently: Take 20 mg by mouth daily.) 90 tablet 0   Calcium Citrate (CAL-CITRATE PO) Take 600 mg by mouth daily.     carvedilol (COREG) 25  MG tablet TAKE 1 TABLET BY MOUTH 2 TIMES DAILY WITH A MEAL 180 tablet 1   diltiazem (CARDIZEM CD) 180 MG 24 hr capsule TAKE 1 CAPSULE BY MOUTH DAILY (Patient taking differently: Take 180 mg by mouth daily.) 90 capsule 2   donepezil (ARICEPT) 5 MG tablet TAKE 1 TABLET BY MOUTH DAILY AT  BEDTIME (Patient taking differently: Take 5 mg by mouth at bedtime.) 90 tablet 1   ELIQUIS 2.5 MG TABS tablet TAKE 1 TABLET BY MOUTH 2 TIMES DAILY 180 tablet 1   Ferrous Sulfate ([employer] SLOW RELEASE IRON PO) Take 325 mg by mouth daily.     Fish Oil-Cholecalciferol (FISH OIL + D3) 1000-1000 MG-UNIT CAPS Take 1 capsule by mouth daily.     FLUoxetine (PROZAC) 10 MG capsule TAKE 1 CAPSULE BY MOUTH ONCE DAILY (Patient taking differently: Take 10 mg by mouth daily.) 90 capsule 1   furosemide (LASIX) 20 MG tablet Take 1 tablet (20 mg total) by mouth daily. 90 tablet 3   hydrALAZINE (APRESOLINE) 50 MG tablet Take 1 tablet (50 mg total) by mouth 2 (two) times daily. 180 tablet 1   levothyroxine (SYNTHROID) 25 MCG tablet TAKE 1 TABLET BY MOUTH ONCE DAILY ON AN EMPTY STOMACH 30 MINUTES BEFORE BREAKFAST 90 tablet 3   memantine (NAMENDA) 10 MG tablet Take 1 tablet (10 mg total) by mouth 2 (two) times daily. 180 tablet 1   Multiple Vitamin (MULTIVITAMIN) tablet Take 1 tablet by mouth daily. Unknown strength     omeprazole (PRILOSEC) 20 MG capsule TAKE 1 CAPSULE BY MOUTH DAILY (Patient taking differently: Take 20 mg by mouth daily.) 90 capsule 3   sodium chloride 1 g tablet Take 1 g by mouth daily.     Vitamin D, Ergocalciferol, (DRISDOL) 1.25 MG (50000 UNIT) CAPS capsule Take 1 capsule (50,000 Units total) by mouth 2 (two) times a week. 24 capsule 1   No current facility-administered medications for this visit.     ASSESSMENT & PLAN:   Assessment:   Newly diagnosed left renal caricnoma with a mass measuring approximately 16.9 x 12.5 cm in greatest AP and transverse dimensions and extends for approximately 24 cm in craniocaudad projection.  We have no tissue biopsy.  Unfortunately with her age and multiple comorbidities, including dementia, she is not a candidate for surgical resection.  I would not recommend chemotherapy or immunotherapy.    Renal insufficiency, with a worsening creatinine of 2.0.  This seems  partially due to dehydration.  We will administer 1 L of normal saline this week.  Hypokalemia.  [name] currently on Lasix 20 mg daily, so I advised that she decrease her dose to 20 mg every other day.  I will place her on oral potassium supplement 20 meq daily and we will administer 20 meq IV later this week.  Anemia, improved with oral iron, which she will continue.  Leukopenia and neutropenia.  I will check B12 and folate for further evaluation.  We will continue to monitor.  Hypoxia with a oxygen saturation down to 82% in clinic off of portable oxygen.  [name] normally on supplemental oxygen 1 L 24 hours per day.    Dementia.  [name] able to answer questions accordingly but relies on her son as a historian.  8.   Decreased appetite and weight loss, at least 8 1/2 pounds recently, over the last 5 weeks.  We could consider an appetite stimulant but Megace would increase her risk for thrombosis and steroids would  have many potential toxicities, including edema.  We could consider Remeron but she is already on Prozac and 2 medications for her dementia so we will hold off.  Plan: This is a pleasant 87 year old female, recently diagnosed with left renal carcinoma.  Due to her multiple comorbidities, she is not deemed a good surgical candidate, and I concur.  Unfortunately, chemotherapy or immunotherapy are not without toxicities, and currently her quality of life is good.  She and her son are in agreement to proceed with supportive care.  If needed, we could consider Hospice if she started to decline, and their services were reviewed.  We discussed code status, and she does not wish to be resuscitated in the case of a cardiopulmonary event.  Therefore, I filled out a DNR form for them today.  Later this week, we will administer IV potassium 20 meq due to hypokalemia as well as 1 L of normal saline to address her dehydration.   I advised that she decrease her Lasix dose to 20 mg every other day, and I  will place her on oral potassium supplement 20 meq daily.  She knows to continue oral iron supplement daily.  Otherwise, we will see her back in 1 month with CBC and CMP for repeat evaluation.  She and her son understand and agree with this plan of care.  I have answered their questions and they know to call with any concerns.  Thank you for the opportunity to participate in the care of your patients   I provided 50 minutes of face-to-face time during this this encounter and > 50% was spent counseling as documented under my assessment and plan.    Derwood Kaplan, MD [facility] [address] [place] [address] Dept: [phone] Dept Fax: [phone]   I, Rita Ohara, am acting as scribe for Derwood Kaplan, MD  I have reviewed this report as typed by the medical scribe, and it is complete and accurate.

## 2021-01-14 ENCOUNTER — Other Ambulatory Visit: Payer: Self-pay | Admitting: Oncology

## 2021-01-14 ENCOUNTER — Other Ambulatory Visit: Payer: Self-pay | Admitting: Pharmacist

## 2021-01-14 ENCOUNTER — Inpatient Hospital Stay: Payer: Medicare Other | Attending: Oncology | Admitting: Oncology

## 2021-01-14 ENCOUNTER — Inpatient Hospital Stay: Payer: Medicare Other

## 2021-01-14 ENCOUNTER — Telehealth: Payer: Self-pay | Admitting: Oncology

## 2021-01-14 ENCOUNTER — Other Ambulatory Visit: Payer: Self-pay | Admitting: Hematology and Oncology

## 2021-01-14 ENCOUNTER — Encounter: Payer: Self-pay | Admitting: Oncology

## 2021-01-14 DIAGNOSIS — T502X5A Adverse effect of carbonic-anhydrase inhibitors, benzothiadiazides and other diuretics, initial encounter: Secondary | ICD-10-CM

## 2021-01-14 DIAGNOSIS — R634 Abnormal weight loss: Secondary | ICD-10-CM

## 2021-01-14 DIAGNOSIS — D631 Anemia in chronic kidney disease: Secondary | ICD-10-CM

## 2021-01-14 DIAGNOSIS — N184 Chronic kidney disease, stage 4 (severe): Secondary | ICD-10-CM

## 2021-01-14 DIAGNOSIS — E876 Hypokalemia: Secondary | ICD-10-CM | POA: Diagnosis not present

## 2021-01-14 DIAGNOSIS — N2889 Other specified disorders of kidney and ureter: Secondary | ICD-10-CM

## 2021-01-14 DIAGNOSIS — R630 Anorexia: Secondary | ICD-10-CM

## 2021-01-14 DIAGNOSIS — D709 Neutropenia, unspecified: Secondary | ICD-10-CM

## 2021-01-14 DIAGNOSIS — E860 Dehydration: Secondary | ICD-10-CM | POA: Insufficient documentation

## 2021-01-14 DIAGNOSIS — F0280 Dementia in other diseases classified elsewhere without behavioral disturbance: Secondary | ICD-10-CM

## 2021-01-14 DIAGNOSIS — R0902 Hypoxemia: Secondary | ICD-10-CM

## 2021-01-14 HISTORY — DX: Dehydration: E86.0

## 2021-01-14 LAB — BASIC METABOLIC PANEL
BUN: 24 — AB (ref 4–21)
CO2: 33 — AB (ref 13–22)
Chloride: 100 (ref 99–108)
Creatinine: 2 — AB (ref 0.5–1.1)
Glucose: 126
Potassium: 3 — AB (ref 3.4–5.3)
Sodium: 141 (ref 137–147)

## 2021-01-14 LAB — COMPREHENSIVE METABOLIC PANEL
Albumin: 3.5 (ref 3.5–5.0)
Calcium: 8.6 — AB (ref 8.7–10.7)

## 2021-01-14 LAB — CBC AND DIFFERENTIAL
HCT: 32 — AB (ref 36–46)
Hemoglobin: 10.5 — AB (ref 12.0–16.0)
Neutrophils Absolute: 1.37
Platelets: 141 — AB (ref 150–399)
WBC: 2.4

## 2021-01-14 LAB — HEPATIC FUNCTION PANEL
ALT: 12 (ref 7–35)
AST: 20 (ref 13–35)
Alkaline Phosphatase: 60 (ref 25–125)
Bilirubin, Total: 0.7

## 2021-01-14 LAB — VITAMIN B12: Vitamin B-12: 276 pg/mL (ref 180–914)

## 2021-01-14 LAB — FOLATE: Folate: 11.2 ng/mL (ref >5.9)

## 2021-01-14 LAB — CBC: RBC: 3.78 — AB (ref 3.87–5.11)

## 2021-01-14 LAB — LACTATE DEHYDROGENASE: LDH: 106 U/L (ref 98–192)

## 2021-01-14 MED ORDER — POTASSIUM CHLORIDE CRYS ER 20 MEQ PO TBCR: 20.0000 meq | EXTENDED_RELEASE_TABLET | Freq: Two times a day (BID) | ORAL | 5 refills | Status: DC

## 2021-01-14 NOTE — Telephone Encounter (Signed)
Per 8/31 LOS, patient scheduled for 9/1 IV Pot/NS - Sept Appt's.  Gave patient Appt Summary

## 2021-01-15 ENCOUNTER — Inpatient Hospital Stay: Payer: Medicare Other | Attending: Oncology

## 2021-01-15 ENCOUNTER — Encounter: Payer: Self-pay | Admitting: Oncology

## 2021-01-15 ENCOUNTER — Other Ambulatory Visit: Payer: Self-pay

## 2021-01-15 VITALS — BP 138/57 | HR 68 | Temp 98.1°F | Resp 20 | Ht 63.0 in | Wt 141.0 lb

## 2021-01-15 DIAGNOSIS — D709 Neutropenia, unspecified: Secondary | ICD-10-CM | POA: Insufficient documentation

## 2021-01-15 DIAGNOSIS — E860 Dehydration: Secondary | ICD-10-CM

## 2021-01-15 DIAGNOSIS — E876 Hypokalemia: Secondary | ICD-10-CM

## 2021-01-15 MED ORDER — SODIUM CHLORIDE 0.9 % IV SOLN: Freq: Once | INTRAVENOUS | Status: AC

## 2021-01-15 MED ORDER — POTASSIUM CHLORIDE 10 MEQ/100ML IV SOLN
10.0000 meq | INTRAVENOUS | Status: AC
  Administered 2021-01-15 (×2): 10 meq via INTRAVENOUS
  Filled 2021-01-15 (×2): qty 100

## 2021-01-15 NOTE — Patient Instructions (Signed)
Dehydration, Adult Dehydration is condition in which there is not enough water or other fluids in the body. This happens when a person loses more fluids than he or she takes in. Important body parts cannot work right without the right amount of fluids. Any loss of fluids from the body can cause dehydration. Dehydration can be mild, worse, or very bad. It should be treated right away to keep it from getting very bad. What are the causes? This condition may be caused by: Conditions that cause loss of water or other fluids, such as: Watery poop (diarrhea). Vomiting. Sweating a lot. Peeing (urinating) a lot. Not drinking enough fluids, especially when you: Are ill. Are doing things that take a lot of energy to do. Other illnesses and conditions, such as fever or infection. Certain medicines, such as medicines that take extra fluid out of the body (diuretics). Lack of safe drinking water. Not being able to get enough water and food. What increases the risk? The following factors may make you more likely to develop this condition: Having a long-term (chronic) illness that has not been treated the right way, such as: Diabetes. Heart disease. Kidney disease. Being 74 years of age or older. Having a disability. Living in a place that is high above the ground or sea (high in altitude). The thinner, dried air causes more fluid loss. Doing exercises that put stress on your body for a long time. What are the signs or symptoms? Symptoms of dehydration depend on how bad it is. Mild or worse dehydration Thirst. Dry lips or dry mouth. Feeling dizzy or light-headed, especially when you stand up from sitting. Muscle cramps. Your body making: Dark pee (urine). Pee may be the color of tea. Less pee than normal. Less tears than normal. Headache. Very bad dehydration Changes in skin. Skin may: Be cold to the touch (clammy). Be blotchy or pale. Not go back to normal right after you lightly pinch  it and let it go. Little or no tears, pee, or sweat. Changes in vital signs, such as: Fast breathing. Low blood pressure. Weak pulse. Pulse that is more than 100 beats a minute when you are sitting still. Other changes, such as: Feeling very thirsty. Eyes that look hollow (sunken). Cold hands and feet. Being mixed up (confused). Being very tired (lethargic) or having trouble waking from sleep. Short-term weight loss. Loss of consciousness. How is this treated? Treatment for this condition depends on how bad it is. Treatment should start right away. Do not wait until your condition gets very bad. Very bad dehydration is an emergency. You will need to go to a hospital. Mild or worse dehydration can be treated at home. You may be asked to: Drink more fluids. Drink an oral rehydration solution (ORS). This drink helps get the right amounts of fluids and salts and minerals in the blood (electrolytes). Very bad dehydration can be treated: With fluids through an IV tube. By getting normal levels of salts and minerals in your blood. This is often done by giving salts and minerals through a tube. The tube is passed through your nose and into your stomach. By treating the root cause. Follow these instructions at home: Oral rehydration solution If told by your doctor, drink an ORS: Make an ORS. Use instructions on the package. Start by drinking small amounts, about  cup (120 mL) every 5-10 minutes. Slowly drink more until you have had the amount that your doctor said to have. Eating and drinking  Drink enough clear fluid to keep your pee pale yellow. If you were told to drink an ORS, finish the ORS first. Then, start slowly drinking other clear fluids. Drink fluids such as: Water. Do not drink only water. Doing that can make the salt (sodium) level in your body get too low. Water from ice chips you suck on. Fruit juice that you have added water to (diluted). Low-calorie sports  drinks. Eat foods that have the right amounts of salts and minerals, such as: Bananas. Oranges. Potatoes. Tomatoes. Spinach. Do not drink alcohol. Avoid: Drinks that have a lot of sugar. These include: High-calorie sports drinks. Fruit juice that you did not add water to. Soda. Caffeine. Foods that are greasy or have a lot of fat or sugar. General instructions Take over-the-counter and prescription medicines only as told by your doctor. Do not take salt tablets. Doing that can make the salt level in your body get too high. Return to your normal activities as told by your doctor. Ask your doctor what activities are safe for you. Keep all follow-up visits as told by your doctor. This is important. Contact a doctor if: You have pain in your belly (abdomen) and the pain: Gets worse. Stays in one place. You have a rash. You have a stiff neck. You get angry or annoyed (irritable) more easily than normal. You are more tired or have a harder time waking than normal. You feel: Weak or dizzy. Very thirsty. Get help right away if you have: Any symptoms of very bad dehydration. Symptoms of vomiting, such as: You cannot eat or drink without vomiting. Your vomiting gets worse or does not go away. Your vomit has blood or green stuff in it. Symptoms that get worse with treatment. A fever. A very bad headache. Problems with peeing or pooping (having a bowel movement), such as: Watery poop that gets worse or does not go away. Blood in your poop (stool). This may cause poop to look black and tarry. Not peeing in 6-8 hours. Peeing only a small amount of very dark pee in 6-8 hours. Trouble breathing. These symptoms may be an emergency. Do not wait to see if the symptoms will go away. Get medical help right away. Call your local emergency services (911 in the [place] Do not drive yourself to the hospital. Summary Dehydration is a condition in which there is not enough water or other fluids  in the body. This happens when a person loses more fluids than he or she takes in. Treatment for this condition depends on how bad it is. Treatment should be started right away. Do not wait until your condition gets very bad. Drink enough clear fluid to keep your pee pale yellow. If you were told to drink an oral rehydration solution (ORS), finish the ORS first. Then, start slowly drinking other clear fluids. Take over-the-counter and prescription medicines only as told by your doctor. Get help right away if you have any symptoms of very bad dehydration. This information is not intended to replace advice given to you by your health care provider. Make sure you discuss any questions you have with your health care provider. Document Revised: 12/14/2018 Document Reviewed: 12/14/2018 Elsevier Patient Education  [place]. Hypokalemia Hypokalemia means that the amount of potassium in the blood is lower than normal. Potassium is a chemical (electrolyte) that helps regulate the amount of fluid in the body. It also stimulates muscle tightening (contraction) and helps nerves work properly. Normally, most of the body's  potassium is inside cells, and only a very small amount is in the blood. Because the amount in the blood is so small, minor changes to potassium levels in the blood can be life-threatening. What are the causes? This condition may be caused by: Antibiotic medicine. Diarrhea or vomiting. Taking too much of a medicine that helps you have a bowel movement (laxative) can cause diarrhea and lead to hypokalemia. Chronic kidney disease (CKD). Medicines that help the body get rid of excess fluid (diuretics). Eating disorders, such as bulimia. Low magnesium levels in the body. Sweating a lot. What are the signs or symptoms? Symptoms of this condition include: Weakness. Constipation. Fatigue. Muscle cramps. Mental confusion. Skipped heartbeats or irregular heartbeat  (palpitations). Tingling or numbness. How is this diagnosed? This condition is diagnosed with a blood test. How is this treated? This condition may be treated by: Taking potassium supplements by mouth. Adjusting the medicines that you take. Eating more foods that contain a lot of potassium. If your potassium level is very low, you may need to get potassium through an IV and be monitored in the hospital. Follow these instructions at home:  Take over-the-counter and prescription medicines only as told by your health care provider. This includes vitamins and supplements. Eat a healthy diet. A healthy diet includes fresh fruits and vegetables, whole grains, healthy fats, and lean proteins. If instructed, eat more foods that contain a lot of potassium. This includes: Nuts, such as peanuts and pistachios. Seeds, such as sunflower seeds and pumpkin seeds. Peas, lentils, and lima beans. Whole grain and bran cereals and breads. Fresh fruits and vegetables, such as apricots, avocado, bananas, cantaloupe, kiwi, oranges, tomatoes, asparagus, and potatoes. Orange juice. Tomato juice. Red meats. Yogurt. Keep all follow-up visits as told by your health care provider. This is important. Contact a health care provider if you: Have weakness that gets worse. Feel your heart pounding or racing. Vomit. Have diarrhea. Have diabetes (diabetes mellitus) and you have trouble keeping your blood sugar (glucose) in your target range. Get help right away if you: Have chest pain. Have shortness of breath. Have vomiting or diarrhea that lasts for more than 2 days. Faint. Summary Hypokalemia means that the amount of potassium in the blood is lower than normal. This condition is diagnosed with a blood test. Hypokalemia may be treated by taking potassium supplements, adjusting the medicines that you take, or eating more foods that are high in potassium. If your potassium level is very low, you may need to get  potassium through an IV and be monitored in the hospital. This information is not intended to replace advice given to you by your health care provider. Make sure you discuss any questions you have with your health care provider. Document Revised: 12/13/2017 Document Reviewed: 12/14/2017 Elsevier Patient Education  [place].

## 2021-01-15 NOTE — Addendum Note (Signed)
Addended by: Juanetta Beets on: 01/15/2021 01:24 PM   Modules accepted: Orders

## 2021-01-16 ENCOUNTER — Other Ambulatory Visit: Payer: Self-pay | Admitting: Family Medicine

## 2021-01-16 DIAGNOSIS — F32A Depression, unspecified: Secondary | ICD-10-CM | POA: Diagnosis not present

## 2021-01-16 DIAGNOSIS — Z8673 Personal history of transient ischemic attack (TIA), and cerebral infarction without residual deficits: Secondary | ICD-10-CM | POA: Diagnosis not present

## 2021-01-16 DIAGNOSIS — E7800 Pure hypercholesterolemia, unspecified: Secondary | ICD-10-CM | POA: Diagnosis not present

## 2021-01-16 DIAGNOSIS — N170 Acute kidney failure with tubular necrosis: Secondary | ICD-10-CM | POA: Diagnosis not present

## 2021-01-16 DIAGNOSIS — F419 Anxiety disorder, unspecified: Secondary | ICD-10-CM | POA: Diagnosis not present

## 2021-01-16 DIAGNOSIS — I129 Hypertensive chronic kidney disease with stage 1 through stage 4 chronic kidney disease, or unspecified chronic kidney disease: Secondary | ICD-10-CM | POA: Diagnosis not present

## 2021-01-16 DIAGNOSIS — D509 Iron deficiency anemia, unspecified: Secondary | ICD-10-CM | POA: Diagnosis not present

## 2021-01-16 DIAGNOSIS — F0390 Unspecified dementia without behavioral disturbance: Secondary | ICD-10-CM | POA: Diagnosis not present

## 2021-01-16 DIAGNOSIS — Z9981 Dependence on supplemental oxygen: Secondary | ICD-10-CM | POA: Diagnosis not present

## 2021-01-16 DIAGNOSIS — E860 Dehydration: Secondary | ICD-10-CM | POA: Diagnosis not present

## 2021-01-16 DIAGNOSIS — I480 Paroxysmal atrial fibrillation: Secondary | ICD-10-CM | POA: Diagnosis not present

## 2021-01-16 DIAGNOSIS — N189 Chronic kidney disease, unspecified: Secondary | ICD-10-CM | POA: Diagnosis not present

## 2021-01-16 DIAGNOSIS — N2889 Other specified disorders of kidney and ureter: Secondary | ICD-10-CM | POA: Diagnosis not present

## 2021-01-16 DIAGNOSIS — M19079 Primary osteoarthritis, unspecified ankle and foot: Secondary | ICD-10-CM | POA: Diagnosis not present

## 2021-01-16 DIAGNOSIS — J449 Chronic obstructive pulmonary disease, unspecified: Secondary | ICD-10-CM | POA: Diagnosis not present

## 2021-01-16 DIAGNOSIS — I080 Rheumatic disorders of both mitral and aortic valves: Secondary | ICD-10-CM | POA: Diagnosis not present

## 2021-01-18 ENCOUNTER — Encounter: Payer: Self-pay | Admitting: Oncology

## 2021-01-20 ENCOUNTER — Telehealth: Payer: Self-pay

## 2021-01-20 NOTE — Telephone Encounter (Signed)
-----   Message from Derwood Kaplan, MD sent at 01/18/2021  3:10 PM EDT ----- Regarding: call son Can tell him vitamin levels are okay

## 2021-01-20 NOTE — Telephone Encounter (Signed)
Pt's daughter called to report that since pt's lasix was decreased last week, that her mom has more swelling and shortness of breath. I told her that her moms potassium was low last week, (3.0) , that was reasoning behind lowering the lasix and starting the potassium BID. Pt's dtr is a Marine scientist, so she understood everything I was saying. I told her that Dr Hinton Rao doesn't work on Tuesday afternoons, but that I would touch base with her in the morning. Marcie asked me to return call to her on Thursday, as she is in [place] tomorrow. Marcie also wanted to know how things are done, once her mom has reached the point where they think Hospice or palliative care is needed. I told her she would just need to notify us and we can refer Hospice to come and discuss their services @ anytime. She was grateful to hear this.

## 2021-01-20 NOTE — Telephone Encounter (Signed)
Richardson Landry from [facility] called and left voicemail stating that he was requesting orders to do PT with the patient once weekly for 9 weeks and Per Dr. Tobie Poet this is ok to give verbal order. Called Richardson Landry back and left message stating that Dr. Tobie Poet gives the ok on this order that is needed and also stated that if he has anymore questions or concerns to give [place] a call back.

## 2021-01-21 ENCOUNTER — Other Ambulatory Visit: Payer: Self-pay | Admitting: Oncology

## 2021-01-21 ENCOUNTER — Encounter: Payer: Self-pay | Admitting: Oncology

## 2021-01-21 ENCOUNTER — Telehealth: Payer: Self-pay

## 2021-01-21 DIAGNOSIS — N170 Acute kidney failure with tubular necrosis: Secondary | ICD-10-CM | POA: Diagnosis not present

## 2021-01-21 DIAGNOSIS — F32A Depression, unspecified: Secondary | ICD-10-CM | POA: Diagnosis not present

## 2021-01-21 DIAGNOSIS — E7800 Pure hypercholesterolemia, unspecified: Secondary | ICD-10-CM | POA: Diagnosis not present

## 2021-01-21 DIAGNOSIS — N2889 Other specified disorders of kidney and ureter: Secondary | ICD-10-CM | POA: Diagnosis not present

## 2021-01-21 DIAGNOSIS — F0390 Unspecified dementia without behavioral disturbance: Secondary | ICD-10-CM | POA: Diagnosis not present

## 2021-01-21 DIAGNOSIS — Z9981 Dependence on supplemental oxygen: Secondary | ICD-10-CM | POA: Diagnosis not present

## 2021-01-21 DIAGNOSIS — I080 Rheumatic disorders of both mitral and aortic valves: Secondary | ICD-10-CM | POA: Diagnosis not present

## 2021-01-21 DIAGNOSIS — Z8673 Personal history of transient ischemic attack (TIA), and cerebral infarction without residual deficits: Secondary | ICD-10-CM | POA: Diagnosis not present

## 2021-01-21 DIAGNOSIS — I129 Hypertensive chronic kidney disease with stage 1 through stage 4 chronic kidney disease, or unspecified chronic kidney disease: Secondary | ICD-10-CM | POA: Diagnosis not present

## 2021-01-21 DIAGNOSIS — E876 Hypokalemia: Secondary | ICD-10-CM

## 2021-01-21 DIAGNOSIS — D509 Iron deficiency anemia, unspecified: Secondary | ICD-10-CM | POA: Diagnosis not present

## 2021-01-21 DIAGNOSIS — M19079 Primary osteoarthritis, unspecified ankle and foot: Secondary | ICD-10-CM | POA: Diagnosis not present

## 2021-01-21 DIAGNOSIS — E860 Dehydration: Secondary | ICD-10-CM | POA: Diagnosis not present

## 2021-01-21 DIAGNOSIS — N189 Chronic kidney disease, unspecified: Secondary | ICD-10-CM | POA: Diagnosis not present

## 2021-01-21 DIAGNOSIS — F419 Anxiety disorder, unspecified: Secondary | ICD-10-CM | POA: Diagnosis not present

## 2021-01-21 DIAGNOSIS — I480 Paroxysmal atrial fibrillation: Secondary | ICD-10-CM | POA: Diagnosis not present

## 2021-01-21 DIAGNOSIS — J449 Chronic obstructive pulmonary disease, unspecified: Secondary | ICD-10-CM | POA: Diagnosis not present

## 2021-01-21 NOTE — Telephone Encounter (Signed)
Patient's son notified.

## 2021-01-21 NOTE — Telephone Encounter (Signed)
-----   Message from Derwood Kaplan, MD sent at 01/18/2021  3:10 PM EDT ----- Regarding: call son Can tell him vitamin levels are okay

## 2021-01-21 NOTE — Telephone Encounter (Addendum)
I spoke with pt's son [name] & scheduled next weeks lab appt for 01/29/21.  Derwood Kaplan, MD  Dairl Ponder, RN See previous note, to resume Lasix daily.  I put in order for BMP for 9/15 but anytime mid September, will need lab appt (unless she has home health)          01/21/21 - Pt's daughter notified of below. She ask that I also call her brother, [name], with below information and to schedule the lab appt with him, as he brings her for appt's.  ----- Message from Derwood Kaplan, MD sent at 01/21/2021  7:35 AM EDT ----- Regarding: RE: Increased swelling & SOB, since lasix decreased  She can go back to Lasix 20 mg daily, had no edema when I saw her last week.  Should prob recheck BMP next week if she could come in for that.  We discussed DNR and Hospice when I saw her but she didn't have much of symptoms to manage and we don't know how fast this will grow.  She could qualify for hospice anytime if they want to call in now, I was going to see how she did by checking monthly but if she declines, would rec we call them in.  I would continue to manage through hospice & explained some of that to the son.  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 I told her that her moms potassium was low last week, (3.0) , that was reasoning behind lowering the lasix and starting the potassium BID. Pt's dtr is a Marine scientist, so she understood everything I was saying. I told her that Dr Hinton Rao doesn't work on Tuesday afternoons, but that I would touch base with her in the morning. Marcie asked me to return call to her on Thursday, as she is  in [place] tomorrow. Marcie also wanted to know how things are done, once her mom has reached the point where they think Hospice or palliative care is needed. I told her she would just need to notify us and we can refer Hospice to come and discuss their services @ anytime. She was grateful to hear this.   She also wanted to know if radiation could be used to decrease the mass?

## 2021-01-24 ENCOUNTER — Other Ambulatory Visit: Payer: Self-pay | Admitting: Cardiology

## 2021-01-27 NOTE — Telephone Encounter (Signed)
Diltiazem 180 mg @ 90 only , sent to  [facility], [place]

## 2021-01-29 ENCOUNTER — Telehealth: Payer: Self-pay | Admitting: Oncology

## 2021-01-29 ENCOUNTER — Inpatient Hospital Stay: Payer: Medicare Other

## 2021-01-29 DIAGNOSIS — F32A Depression, unspecified: Secondary | ICD-10-CM | POA: Diagnosis not present

## 2021-01-29 DIAGNOSIS — F419 Anxiety disorder, unspecified: Secondary | ICD-10-CM | POA: Diagnosis not present

## 2021-01-29 DIAGNOSIS — D509 Iron deficiency anemia, unspecified: Secondary | ICD-10-CM | POA: Diagnosis not present

## 2021-01-29 DIAGNOSIS — N189 Chronic kidney disease, unspecified: Secondary | ICD-10-CM | POA: Diagnosis not present

## 2021-01-29 DIAGNOSIS — I129 Hypertensive chronic kidney disease with stage 1 through stage 4 chronic kidney disease, or unspecified chronic kidney disease: Secondary | ICD-10-CM | POA: Diagnosis not present

## 2021-01-29 DIAGNOSIS — Z8673 Personal history of transient ischemic attack (TIA), and cerebral infarction without residual deficits: Secondary | ICD-10-CM | POA: Diagnosis not present

## 2021-01-29 DIAGNOSIS — E7800 Pure hypercholesterolemia, unspecified: Secondary | ICD-10-CM | POA: Diagnosis not present

## 2021-01-29 DIAGNOSIS — Z9981 Dependence on supplemental oxygen: Secondary | ICD-10-CM | POA: Diagnosis not present

## 2021-01-29 DIAGNOSIS — N2889 Other specified disorders of kidney and ureter: Secondary | ICD-10-CM | POA: Diagnosis not present

## 2021-01-29 DIAGNOSIS — M19079 Primary osteoarthritis, unspecified ankle and foot: Secondary | ICD-10-CM | POA: Diagnosis not present

## 2021-01-29 DIAGNOSIS — J449 Chronic obstructive pulmonary disease, unspecified: Secondary | ICD-10-CM | POA: Diagnosis not present

## 2021-01-29 DIAGNOSIS — N170 Acute kidney failure with tubular necrosis: Secondary | ICD-10-CM | POA: Diagnosis not present

## 2021-01-29 DIAGNOSIS — E860 Dehydration: Secondary | ICD-10-CM | POA: Diagnosis not present

## 2021-01-29 DIAGNOSIS — I480 Paroxysmal atrial fibrillation: Secondary | ICD-10-CM | POA: Diagnosis not present

## 2021-01-29 DIAGNOSIS — I080 Rheumatic disorders of both mitral and aortic valves: Secondary | ICD-10-CM | POA: Diagnosis not present

## 2021-01-29 DIAGNOSIS — F0390 Unspecified dementia without behavioral disturbance: Secondary | ICD-10-CM | POA: Diagnosis not present

## 2021-01-29 NOTE — Telephone Encounter (Signed)
Patient's son called to rescheduled today's Lab Appt to 9/19 11:15 am

## 2021-01-29 NOTE — Telephone Encounter (Signed)
9/15/22Patients son called(R Dawson)and stated his mother had fallen.Lab appt cancelled for 01/29/21

## 2021-01-30 ENCOUNTER — Telehealth: Payer: Medicare Other

## 2021-02-02 ENCOUNTER — Other Ambulatory Visit: Payer: Self-pay | Admitting: Hematology and Oncology

## 2021-02-02 ENCOUNTER — Inpatient Hospital Stay: Payer: Medicare Other

## 2021-02-02 DIAGNOSIS — E876 Hypokalemia: Secondary | ICD-10-CM

## 2021-02-02 DIAGNOSIS — N2889 Other specified disorders of kidney and ureter: Secondary | ICD-10-CM | POA: Diagnosis not present

## 2021-02-02 LAB — BASIC METABOLIC PANEL
BUN: 28 — AB (ref 4–21)
CO2: 28 — AB (ref 13–22)
Chloride: 100 (ref 99–108)
Creatinine: 2.1 — AB (ref 0.5–1.1)
Glucose: 111
Potassium: 4 (ref 3.4–5.3)
Sodium: 136 — AB (ref 137–147)

## 2021-02-02 LAB — COMPREHENSIVE METABOLIC PANEL: Calcium: 8.5 — AB (ref 8.7–10.7)

## 2021-02-03 ENCOUNTER — Other Ambulatory Visit: Payer: Self-pay

## 2021-02-03 ENCOUNTER — Inpatient Hospital Stay: Payer: Medicare Other

## 2021-02-03 VITALS — BP 129/74 | HR 88 | Temp 98.1°F | Resp 18

## 2021-02-03 DIAGNOSIS — D709 Neutropenia, unspecified: Secondary | ICD-10-CM | POA: Diagnosis not present

## 2021-02-03 DIAGNOSIS — E860 Dehydration: Secondary | ICD-10-CM

## 2021-02-03 MED ORDER — SODIUM CHLORIDE 0.9 % IV SOLN: Freq: Once | INTRAVENOUS | Status: AC

## 2021-02-03 NOTE — Patient Instructions (Signed)

## 2021-02-04 DIAGNOSIS — F0390 Unspecified dementia without behavioral disturbance: Secondary | ICD-10-CM | POA: Diagnosis not present

## 2021-02-04 DIAGNOSIS — F32A Depression, unspecified: Secondary | ICD-10-CM | POA: Diagnosis not present

## 2021-02-04 DIAGNOSIS — E860 Dehydration: Secondary | ICD-10-CM | POA: Diagnosis not present

## 2021-02-04 DIAGNOSIS — F419 Anxiety disorder, unspecified: Secondary | ICD-10-CM | POA: Diagnosis not present

## 2021-02-04 DIAGNOSIS — N189 Chronic kidney disease, unspecified: Secondary | ICD-10-CM | POA: Diagnosis not present

## 2021-02-04 DIAGNOSIS — Z8673 Personal history of transient ischemic attack (TIA), and cerebral infarction without residual deficits: Secondary | ICD-10-CM | POA: Diagnosis not present

## 2021-02-04 DIAGNOSIS — I129 Hypertensive chronic kidney disease with stage 1 through stage 4 chronic kidney disease, or unspecified chronic kidney disease: Secondary | ICD-10-CM | POA: Diagnosis not present

## 2021-02-04 DIAGNOSIS — N2889 Other specified disorders of kidney and ureter: Secondary | ICD-10-CM | POA: Diagnosis not present

## 2021-02-04 DIAGNOSIS — M19079 Primary osteoarthritis, unspecified ankle and foot: Secondary | ICD-10-CM | POA: Diagnosis not present

## 2021-02-04 DIAGNOSIS — D509 Iron deficiency anemia, unspecified: Secondary | ICD-10-CM | POA: Diagnosis not present

## 2021-02-04 DIAGNOSIS — N170 Acute kidney failure with tubular necrosis: Secondary | ICD-10-CM | POA: Diagnosis not present

## 2021-02-04 DIAGNOSIS — E7800 Pure hypercholesterolemia, unspecified: Secondary | ICD-10-CM | POA: Diagnosis not present

## 2021-02-04 DIAGNOSIS — I480 Paroxysmal atrial fibrillation: Secondary | ICD-10-CM | POA: Diagnosis not present

## 2021-02-04 DIAGNOSIS — I080 Rheumatic disorders of both mitral and aortic valves: Secondary | ICD-10-CM | POA: Diagnosis not present

## 2021-02-04 DIAGNOSIS — J449 Chronic obstructive pulmonary disease, unspecified: Secondary | ICD-10-CM | POA: Diagnosis not present

## 2021-02-04 DIAGNOSIS — Z9981 Dependence on supplemental oxygen: Secondary | ICD-10-CM | POA: Diagnosis not present

## 2021-02-06 ENCOUNTER — Telehealth: Payer: Self-pay

## 2021-02-06 NOTE — Telephone Encounter (Signed)
Sherry Hess called with concerns about his Mother's declining health.  She currently lives with her son but is alone while he is at work.  Sherry Hess is concerned that in the near future she is going to require extended care or possibly Hospice Care.  Dr Tobie Poet feels that Sherry Hess would certainly qualify for Hospice and we would be glad to make the referral if the patient and her family decide this is what they want.

## 2021-02-11 DIAGNOSIS — M19079 Primary osteoarthritis, unspecified ankle and foot: Secondary | ICD-10-CM | POA: Diagnosis not present

## 2021-02-11 DIAGNOSIS — E7800 Pure hypercholesterolemia, unspecified: Secondary | ICD-10-CM | POA: Diagnosis not present

## 2021-02-11 DIAGNOSIS — J449 Chronic obstructive pulmonary disease, unspecified: Secondary | ICD-10-CM | POA: Diagnosis not present

## 2021-02-11 DIAGNOSIS — E860 Dehydration: Secondary | ICD-10-CM | POA: Diagnosis not present

## 2021-02-11 DIAGNOSIS — Z8673 Personal history of transient ischemic attack (TIA), and cerebral infarction without residual deficits: Secondary | ICD-10-CM | POA: Diagnosis not present

## 2021-02-11 DIAGNOSIS — Z9981 Dependence on supplemental oxygen: Secondary | ICD-10-CM | POA: Diagnosis not present

## 2021-02-11 DIAGNOSIS — D509 Iron deficiency anemia, unspecified: Secondary | ICD-10-CM | POA: Diagnosis not present

## 2021-02-11 DIAGNOSIS — N2889 Other specified disorders of kidney and ureter: Secondary | ICD-10-CM | POA: Diagnosis not present

## 2021-02-11 DIAGNOSIS — F0390 Unspecified dementia without behavioral disturbance: Secondary | ICD-10-CM | POA: Diagnosis not present

## 2021-02-11 DIAGNOSIS — I129 Hypertensive chronic kidney disease with stage 1 through stage 4 chronic kidney disease, or unspecified chronic kidney disease: Secondary | ICD-10-CM | POA: Diagnosis not present

## 2021-02-11 DIAGNOSIS — I480 Paroxysmal atrial fibrillation: Secondary | ICD-10-CM | POA: Diagnosis not present

## 2021-02-11 DIAGNOSIS — I080 Rheumatic disorders of both mitral and aortic valves: Secondary | ICD-10-CM | POA: Diagnosis not present

## 2021-02-11 DIAGNOSIS — F419 Anxiety disorder, unspecified: Secondary | ICD-10-CM | POA: Diagnosis not present

## 2021-02-11 DIAGNOSIS — F32A Depression, unspecified: Secondary | ICD-10-CM | POA: Diagnosis not present

## 2021-02-11 DIAGNOSIS — N189 Chronic kidney disease, unspecified: Secondary | ICD-10-CM | POA: Diagnosis not present

## 2021-02-11 DIAGNOSIS — N170 Acute kidney failure with tubular necrosis: Secondary | ICD-10-CM | POA: Diagnosis not present

## 2021-02-13 ENCOUNTER — Inpatient Hospital Stay: Payer: Medicare Other

## 2021-02-13 ENCOUNTER — Inpatient Hospital Stay: Payer: Medicare Other | Admitting: Oncology

## 2021-02-19 ENCOUNTER — Telehealth: Payer: Self-pay

## 2021-02-19 ENCOUNTER — Other Ambulatory Visit: Payer: Self-pay | Admitting: Physician Assistant

## 2021-02-19 NOTE — Telephone Encounter (Signed)
Daughter said patient is moving around some. Eating is ok. Drinking is ok. Patient has lost 2.4lb in a couple of days. Family wants to know if they need to continue to Lasix 20mg  daily as you have prescribed. Call Daughter Clovis Cao number [phone].

## 2021-02-23 ENCOUNTER — Telehealth: Payer: Self-pay

## 2021-02-23 NOTE — Chronic Care Management (AMB) (Signed)
Chronic Care Management Pharmacy Assistant   Name: Kailene Steinhart  MRN: [id number] DOB: May 10, 1931   Reason for Encounter: General Adherence Call    Recent office visits:  11/28/20 Rhae Hammock LPN. Seen for Acute cystitis. No med changes.  11/27/20 [name] MD. Seen for SOB. No med changes. Follow up as needed.  11/24/20 Rhae Hammock LPN. Transition of care telephone call. D/C Eliquis 2.5 mg at discharge of hospital.  Recent consult visits:  02/19/21 (Oncology) Telephone Message. Changed Lasix 20 mg daily to as needed.  02/03/21 (Oncology) Infusion appt. Administered IV fluids & Antiemetics of sodium chloride 500mg /hr.  01/15/21 (Oncology) Infusion appt. Administered sodium chloride 500 mL/hr, Sodium Chloride 10 mL/hr, and potassium chloride  10 mEq, 10 mEq  01/14/21 (Oncology) Orders Only. Ordered Potassium Chloride 20 meq. 1 tablet by mouth 2 times daily.  01/14/21 (Oncology) Hosie Poisson MD. Seen for Neutropenia. D/C Potassium Chloride 10 meq daily due to completed course. Return in 1 month.  12/09/20 (Cardiology) Orders Only. Ordered furosemide 20 mg daily.  12/08/20 (Cardiology) Radene Ou MD. Seen for SOB. No med changes. Follow up in 3 months.  Hospital visits:  None since 11/21/20  Medications: Outpatient Encounter Medications as of 02/23/2021  Medication Sig   atorvastatin (LIPITOR) 20 MG tablet TAKE 1 TABLET BY MOUTH DAILY   Calcium Citrate (CAL-CITRATE PO) Take 600 mg by mouth daily.   carvedilol (COREG) 25 MG tablet TAKE 1 TABLET BY MOUTH 2 TIMES DAILY WITH A MEAL   diltiazem (CARDIZEM CD) 180 MG 24 hr capsule TAKE 1 CAPSULE BY MOUTH DAILY   donepezil (ARICEPT) 5 MG tablet TAKE 1 TABLET BY MOUTH DAILY AT BEDTIME (Patient taking differently: Take 5 mg by mouth at bedtime.)   ELIQUIS 2.5 MG TABS tablet TAKE 1 TABLET BY MOUTH 2 TIMES DAILY   Ferrous Sulfate ([facility] SLOW RELEASE IRON PO) Take 325 mg by mouth daily.   Fish Oil-Cholecalciferol (FISH  OIL + D3) 1000-1000 MG-UNIT CAPS Take 1 capsule by mouth daily.   FLUoxetine (PROZAC) 10 MG capsule Take 1 capsule (10 mg total) by mouth daily.   furosemide (LASIX) 20 MG tablet Take 1 tablet (20 mg total) by mouth daily.   hydrALAZINE (APRESOLINE) 50 MG tablet Take 1 tablet (50 mg total) by mouth 2 (two) times daily.   levothyroxine (SYNTHROID) 25 MCG tablet TAKE 1 TABLET BY MOUTH ONCE DAILY ON AN EMPTY STOMACH 30 MINUTES BEFORE BREAKFAST   memantine (NAMENDA) 10 MG tablet Take 1 tablet (10 mg total) by mouth 2 (two) times daily.   Multiple Vitamin (MULTIVITAMIN) tablet Take 1 tablet by mouth daily. Unknown strength   omeprazole (PRILOSEC) 20 MG capsule TAKE 1 CAPSULE BY MOUTH DAILY   potassium chloride SA (KLOR-CON) 20 MEQ tablet Take 1 tablet (20 mEq total) by mouth 2 (two) times daily.   sodium chloride 1 g tablet Take 1 g by mouth daily.   Vitamin D, Ergocalciferol, (DRISDOL) 1.25 MG (50000 UNIT) CAPS capsule Take 1 capsule (50,000 Units total) by mouth 2 (two) times a week.   No facility-administered encounter medications on file as of 02/23/2021.   [place] for general disease state and medication adherence call.   Patient is not > 5 days past due for refill on the following medications per chart history:  [facility] Medications: Medication Name/mg Last Fill Days Supply Atorvastatin 20 mg  01/20/21  90ds   What concerns do you have about your medications? Pt stated she is not  having any issues   The patient denies side effects with her medications.   How often do you forget or accidentally miss a dose? Pt states she is real careful and does not miss any doses.  Do you use a pillbox? Pt states she is using a pillbox   Are you having any problems getting your medications from your pharmacy? No issues  Has the cost of your medications been a concern? Pt states no issues  Since last visit with CPP, no interventions have been made:   The patient has not had an ED  visit since last contact.   The patient denies problems with their health.    Patient states BP and BG readings are as follows: Pt states she is taking her BP at home but does not write them down.  Care Gaps: Last annual wellness visit? None noted  If applicable:N/A Last eye exam / retinopathy screening? Diabetic foot exam?   Elray Mcgregor, [place] Pharmacist Assistant  [phone]

## 2021-03-02 ENCOUNTER — Other Ambulatory Visit: Payer: Medicare Other

## 2021-03-02 ENCOUNTER — Ambulatory Visit: Payer: Medicare Other | Admitting: Oncology

## 2021-03-05 ENCOUNTER — Telehealth: Payer: Self-pay | Admitting: Oncology

## 2021-03-05 ENCOUNTER — Ambulatory Visit: Payer: Medicare Other | Admitting: Oncology

## 2021-03-05 ENCOUNTER — Other Ambulatory Visit: Payer: Medicare Other

## 2021-03-05 NOTE — Telephone Encounter (Signed)
Patient's son left message he was locked out of the house and could not get his mother to her Appt today.  Appt rescheduled to 11/3 Labs 11:00 am - Follow Up 11:30 am

## 2021-03-13 NOTE — Progress Notes (Signed)
[place]  [address] [place],  [place]  [address] [phone]  Clinic Day:  03/19/2021  Referring physician: [name], MD  This document serves as a record of services personally performed by Hosie Poisson, MD. It was created on their behalf by Sherry Hess, a trained medical scribe. The creation of this record is based on the scribe's personal observations and the provider's statements to them.  CHIEF COMPLAINT:  CC: Left renal mass  Current Treatment:  Observation   HISTORY OF PRESENT ILLNESS:  Sherry Hess is a 87 y.o. female referred by Dr. Joie Bimler for the evaluation and treatment of newly diagnosed left renal carcinoma.  This began when the patient presented to the emergency department in June due to weakness, acute renal failure and dehydration.  Initial BUN was 76 with a creatinine of 3.1.  CT chest, abdomen and pelvis revealed a large centrally necrotic mass lesion which appeared to arise from the upper pole of the left kidney, measuring approximately 16.9 x 12.5 cm in greatest AP and transverse dimensions and extends for approximately 24 cm in craniocaudad projection, consistent with renal cell carcinoma till proven otherwise.  She was administered IV fluids with improvement in her BUN to 25 and creatinine to 1.3 at the time of discharge.  She was also administered IV iron and she continues oral supplement.  She was referred to Dr. Nila Nephew for outpatient evaluation, and she was deemed not to be a surgical candidate due to her multiple comorbidities.  She has a medical history significant for degenerative disc disease, anemia, dementia, TIA and CVA, hypertension, congestive heart failure and syncope.  INTERVAL HISTORY:  Sherry Hess is here for routine follow up of kidney cancer, and states that she feels fairly well. She remains on supplemental oxygen, but denies shortness of breath. She also wears a Building services engineer. She denies pain or  hematuria. She continues Lasix daily and this may be part of the cause of her weight loss. She denies complaints today. White count has improved from 2.4 to 3.0 with an ANC of 1890, hemoglobin has improved from 10.5 to 11.5, and platelets are normal. Chemistries are unremarkable except for a BUN of 32, and a creatinine of 1.8, improved from 2.1. Her  appetite is fair, but she has lost 13 pounds since her last visit.  She denies fever, chills or other signs of infection.  She denies nausea, vomiting, bowel issues, or abdominal pain.  She denies sore throat, cough, dyspnea, or chest pain.  REVIEW OF SYSTEMS:  Review of Systems  Constitutional: Negative.  Negative for appetite change, chills, fatigue, fever and unexpected weight change.  HENT:  Negative.    Eyes: Negative.   Respiratory:  Negative for chest tightness, cough, hemoptysis, shortness of breath (on supplemental oxygen) and wheezing.   Cardiovascular: Negative.  Negative for chest pain, leg swelling and palpitations.  Gastrointestinal: Negative.  Negative for abdominal distention, abdominal pain, blood in stool, constipation, diarrhea, nausea and vomiting.  Endocrine: Negative.   Genitourinary: Negative.  Negative for difficulty urinating, dysuria, frequency and hematuria.   Musculoskeletal: Negative.  Negative for arthralgias, back pain, flank pain, gait problem and myalgias.  Skin: Negative.   Neurological: Negative.  Negative for dizziness, extremity weakness, gait problem, headaches, light-headedness, numbness, seizures and speech difficulty.  Hematological: Negative.   Psychiatric/Behavioral: Negative.  Negative for depression and sleep disturbance. The patient is not nervous/anxious.     VITALS:  Blood pressure 134/68, pulse 88, temperature 98.5  F (36.9 C), temperature source Oral, resp. rate 18, height 5\' 3"  (1.6 m), weight 127 lb 14.4 oz (58 kg), SpO2 93 %.  Wt Readings from Last 3 Encounters:  03/19/21 127 lb 14.4 oz (58 kg)   01/15/21 141 lb (64 kg)  01/14/21 132 lb 8 oz (60.1 kg)    Body mass index is 22.66 kg/m.  Performance status (ECOG): 1 - Symptomatic but completely ambulatory  PHYSICAL EXAM:  Physical Exam Constitutional:      General: [name] not in acute distress.    Appearance: Normal appearance. [name] normal weight.  HENT:     Head: Normocephalic and atraumatic.  Eyes:     General: No scleral icterus.    Extraocular Movements: Extraocular movements intact.     Conjunctiva/sclera: Conjunctivae normal.     Pupils: Pupils are equal, round, and reactive to light.  Cardiovascular:     Rate and Rhythm: Normal rate and regular rhythm.     Pulses: Normal pulses.     Heart sounds: Normal heart sounds. No murmur heard.   No friction rub. No gallop.  Pulmonary:     Effort: Pulmonary effort is normal. No respiratory distress.     Breath sounds: Decreased breath sounds (at the left base with fine crackles) present.  Abdominal:     General: Bowel sounds are normal. There is no distension.     Palpations: Abdomen is soft. There is mass (measuring about 13 cm below the left costal margin and does not extend far across the midline). There is no hepatomegaly or splenomegaly.     Tenderness: There is no abdominal tenderness.  Musculoskeletal:        General: Normal range of motion.     Cervical back: Normal range of motion and neck supple.     Right lower leg: No edema.     Left lower leg: No edema.  Lymphadenopathy:     Cervical: No cervical adenopathy.  Skin:    General: Skin is warm and dry.  Neurological:     General: No focal deficit present.     Mental Status: [name] alert and oriented to person, place, and time. Mental status is at baseline.  Psychiatric:        Mood and Affect: Mood normal.        Behavior: Behavior normal.        Thought Content: Thought content normal.        Judgment: Judgment normal.    LABS:   CBC Latest Ref Rng & Units 03/19/2021 01/14/2021 01/07/2021  WBC - 3.0  2.4 2.9(L)  Hemoglobin 12.0 - 16.0 11.5(A) 10.5(A) 10.3(L)  Hematocrit 36 - 46 36 32(A) 33.3(L)  Platelets 150 - 399 155 141(A) 191   CMP Latest Ref Rng & Units 03/19/2021 02/02/2021 01/14/2021  Glucose 65 - 99 mg/dL - - -  BUN 4 - 21 32(A) 28(A) 24(A)  Creatinine 0.5 - 1.1 1.8(A) 2.1(A) 2.0(A)  Sodium 137 - 147 137 136(A) 141  Potassium 3.4 - 5.3 4.7 4.0 3.0(A)  Chloride 99 - 108 101 100 100  CO2 13 - 22 28(A) 28(A) 33(A)  Calcium 8.7 - 10.7 9.1 8.5(A) 8.6(A)  Total Protein 6.0 - 8.5 g/dL - - -  Total Bilirubin 0.0 - 1.2 mg/dL - - -  Alkaline Phos 25 - 125 64 - 60  AST 13 - 35 32 - 20  ALT 7 - 35 10 - 12     Lab Results  Component Value  Date   CEA1 2.4 03/10/2017   /  CEA  Date Value Ref Range Status  03/10/2017 2.4 0.0 - 4.7 ng/mL Final    Comment:    (NOTE)       Roche ECLIA methodology       Nonsmokers  <3.9                                     Smokers     <5.6 Performed At: [facility] [place], [place] [id number] Lindon Romp MD QA:[id number]    Lab Results  Component Value Date   TIBC 301 03/10/2017   FERRITIN 132 03/10/2017   IRONPCTSAT 7 (L) 03/10/2017   Lab Results  Component Value Date   LDH 106 01/14/2021    STUDIES:  No results found.    HISTORY:   Allergies:  Allergies  Allergen Reactions   Minoxidil     rash   Norvasc [Amlodipine Besylate]     rash   Clonidine Derivatives Rash    [name]    Current Medications: Current Outpatient Medications  Medication Sig Dispense Refill   atorvastatin (LIPITOR) 20 MG tablet TAKE 1 TABLET BY MOUTH DAILY 90 tablet 1   Calcium Citrate (CAL-CITRATE PO) Take 600 mg by mouth daily.     carvedilol (COREG) 25 MG tablet TAKE 1 TABLET BY MOUTH 2 TIMES DAILY WITH A MEAL 180 tablet 1   diltiazem (CARDIZEM CD) 180 MG 24 hr capsule TAKE 1 CAPSULE BY MOUTH DAILY 90 capsule 0   donepezil (ARICEPT) 5 MG tablet TAKE 1 TABLET BY MOUTH DAILY AT BEDTIME (Patient taking  differently: Take 5 mg by mouth at bedtime.) 90 tablet 1   Ferrous Sulfate ([employer] SLOW RELEASE IRON PO) Take 325 mg by mouth daily.     Fish Oil-Cholecalciferol (FISH OIL + D3) 1000-1000 MG-UNIT CAPS Take 1 capsule by mouth daily.     FLUoxetine (PROZAC) 10 MG capsule Take 1 capsule (10 mg total) by mouth daily. 30 capsule 1   furosemide (LASIX) 20 MG tablet Take 1 tablet (20 mg total) by mouth daily. 90 tablet 3   hydrALAZINE (APRESOLINE) 50 MG tablet Take 1 tablet (50 mg total) by mouth 2 (two) times daily. 180 tablet 1   levothyroxine (SYNTHROID) 25 MCG tablet TAKE 1 TABLET BY MOUTH ONCE DAILY ON AN EMPTY STOMACH 30 MINUTES BEFORE BREAKFAST 90 tablet 3   memantine (NAMENDA) 10 MG tablet Take 1 tablet (10 mg total) by mouth 2 (two) times daily. 180 tablet 1   Multiple Vitamin (MULTIVITAMIN) tablet Take 1 tablet by mouth daily. Unknown strength     omeprazole (PRILOSEC) 20 MG capsule TAKE 1 CAPSULE BY MOUTH DAILY 90 capsule 3   potassium chloride SA (KLOR-CON) 20 MEQ tablet Take 1 tablet (20 mEq total) by mouth 2 (two) times daily. 30 tablet 5   sodium chloride 1 g tablet Take 1 g by mouth daily.     Vitamin D, Ergocalciferol, (DRISDOL) 1.25 MG (50000 UNIT) CAPS capsule Take 1 capsule (50,000 Units total) by mouth 2 (two) times a week. 24 capsule 1   No current facility-administered medications for this visit.     ASSESSMENT & PLAN:   Assessment:   Newly diagnosed left renal caricnoma with a mass measuring approximately 16.9 x 12.5 cm in greatest AP and transverse dimensions and extends for approximately 24 cm in craniocaudad projection.  We have no tissue biopsy.  Unfortunately with her age and multiple comorbidities, including dementia, she is not a candidate for surgical resection.  I would not recommend chemotherapy or immunotherapy.    Renal insufficiency, improved.    Hypokalemia.  [name] currently on oral potassium supplement 20 meq daily. As she has trouble with these, I will place  her on 10 meq BID.  Anemia, improved with oral iron, which she will continue.  Leukopenia, improved.  We will continue to monitor.  Hypoxia with a oxygen saturation down to 82% in clinic off portable oxygen.  [name] normally on supplemental oxygen 1 L 24 hours per day.    Dementia.  [name] able to answer questions accordingly but relies on her son as a historian.  8.   Decreased appetite and weight loss.  We could consider an appetite stimulant but Megace would increase her risk for thrombosis and steroids would have many potential toxicities, including edema.  We could consider Remeron but she is already on Prozac and 2 medications for her dementia so we will hold off.  Plan: We will continue with supportive care, and she is doing fairly well at this time.  If needed, we could consider Hospice if she started to decline, and their services have been reviewed. She knows to continue Lasix 20 mg every other day, oral potassium supplement 20 meq daily, and oral iron supplement daily.  Otherwise, we will see her back in 1 month with CBC and CMP for repeat evaluation.  [name] a DNR. She and her son understand and agree with this plan of care.  I have answered their questions and they know to call with any concerns.  I provided 20 minutes of face-to-face time during this this encounter and > 50% was spent counseling as documented under my assessment and plan.    Derwood Kaplan, MD [facility] AT [facility] [address] [place] [address] Dept: [phone] Dept Fax: [phone]   I, Rita Ohara, am acting as scribe for Derwood Kaplan, MD  I have reviewed this report as typed by the medical scribe, and it is complete and accurate.

## 2021-03-19 ENCOUNTER — Inpatient Hospital Stay: Payer: Medicare Other | Attending: Oncology

## 2021-03-19 ENCOUNTER — Other Ambulatory Visit: Payer: Self-pay | Admitting: Oncology

## 2021-03-19 ENCOUNTER — Inpatient Hospital Stay (INDEPENDENT_AMBULATORY_CARE_PROVIDER_SITE_OTHER): Payer: Medicare Other | Admitting: Oncology

## 2021-03-19 ENCOUNTER — Encounter: Payer: Self-pay | Admitting: Oncology

## 2021-03-19 VITALS — BP 134/68 | HR 88 | Temp 98.5°F | Resp 18 | Ht 63.0 in | Wt 127.9 lb

## 2021-03-19 DIAGNOSIS — N2889 Other specified disorders of kidney and ureter: Secondary | ICD-10-CM

## 2021-03-19 DIAGNOSIS — N184 Chronic kidney disease, stage 4 (severe): Secondary | ICD-10-CM | POA: Diagnosis not present

## 2021-03-19 DIAGNOSIS — T502X5A Adverse effect of carbonic-anhydrase inhibitors, benzothiadiazides and other diuretics, initial encounter: Secondary | ICD-10-CM

## 2021-03-19 DIAGNOSIS — D631 Anemia in chronic kidney disease: Secondary | ICD-10-CM

## 2021-03-19 DIAGNOSIS — E876 Hypokalemia: Secondary | ICD-10-CM

## 2021-03-19 LAB — BASIC METABOLIC PANEL
BUN: 32 — AB (ref 4–21)
CO2: 28 — AB (ref 13–22)
Chloride: 101 (ref 99–108)
Creatinine: 1.8 — AB (ref 0.5–1.1)
Glucose: 79
Potassium: 4.7 (ref 3.4–5.3)
Sodium: 137 (ref 137–147)

## 2021-03-19 LAB — CBC AND DIFFERENTIAL
HCT: 36 (ref 36–46)
Hemoglobin: 11.5 — AB (ref 12.0–16.0)
Neutrophils Absolute: 1.89
Platelets: 155 (ref 150–399)
WBC: 3

## 2021-03-19 LAB — HEPATIC FUNCTION PANEL
ALT: 10 (ref 7–35)
AST: 32 (ref 13–35)
Alkaline Phosphatase: 64 (ref 25–125)
Bilirubin, Total: 0.6

## 2021-03-19 LAB — CBC: RBC: 4.36 (ref 3.87–5.11)

## 2021-03-19 LAB — COMPREHENSIVE METABOLIC PANEL
Albumin: 3.8 (ref 3.5–5.0)
Calcium: 9.1 (ref 8.7–10.7)

## 2021-03-19 MED ORDER — POTASSIUM CHLORIDE CRYS ER 10 MEQ PO TBCR: 10.0000 meq | EXTENDED_RELEASE_TABLET | Freq: Two times a day (BID) | ORAL | 5 refills | Status: DC

## 2021-03-20 ENCOUNTER — Encounter: Payer: Self-pay | Admitting: Cardiology

## 2021-03-20 ENCOUNTER — Ambulatory Visit: Payer: Medicare Other | Admitting: Cardiology

## 2021-03-20 ENCOUNTER — Other Ambulatory Visit: Payer: Self-pay

## 2021-03-20 VITALS — BP 130/80 | HR 66 | Ht 63.0 in | Wt 127.0 lb

## 2021-03-20 DIAGNOSIS — I480 Paroxysmal atrial fibrillation: Secondary | ICD-10-CM

## 2021-03-20 DIAGNOSIS — C649 Malignant neoplasm of unspecified kidney, except renal pelvis: Secondary | ICD-10-CM | POA: Insufficient documentation

## 2021-03-20 DIAGNOSIS — E782 Mixed hyperlipidemia: Secondary | ICD-10-CM

## 2021-03-20 DIAGNOSIS — I10 Essential (primary) hypertension: Secondary | ICD-10-CM

## 2021-03-20 DIAGNOSIS — F0150 Vascular dementia without behavioral disturbance: Secondary | ICD-10-CM

## 2021-03-20 NOTE — Progress Notes (Signed)
Cardiology Office Note:    Date:  03/20/2021   ID:  Sherry Hess, DOB 01-27-1931, MRN [id number]  PCP:  [name], MD  Cardiologist:  [name], MD    Referring MD: [name], MD   Chief Complaint  Patient presents with   Follow-up  Doing fine  History of Present Illness:    Sherry Hess is a 87 y.o. female with past medical history significant for paroxysmal atrial fibrillation, essential hypertension, vascular dementia, dyslipidemia, renal cancer which was recently newly recognized however because of comorbidities she is being treated conservatively.  She comes today to my office for follow-up last time I seen her she did have some swelling of lower extremities as well as shortness of breath I gave her a little slightly higher dose of diuretic which seems to improve the situation however she ended up running into trouble because of hypokalemia and it was corrected with potassium supplementation as well as decreased dose of diuretic.  She comes today seems to be doing well she actually tells me she is feeling better she says she got more energy she cooks every evening and she have no problem doing that.  She comes like always to my office with her son.  Past Medical History:  Diagnosis Date   Anxiety    Benign neoplasm of rectum    Benign neoplasm of sigmoid colon    Chest pain in adult 12/27/2015   CKD (chronic kidney disease) stage 4, GFR 15-29 ml/min ([place]) 07/02/2019   CVA (cerebral vascular accident) ([place]) 03/08/2017   Dyspepsia 03/09/2017   Dyspepsia 03/09/2017   Essential hypertension 11/20/2014   Fatigue 12/10/2016   High cholesterol    HLD (hyperlipidemia) 03/09/2017   Hx of transient ischemic attack (TIA) 10/18/2016   Hypertension    Hypertension, renal disease, stage 1-4 or unspecified chronic kidney disease 07/02/2019   Mixed hyperlipidemia 03/09/2017   Normocytic anemia    Occult blood positive stool 03/09/2017   Paroxysmal atrial fibrillation  ([place]) 02/23/2017   Syncope 12/10/2016   TIA (transient ischemic attack)    Vascular dementia without behavioral disturbance ([place]) 07/02/2019    Past Surgical History:  Procedure Laterality Date   ABDOMINAL HYSTERECTOMY     COLONOSCOPY N/A 03/11/2017   Procedure: COLONOSCOPY;  Surgeon: Gatha Mayer, MD;  Location: [facility];  Service: Endoscopy;  Laterality: N/A;   ESOPHAGOGASTRODUODENOSCOPY N/A 03/11/2017   Procedure: ESOPHAGOGASTRODUODENOSCOPY (EGD);  Surgeon: Gatha Mayer, MD;  Location[facility] ENDOSCOPY;  Service: Endoscopy;  Laterality: N/A;   TONSILLECTOMY     VESICOVAGINAL FISTULA CLOSURE W/ TAH      Current Medications: Current Meds  Medication Sig   atorvastatin (LIPITOR) 20 MG tablet TAKE 1 TABLET BY MOUTH DAILY   Calcium Citrate (CAL-CITRATE PO) Take 600 mg by mouth daily.   carvedilol (COREG) 25 MG tablet TAKE 1 TABLET BY MOUTH 2 TIMES DAILY WITH A MEAL   diltiazem (CARDIZEM CD) 180 MG 24 hr capsule TAKE 1 CAPSULE BY MOUTH DAILY   donepezil (ARICEPT) 5 MG tablet TAKE 1 TABLET BY MOUTH DAILY AT BEDTIME   Ferrous Sulfate ([employer]S SLOW RELEASE IRON PO) Take 325 mg by mouth daily.   Fish Oil-Cholecalciferol (FISH OIL + D3) 1000-1000 MG-UNIT CAPS Take 1 capsule by mouth daily.   FLUoxetine (PROZAC) 10 MG capsule Take 1 capsule (10 mg total) by mouth daily.   furosemide (LASIX) 20 MG tablet Take 1 tablet (20 mg total) by mouth daily.   hydrALAZINE (APRESOLINE) 50 MG  tablet Take 1 tablet (50 mg total) by mouth 2 (two) times daily.   levothyroxine (SYNTHROID) 25 MCG tablet TAKE 1 TABLET BY MOUTH ONCE DAILY ON AN EMPTY STOMACH 30 MINUTES BEFORE BREAKFAST   memantine (NAMENDA) 10 MG tablet Take 1 tablet (10 mg total) by mouth 2 (two) times daily.   Multiple Vitamin (MULTIVITAMIN) tablet Take 1 tablet by mouth daily. Unknown strength   omeprazole (PRILOSEC) 20 MG capsule TAKE 1 CAPSULE BY MOUTH DAILY   potassium chloride (KLOR-CON) 10 MEQ tablet Take 1 tablet (10 mEq total) by mouth  2 (two) times daily.   sodium chloride 1 g tablet Take 1 g by mouth daily.   Vitamin D, Ergocalciferol, (DRISDOL) 1.25 MG (50000 UNIT) CAPS capsule Take 1 capsule (50,000 Units total) by mouth 2 (two) times a week.     Allergies:   Minoxidil, Norvasc [amlodipine besylate], and Clonidine derivatives   Social History   Socioeconomic History   Marital status: Widowed    Spouse name: Not on file   Number of children: 3   Years of education: Not on file   Highest education level: Not on file  Occupational History   Not on file  Tobacco Use   Smoking status: Never   Smokeless tobacco: Never  Vaping Use   Vaping Use: Never used  Substance and Sexual Activity   Alcohol use: No   Drug use: No   Sexual activity: Not on file  Other Topics Concern   Not on file  Social History Narrative   Not on file   Social Determinants of Health   Financial Resource Strain: Not on file  Food Insecurity: Not on file  Transportation Needs: Not on file  Physical Activity: Not on file  Stress: Not on file  Social Connections: Not on file     Family History: [name] family history includes Atrial fibrillation in her son; Cancer in her brother and father; Dementia in her brother; Stroke in her mother. There is no history of Ataxia, Chorea, Mental retardation, Migraines, Multiple sclerosis, Neurofibromatosis, Neuropathy, Parkinsonism, or Seizures. ROS:   Please see the history of present illness.    All 14 point review of systems negative except as described per history of present illness  EKGs/Labs/Other Studies Reviewed:      Recent Labs: 09/30/2020: TSH 3.900 01/07/2021: NT-Pro BNP 11,561 03/19/2021: ALT 10; BUN 32; Creatinine 1.8; Hemoglobin 11.5; Platelets 155; Potassium 4.7; Sodium 137  Recent Lipid Panel    Component Value Date/Time   CHOL 99 (L) 09/30/2020 1109   TRIG 63 09/30/2020 1109   HDL 33 (L) 09/30/2020 1109   CHOLHDL 3.0 09/30/2020 1109   CHOLHDL 3.3 03/09/2017 0226    VLDL 10 03/09/2017 0226   LDLCALC 52 09/30/2020 1109    Physical Exam:    VS:  BP 130/80 (BP Location: Left Arm, Patient Position: Sitting, Cuff Size: Normal)   Pulse 66   Ht 5\' 3"  (1.6 m)   Wt 127 lb (57.6 kg)   SpO2 93%   BMI 22.50 kg/m     Wt Readings from Last 3 Encounters:  03/20/21 127 lb (57.6 kg)  03/19/21 127 lb 14.4 oz (58 kg)  01/15/21 141 lb (64 kg)     GEN:  Well nourished, well developed in no acute distress HEENT: Normal NECK: No JVD; No carotid bruits LYMPHATICS: No lymphadenopathy CARDIAC: Irregular irregular, no murmurs, no rubs, no gallops RESPIRATORY:  Clear to auscultation without rales, wheezing or rhonchi  ABDOMEN: Soft, non-tender,  non-distended MUSCULOSKELETAL:  No edema; No deformity  SKIN: Warm and dry LOWER EXTREMITIES: no swelling NEUROLOGIC:  Alert and oriented x 3 PSYCHIATRIC:  Normal affect   ASSESSMENT:    1. Paroxysmal atrial fibrillation (HCC)   2. Essential hypertension   3. Vascular dementia without behavioral disturbance ([place])   4. Mixed hyperlipidemia   5. Malignant neoplasm of kidney, unspecified laterality (HCC)    PLAN:    In order of problems listed above:  Paroxysmal atrial fibrillation her rate is irregular today I suspect she is in atrial fibrillation however rate is controlled.  [name] not anticoagulated because of comorbidity as well as anemia as well as renal cancer.  We will continue conservative approach. Essential hypertension blood pressure well controlled continue present management. Vascular dementia only mild.  Noted. Dyslipidemia I did review K PN which show me LDL 52 HDL 33 this is from Sep 30, 2020.  Continue present management. Malignant neoplasm of kidneys.  That being follow-up blood cultures excellently by our oncology team.  Overall because of her age and fragility look like majority of care now is palliative.  [name] not a candidate for chemotherapy not candidate for surgery because of her renal  cancer.  I see her back in my office in about 3 months   Medication Adjustments/Labs and Tests Ordered: Current medicines are reviewed at length with the patient today.  Concerns regarding medicines are outlined above.  No orders of the defined types were placed in this encounter.  Medication changes: No orders of the defined types were placed in this encounter.   Signed, Park Liter, MD, [facility] 03/20/2021 10:32 AM    [place]

## 2021-03-20 NOTE — Patient Instructions (Signed)

## 2021-03-25 ENCOUNTER — Encounter: Payer: Self-pay | Admitting: Hematology and Oncology

## 2021-03-30 ENCOUNTER — Telehealth: Payer: Self-pay

## 2021-03-30 ENCOUNTER — Other Ambulatory Visit: Payer: Self-pay | Admitting: Oncology

## 2021-03-30 DIAGNOSIS — E876 Hypokalemia: Secondary | ICD-10-CM

## 2021-03-30 MED ORDER — POTASSIUM CHLORIDE CRYS ER 10 MEQ PO TBCR: 10.0000 meq | EXTENDED_RELEASE_TABLET | Freq: Two times a day (BID) | ORAL | 5 refills | Status: AC

## 2021-03-30 NOTE — Telephone Encounter (Addendum)
Randall notified, & verbalized understanding.  ----- Message from Derwood Kaplan, MD sent at 03/30/2021 10:04 AM EST ----- Regarding: RE: Potassium script Tell him I am sorry, it was an oversight.  We had changed from 20 to 2 of the 10 meq strength.  I have sent in new Rx ----- Message ----- From: Dairl Ponder, RN Sent: 03/30/2021   9:57 AM EST To: Derwood Kaplan, MD Subject: Potassium script                               Pt's son called to inquire, "why is mom only getting 2 weeks worth of potassium (30 tabs) @ a time? 60 tabs would be monthly dose".  eRx was sent for 30 tabs and 5 refills. Please advise

## 2021-04-03 ENCOUNTER — Ambulatory Visit (INDEPENDENT_AMBULATORY_CARE_PROVIDER_SITE_OTHER): Payer: Medicare Other | Admitting: Family Medicine

## 2021-04-03 ENCOUNTER — Ambulatory Visit (INDEPENDENT_AMBULATORY_CARE_PROVIDER_SITE_OTHER): Payer: Medicare Other

## 2021-04-03 ENCOUNTER — Other Ambulatory Visit: Payer: Self-pay

## 2021-04-03 VITALS — BP 92/54 | HR 64 | Temp 97.1°F | Resp 18 | Ht 63.0 in | Wt 125.6 lb

## 2021-04-03 DIAGNOSIS — C642 Malignant neoplasm of left kidney, except renal pelvis: Secondary | ICD-10-CM

## 2021-04-03 DIAGNOSIS — N184 Chronic kidney disease, stage 4 (severe): Secondary | ICD-10-CM

## 2021-04-03 DIAGNOSIS — F0150 Vascular dementia without behavioral disturbance: Secondary | ICD-10-CM | POA: Diagnosis not present

## 2021-04-03 DIAGNOSIS — Z23 Encounter for immunization: Secondary | ICD-10-CM | POA: Diagnosis not present

## 2021-04-03 DIAGNOSIS — J9611 Chronic respiratory failure with hypoxia: Secondary | ICD-10-CM

## 2021-04-03 DIAGNOSIS — I5033 Acute on chronic diastolic (congestive) heart failure: Secondary | ICD-10-CM

## 2021-04-03 DIAGNOSIS — E039 Hypothyroidism, unspecified: Secondary | ICD-10-CM

## 2021-04-03 DIAGNOSIS — I5032 Chronic diastolic (congestive) heart failure: Secondary | ICD-10-CM | POA: Diagnosis not present

## 2021-04-03 DIAGNOSIS — I482 Chronic atrial fibrillation, unspecified: Secondary | ICD-10-CM

## 2021-04-03 DIAGNOSIS — D631 Anemia in chronic kidney disease: Secondary | ICD-10-CM

## 2021-04-03 DIAGNOSIS — E559 Vitamin D deficiency, unspecified: Secondary | ICD-10-CM | POA: Insufficient documentation

## 2021-04-03 DIAGNOSIS — E782 Mixed hyperlipidemia: Secondary | ICD-10-CM

## 2021-04-03 DIAGNOSIS — I129 Hypertensive chronic kidney disease with stage 1 through stage 4 chronic kidney disease, or unspecified chronic kidney disease: Secondary | ICD-10-CM

## 2021-04-03 MED ORDER — DONEPEZIL HCL 10 MG PO TABS: 10.0000 mg | ORAL_TABLET | Freq: Every day | ORAL | 1 refills | Status: DC

## 2021-04-03 NOTE — Progress Notes (Signed)
Subjective:  Patient ID: Sherry Hess, female    DOB: 04-Dec-1930  Age: 87 y.o. MRN: [id number]  Chief Complaint  Patient presents with   Hyperlipidemia    HPI Patient is an 87 year old white female with history of hypertensive chronic kidney disease, congestive heart failure, atrial fibrillation, vascular dementia, vitamin D deficiency, GERD, depression, chronic respiratory failure with hypoxia, and a nonoperable renal carcinomna.  Hyperlipidemia: Currently on Lipitor 20 mg once daily.  Patient is eating fairly healthy.  Eats 3 meals a day.  Patient also takes fish oil.  Atrial fibrillation currently on carvedilol 25 mg twice daily.  Patient is not on any blood thinners due to high risk for falls.  Thankfully she is had no falls since her last visit.  Hypertensive heart disease with congestive heart failure: Currently on Lasix 20 mg once daily.  Denies issues with swelling.  Hypertension with CKD Stage 4: Blood pressures usually run systolic 737 at home.  Patient is currently on carvedilol 25 mg twice daily, diltiazem 180 mg once daily, hydralazine 50 mg twice daily.  Vitamin D deficiency: Level was high. Decreased vitamin D 6 months ago.   Left renal carcinoma: pt is not a surgical candidate nor is she a candidate for chemo or immunotherapy. Pt is DNR. She will follow up in 1 month with Dr. Hinton Rao.    Current Outpatient Medications on File Prior to Visit  Medication Sig Dispense Refill   atorvastatin (LIPITOR) 20 MG tablet TAKE 1 TABLET BY MOUTH DAILY 90 tablet 1   Calcium Citrate (CAL-CITRATE PO) Take 600 mg by mouth daily.     carvedilol (COREG) 25 MG tablet TAKE 1 TABLET BY MOUTH 2 TIMES DAILY WITH A MEAL 180 tablet 1   diltiazem (CARDIZEM CD) 180 MG 24 hr capsule TAKE 1 CAPSULE BY MOUTH DAILY 90 capsule 0   Ferrous Sulfate (CVS SLOW RELEASE IRON PO) Take 325 mg by mouth daily.     Fish Oil-Cholecalciferol (FISH OIL + D3) 1000-1000 MG-UNIT CAPS Take 1 capsule by mouth  daily.     FLUoxetine (PROZAC) 10 MG capsule Take 1 capsule (10 mg total) by mouth daily. 30 capsule 1   furosemide (LASIX) 20 MG tablet Take 1 tablet (20 mg total) by mouth daily. 90 tablet 3   hydrALAZINE (APRESOLINE) 50 MG tablet Take 1 tablet (50 mg total) by mouth 2 (two) times daily. 180 tablet 1   levothyroxine (SYNTHROID) 25 MCG tablet TAKE 1 TABLET BY MOUTH ONCE DAILY ON AN EMPTY STOMACH 30 MINUTES BEFORE BREAKFAST 90 tablet 3   memantine (NAMENDA) 10 MG tablet Take 1 tablet (10 mg total) by mouth 2 (two) times daily. 180 tablet 1   Multiple Vitamin (MULTIVITAMIN) tablet Take 1 tablet by mouth daily. Unknown strength     omeprazole (PRILOSEC) 20 MG capsule TAKE 1 CAPSULE BY MOUTH DAILY 90 capsule 3   potassium chloride (KLOR-CON) 10 MEQ tablet Take 1 tablet (10 mEq total) by mouth 2 (two) times daily. 60 tablet 5   sodium chloride 1 g tablet Take 1 g by mouth daily.     Vitamin D, Ergocalciferol, (DRISDOL) 1.25 MG (50000 UNIT) CAPS capsule Take 1 capsule (50,000 Units total) by mouth 2 (two) times a week. 24 capsule 1   No current facility-administered medications on file prior to visit.   Past Medical History:  Diagnosis Date   Anxiety    Benign neoplasm of rectum    Benign neoplasm of sigmoid colon    Chest  pain in adult 12/27/2015   CKD (chronic kidney disease) stage 4, GFR 15-29 ml/min ([place]) 07/02/2019   CVA (cerebral vascular accident) ([place]) 03/08/2017   Dehydration 01/14/2021   Dyspepsia 03/09/2017   Dyspepsia 03/09/2017   Essential hypertension 11/20/2014   Fatigue 12/10/2016   High cholesterol    HLD (hyperlipidemia) 03/09/2017   Hx of transient ischemic attack (TIA) 10/18/2016   Hypertension    Hypertension, renal disease, stage 1-4 or unspecified chronic kidney disease 07/02/2019   Mixed hyperlipidemia 03/09/2017   Normocytic anemia    Occult blood positive stool 03/09/2017   Paroxysmal atrial fibrillation ([place]) 02/23/2017   Syncope 12/10/2016   TIA (transient ischemic  attack)    Vascular dementia without behavioral disturbance ([place]) 07/02/2019   Past Surgical History:  Procedure Laterality Date   ABDOMINAL HYSTERECTOMY     COLONOSCOPY N/A 03/11/2017   Procedure: COLONOSCOPY;  Surgeon: Gatha Mayer, MD;  Location: [facility];  Service: Endoscopy;  Laterality: N/A;   ESOPHAGOGASTRODUODENOSCOPY N/A 03/11/2017   Procedure: ESOPHAGOGASTRODUODENOSCOPY (EGD);  Surgeon: Gatha Mayer, MD;  Location: [facility] ENDOSCOPY;  Service: Endoscopy;  Laterality: N/A;   TONSILLECTOMY     VESICOVAGINAL FISTULA CLOSURE W/ TAH      Family History  Problem Relation Age of Onset   Cancer Father        lung   Stroke Mother    Cancer Brother        x2 bladder   Dementia Brother    Atrial fibrillation Son    Ataxia Neg Hx    Chorea Neg Hx    Mental retardation Neg Hx    Migraines Neg Hx    Multiple sclerosis Neg Hx    Neurofibromatosis Neg Hx    Neuropathy Neg Hx    Parkinsonism Neg Hx    Seizures Neg Hx    Social History   Socioeconomic History   Marital status: Widowed    Spouse name: Not on file   Number of children: 3   Years of education: Not on file   Highest education level: Not on file  Occupational History   Not on file  Tobacco Use   Smoking status: Never   Smokeless tobacco: Never  Vaping Use   Vaping Use: Never used  Substance and Sexual Activity   Alcohol use: No   Drug use: No   Sexual activity: Not on file  Other Topics Concern   Not on file  Social History Narrative   Not on file   Social Determinants of Health   Financial Resource Strain: Not on file  Food Insecurity: Not on file  Transportation Needs: Not on file  Physical Activity: Not on file  Stress: Not on file  Social Connections: Not on file    Review of Systems  Constitutional:  Negative for chills, fatigue and fever.  HENT:  Negative for congestion, rhinorrhea and sore throat.   Respiratory:  Positive for shortness of breath. Negative for cough.        Wears O2  @ 1L/min. @ home and 2L/min. Portable.   Cardiovascular:  Negative for chest pain.  Gastrointestinal:  Negative for abdominal pain, constipation, diarrhea, nausea and vomiting.  Genitourinary:  Negative for dysuria and urgency.  Musculoskeletal:  Negative for back pain and myalgias.  Neurological:  Negative for dizziness, weakness, light-headedness and headaches.  Psychiatric/Behavioral:  Negative for dysphoric mood. The patient is not nervous/anxious.    Memory is stable.  Objective:  BP (!) 92/54   Pulse 64  Temp (!) 97.1 F (36.2 C)   Resp 18   Ht 5\' 3"  (1.6 m)   Wt 125 lb 9.6 oz (57 kg)   BMI 22.25 kg/m   BP/Weight 04/03/2021 03/20/2021 64/10/8030  Systolic BP 92 122 482  Diastolic BP 54 80 68  Wt. (Lbs) 125.6 127 127.9  BMI 22.25 22.5 22.66    Physical Exam Vitals reviewed.  Constitutional:      Appearance: Normal appearance. [name] normal weight.  Neck:     Vascular: No carotid bruit.  Cardiovascular:     Rate and Rhythm: Normal rate. Rhythm irregular.     Pulses: Normal pulses.     Heart sounds: Normal heart sounds.  Pulmonary:     Effort: Pulmonary effort is normal. No respiratory distress.     Breath sounds: Normal breath sounds.  Abdominal:     Palpations: Abdomen is soft.     Tenderness: There is no abdominal tenderness.  Neurological:     Mental Status: [name] alert.  Psychiatric:        Mood and Affect: Mood normal.        Behavior: Behavior normal.    Diabetic Foot Exam - Simple   No data filed      Lab Results  Component Value Date   WBC 3.4 04/03/2021   HGB 11.5 04/03/2021   HCT 34.3 04/03/2021   PLT 169 04/03/2021   GLUCOSE 105 (H) 04/03/2021   CHOL 81 (L) 04/03/2021   TRIG 47 04/03/2021   HDL 27 (L) 04/03/2021   LDLCALC 42 04/03/2021   ALT 7 04/03/2021   AST 15 04/03/2021   NA 142 04/03/2021   K 4.8 04/03/2021   CL 104 04/03/2021   CREATININE 1.84 (H) 04/03/2021   BUN 31 (H) 04/03/2021   CO2 19 (L) 04/03/2021   TSH 3.650  04/03/2021   INR 1.1 01/26/2017   HGBA1C 5.2 03/09/2017      Assessment & Plan:   Problem List Items Addressed This Visit       Cardiovascular and Mediastinum   Atrial fibrillation ([place])    The current medical regimen is effective;  continue present plan and medications. continue carvedilol and diltiazem.      Chronic diastolic heart failure (HCC)    Continue lasix and carvedilol.         Respiratory   Chronic respiratory failure with hypoxia (HCC)    Continue continuous oxygen        Endocrine   Acquired hypothyroidism    The current medical regimen is effective;  continue present plan and medications. Continue levothyroxine 25 mcg once daily.      Relevant Orders   TSH (Completed)     Nervous and Auditory   Vascular dementia without behavioral disturbance (HCC)    Stable. Increase aricept to 10 mg once at night.  Continue namenda 10 mg bid.       Relevant Medications   donepezil (ARICEPT) 10 MG tablet   Other Relevant Orders   Lipid panel (Completed)     Genitourinary   CKD (chronic kidney disease) stage 4, GFR 15-29 ml/min (HCC) (Chronic)    Stable.      Hypertensive kidney disease with chronic kidney disease stage IV (HCC)    No changes to medicines. Continue carvedilol 25 mg twice daily, diltiazem 180 mg once daily, hydralazine 50 mg twice daily, and lasix 20 mg once daily  Continue to work on eating a healthy diet and exercise.  Labs drawn  today.        Relevant Orders   CBC with Differential/Platelet (Completed)   Comprehensive metabolic panel (Completed)   Renal cancer ([place])    Continue monitoring by Dr. Hinton Rao Poor candidate for surgery, chemotherapy, or immunotherapy.         Other   Anemia due to stage 4 chronic kidney disease ([place]) - Primary (Chronic)    Follow up with Dr. Hinton Rao.      Relevant Orders   CBC with Differential/Platelet (Completed)   Mixed hyperlipidemia    Well controlled.  No changes to medicines. Currently on  Lipitor 20 mg once daily and fish oil. Continue to work on eating a healthy diet and exercise.  Labs drawn today.       Vitamin D deficiency    Check D. Consider change in medicine      Relevant Orders   VITAMIN D 25 Hydroxy (Vit-D Deficiency, Fractures) (Completed)   Other Visit Diagnoses     Need for influenza vaccination       Relevant Orders   Flu Vaccine QUAD High Dose(Fluad) (Completed)     .  Meds ordered this encounter  Medications   donepezil (ARICEPT) 10 MG tablet    Sig: Take 1 tablet (10 mg total) by mouth at bedtime.    Dispense:  90 tablet    Refill:  1    Orders Placed This Encounter  Procedures   Flu Vaccine QUAD High Dose(Fluad)   CBC with Differential/Platelet   Comprehensive metabolic panel   Lipid panel   TSH   VITAMIN D 25 Hydroxy (Vit-D Deficiency, Fractures)    I,Marla Terrill Mohr, CMA, scribed for [name], MD.,have documented all relevant documentation on the behalf of [name], MD,as directed by  [name], MD while in the presence of [name], MD.   Follow-up: Return in about 4 months (around 08/01/2021) for chronic follow up.  An After Visit Summary was printed and given to the patient.  [name], MD [facility] [phone]

## 2021-04-03 NOTE — Patient Instructions (Signed)
Increase aricept to 10 mg once at night.

## 2021-04-04 LAB — COMPREHENSIVE METABOLIC PANEL
ALT: 7 IU/L (ref 0–32)
AST: 15 IU/L (ref 0–40)
Albumin/Globulin Ratio: 1.5 (ref 1.2–2.2)
Albumin: 3.9 g/dL (ref 3.6–4.6)
Alkaline Phosphatase: 73 IU/L (ref 44–121)
BUN/Creatinine Ratio: 17 (ref 12–28)
BUN: 31 mg/dL — ABNORMAL HIGH (ref 8–27)
Bilirubin Total: 0.4 mg/dL (ref 0.0–1.2)
CO2: 19 mmol/L — ABNORMAL LOW (ref 20–29)
Calcium: 9.2 mg/dL (ref 8.7–10.3)
Chloride: 104 mmol/L (ref 96–106)
Creatinine, Ser: 1.84 mg/dL — ABNORMAL HIGH (ref 0.57–1.00)
Globulin, Total: 2.6 g/dL (ref 1.5–4.5)
Glucose: 105 mg/dL — ABNORMAL HIGH (ref 70–99)
Potassium: 4.8 mmol/L (ref 3.5–5.2)
Sodium: 142 mmol/L (ref 134–144)
Total Protein: 6.5 g/dL (ref 6.0–8.5)
eGFR: 26 mL/min/{1.73_m2} — ABNORMAL LOW (ref >59)

## 2021-04-04 LAB — CBC WITH DIFFERENTIAL/PLATELET
Basophils Absolute: 0 10*3/uL (ref 0.0–0.2)
Basos: 1 %
EOS (ABSOLUTE): 0 10*3/uL (ref 0.0–0.4)
Eos: 0 %
Hematocrit: 34.3 % (ref 34.0–46.6)
Hemoglobin: 11.5 g/dL (ref 11.1–15.9)
Immature Grans (Abs): 0 10*3/uL (ref 0.0–0.1)
Immature Granulocytes: 0 %
Lymphocytes Absolute: 0.6 10*3/uL — ABNORMAL LOW (ref 0.7–3.1)
Lymphs: 19 %
MCH: 28.6 pg (ref 26.6–33.0)
MCHC: 33.5 g/dL (ref 31.5–35.7)
MCV: 85 fL (ref 79–97)
Monocytes Absolute: 0.6 10*3/uL (ref 0.1–0.9)
Monocytes: 19 %
Neutrophils Absolute: 2.1 10*3/uL (ref 1.4–7.0)
Neutrophils: 61 %
Platelets: 169 10*3/uL (ref 150–450)
RBC: 4.02 x10E6/uL (ref 3.77–5.28)
RDW: 15.4 % (ref 11.7–15.4)
WBC: 3.4 10*3/uL (ref 3.4–10.8)

## 2021-04-04 LAB — LIPID PANEL
Chol/HDL Ratio: 3 ratio (ref 0.0–4.4)
Cholesterol, Total: 81 mg/dL — ABNORMAL LOW (ref 100–199)
HDL: 27 mg/dL — ABNORMAL LOW (ref >39)
LDL Chol Calc (NIH): 42 mg/dL (ref 0–99)
Triglycerides: 47 mg/dL (ref 0–149)
VLDL Cholesterol Cal: 12 mg/dL (ref 5–40)

## 2021-04-04 LAB — VITAMIN D 25 HYDROXY (VIT D DEFICIENCY, FRACTURES): Vit D, 25-Hydroxy: 82 ng/mL (ref 30.0–100.0)

## 2021-04-04 LAB — TSH: TSH: 3.65 u[IU]/mL (ref 0.450–4.500)

## 2021-04-04 NOTE — Assessment & Plan Note (Addendum)
The current medical regimen is effective;  continue present plan and medications. Continue levothyroxine 25 mcg once daily.

## 2021-04-04 NOTE — Assessment & Plan Note (Addendum)
Stable. Increase aricept to 10 mg once at night.  Continue namenda 10 mg bid.

## 2021-04-04 NOTE — Assessment & Plan Note (Addendum)
Check D. Consider change in medicine

## 2021-04-04 NOTE — Assessment & Plan Note (Addendum)
Follow up with Dr. Hinton Rao.

## 2021-04-04 NOTE — Assessment & Plan Note (Addendum)
The current medical regimen is effective;  continue present plan and medications. continue carvedilol and diltiazem.

## 2021-04-04 NOTE — Assessment & Plan Note (Addendum)
No changes to medicines. Continue carvedilol 25 mg twice daily, diltiazem 180 mg once daily, hydralazine 50 mg twice daily, and lasix 20 mg once daily  Continue to work on eating a healthy diet and exercise.  Labs drawn today.

## 2021-04-04 NOTE — Assessment & Plan Note (Deleted)
The current medical regimen is effective;  continue present plan and medications.  

## 2021-04-04 NOTE — Assessment & Plan Note (Addendum)
Well controlled.  No changes to medicines. Currently on Lipitor 20 mg once daily and fish oil. Continue to work on eating a healthy diet and exercise.  Labs drawn today.

## 2021-04-05 ENCOUNTER — Encounter: Payer: Self-pay | Admitting: Family Medicine

## 2021-04-05 NOTE — Assessment & Plan Note (Signed)
Stable

## 2021-04-05 NOTE — Assessment & Plan Note (Signed)
Continue lasix and carvedilol.

## 2021-04-05 NOTE — Assessment & Plan Note (Signed)
Continue monitoring by Dr. Hinton Rao Poor candidate for surgery, chemotherapy, or immunotherapy.

## 2021-04-05 NOTE — Assessment & Plan Note (Signed)
Continue continuous oxygen

## 2021-04-06 DIAGNOSIS — H524 Presbyopia: Secondary | ICD-10-CM | POA: Diagnosis not present

## 2021-04-20 ENCOUNTER — Other Ambulatory Visit: Payer: Self-pay | Admitting: Cardiology

## 2021-04-20 ENCOUNTER — Other Ambulatory Visit: Payer: Self-pay | Admitting: Physician Assistant

## 2021-04-21 NOTE — Telephone Encounter (Signed)
Diltiazem 180 mg # 90 x 3 refills sent to  [place], [place]

## 2021-04-22 ENCOUNTER — Encounter: Payer: Self-pay | Admitting: Internal Medicine

## 2021-04-23 DIAGNOSIS — J449 Chronic obstructive pulmonary disease, unspecified: Secondary | ICD-10-CM | POA: Diagnosis not present

## 2021-05-24 DIAGNOSIS — J449 Chronic obstructive pulmonary disease, unspecified: Secondary | ICD-10-CM | POA: Diagnosis not present

## 2021-05-25 NOTE — Progress Notes (Signed)
Sherry Hess  [address] [place],  [place]  [address] [phone]  Clinic Day:  05/28/2021  Referring physician: [name], MD  This document serves as a record of services personally performed by Hosie Poisson, MD. It was created on their behalf by Curry,Lauren E, a trained medical scribe. The creation of this record is based on the scribe's personal observations and the provider's statements to them.  CHIEF COMPLAINT:  CC: Left renal mass  Current Treatment:  Observation   HISTORY OF PRESENT ILLNESS:  Sherry Hess is a 88 y.o. female referred by Dr. Joie Bimler for the evaluation and treatment of newly diagnosed left renal carcinoma.  This began when the patient presented to the emergency department in June due to weakness, acute renal failure and dehydration.  Initial BUN was 76 with a creatinine of 3.1.  CT chest, abdomen and pelvis revealed a large centrally necrotic mass lesion which appeared to arise from the upper pole of the left kidney, measuring approximately 16.9 x 12.5 cm in greatest AP and transverse dimensions and extends for approximately 24 cm in craniocaudad projection, consistent with renal cell carcinoma till proven otherwise.  She was administered IV fluids with improvement in her BUN to 25 and creatinine to 1.3 at the time of discharge.  She was also administered IV iron and she continues oral supplement.  She was referred to Dr. Nila Nephew for outpatient evaluation, and she was deemed not to be a surgical candidate due to her multiple comorbidities.  She has a medical history significant for degenerative disc disease, anemia, dementia, TIA and CVA, hypertension, congestive heart failure and syncope.  INTERVAL HISTORY:  Sherry Hess is here for routine follow up and states that she is doing fairly well and denies complaints. She denies abdominal pain or hematuria. White count has mildly decreased from 3.0 to 2.7 with an ANC of 1590,  hemoglobin is stable at 11.5 and platelets are normal. Chemistries are unremarkable except for a BUN of 33, and a creatinine of 1.9, previously 1.8. Her  appetite is fair, and she has lost 4 pounds since her last visit.  She denies fever, chills or other signs of infection.  She denies nausea, vomiting, bowel issues, or abdominal pain.  She denies sore throat, cough, dyspnea, or chest pain.  REVIEW OF SYSTEMS:  Review of Systems  Constitutional: Negative.  Negative for appetite change, chills, fatigue, fever and unexpected weight change.  HENT:  Negative.    Eyes: Negative.   Respiratory:  Negative for chest tightness, cough, hemoptysis, shortness of breath and wheezing.   Cardiovascular: Negative.  Negative for chest pain, leg swelling and palpitations.  Gastrointestinal: Negative.  Negative for abdominal distention, abdominal pain, blood in stool, constipation, diarrhea, nausea and vomiting.  Endocrine: Negative.   Genitourinary: Negative.  Negative for difficulty urinating, dysuria, frequency and hematuria.   Musculoskeletal: Negative.  Negative for arthralgias, back pain, flank pain, gait problem and myalgias.  Skin: Negative.   Neurological: Negative.  Negative for dizziness, extremity weakness, gait problem, headaches, light-headedness, numbness, seizures and speech difficulty.  Hematological: Negative.   Psychiatric/Behavioral: Negative.  Negative for depression and sleep disturbance. The patient is not nervous/anxious.     VITALS:  Blood pressure (!) 159/71, pulse (!) 59, temperature 98.4 F (36.9 C), temperature source Oral, resp. rate 20, height 5\' 3"  (1.6 m), weight 123 lb 8 oz (56 kg), SpO2 90 %.  Wt Readings from Last 3 Encounters:  05/28/21 123 lb 8 oz (  56 kg)  04/03/21 125 lb 9.6 oz (57 kg)  03/20/21 127 lb (57.6 kg)    Body mass index is 21.88 kg/m.  Performance status (ECOG): 0 - Asymptomatic  PHYSICAL EXAM:  Physical Exam Constitutional:      General: [name] not in  acute distress.    Appearance: Normal appearance. [name] normal weight.  HENT:     Head: Normocephalic and atraumatic.  Eyes:     General: No scleral icterus.    Extraocular Movements: Extraocular movements intact.     Conjunctiva/sclera: Conjunctivae normal.     Pupils: Pupils are equal, round, and reactive to light.  Cardiovascular:     Rate and Rhythm: Regular rhythm. Bradycardia present.     Pulses: Normal pulses.     Heart sounds: Normal heart sounds. No murmur heard.   No friction rub. No gallop.  Pulmonary:     Effort: Pulmonary effort is normal. No respiratory distress.  Abdominal:     General: Bowel sounds are normal. There is no distension.     Palpations: Abdomen is soft. There is mass (about 12-13 cm in height and now crossing the midline to the right side of the abdomen). There is no hepatomegaly or splenomegaly.     Tenderness: There is no abdominal tenderness.  Musculoskeletal:        General: Normal range of motion.     Cervical back: Normal range of motion and neck supple.     Right lower leg: No edema.     Left lower leg: No edema.  Lymphadenopathy:     Cervical: No cervical adenopathy.  Skin:    General: Skin is warm and dry.  Neurological:     General: No focal deficit present.     Mental Status: [name] alert and oriented to person, place, and time. Mental status is at baseline.  Psychiatric:        Mood and Affect: Mood normal.        Behavior: Behavior normal.        Thought Content: Thought content normal.        Judgment: Judgment normal.    LABS:   CBC Latest Ref Rng & Units 05/28/2021 04/03/2021 03/19/2021  WBC - 2.7 3.4 3.0  Hemoglobin 12.0 - 16.0 11.5(A) 11.5 11.5(A)  Hematocrit 36 - 46 36 34.3 36  Platelets 150 - 399 162 169 155   CMP Latest Ref Rng & Units 05/28/2021 04/03/2021 03/19/2021  Glucose 70 - 99 mg/dL - 105(H) -  BUN 4 - 21 33(A) 31(H) 32(A)  Creatinine 0.5 - 1.1 1.9(A) 1.84(H) 1.8(A)  Sodium 137 - 147 138 142 137  Potassium 3.4  - 5.3 4.7 4.8 4.7  Chloride 99 - 108 104 104 101  CO2 13 - 22 26(A) 19(L) 28(A)  Calcium 8.7 - 10.7 8.9 9.2 9.1  Total Protein 6.0 - 8.5 g/dL - 6.5 -  Total Bilirubin 0.0 - 1.2 mg/dL - 0.4 -  Alkaline Phos 25 - 125 64 73 64  AST 13 - 35 21 15 32  ALT 7 - 35 15 7 10      Lab Results  Component Value Date   CEA1 2.4 03/10/2017   /  CEA  Date Value Ref Range Status  03/10/2017 2.4 0.0 - 4.7 ng/mL Final    Comment:    (NOTE)       Roche ECLIA methodology       Nonsmokers  <3.9  Smokers     <5.6 Performed At: [facility] [place], [place] [id number] Lindon Romp MD OV:[id number]    Lab Results  Component Value Date   TIBC 301 03/10/2017   FERRITIN 132 03/10/2017   IRONPCTSAT 7 (L) 03/10/2017   Lab Results  Component Value Date   LDH 106 01/14/2021    STUDIES:  No results found.    HISTORY:   Allergies:  Allergies  Allergen Reactions   Minoxidil     rash   Norvasc [Amlodipine Besylate]     rash   Clonidine Derivatives Rash    [name]    Current Medications: Current Outpatient Medications  Medication Sig Dispense Refill   atorvastatin (LIPITOR) 20 MG tablet TAKE 1 TABLET BY MOUTH DAILY 90 tablet 1   Calcium Citrate (CAL-CITRATE PO) Take 600 mg by mouth daily.     carvedilol (COREG) 25 MG tablet TAKE 1 TABLET BY MOUTH 2 TIMES DAILY WITH A MEAL 180 tablet 1   diltiazem (CARDIZEM CD) 180 MG 24 hr capsule TAKE 1 CAPSULE BY MOUTH DAILY 90 capsule 3   donepezil (ARICEPT) 10 MG tablet Take 1 tablet (10 mg total) by mouth at bedtime. 90 tablet 1   Ferrous Sulfate (CVS SLOW RELEASE IRON PO) Take 325 mg by mouth daily.     Fish Oil-Cholecalciferol (FISH OIL + D3) 1000-1000 MG-UNIT CAPS Take 1 capsule by mouth daily.     FLUoxetine (PROZAC) 10 MG capsule TAKE 1 CAPSULE BY MOUTH ONCE DAILY 30 capsule 1   furosemide (LASIX) 20 MG tablet Take 1 tablet (20 mg total) by mouth daily. 90 tablet 3    hydrALAZINE (APRESOLINE) 50 MG tablet Take 1 tablet (50 mg total) by mouth 2 (two) times daily. 180 tablet 1   levothyroxine (SYNTHROID) 25 MCG tablet TAKE 1 TABLET BY MOUTH ONCE DAILY ON AN EMPTY STOMACH 30 MINUTES BEFORE BREAKFAST 90 tablet 3   memantine (NAMENDA) 10 MG tablet Take 1 tablet (10 mg total) by mouth 2 (two) times daily. 180 tablet 1   Multiple Vitamin (MULTIVITAMIN) tablet Take 1 tablet by mouth daily. Unknown strength     omeprazole (PRILOSEC) 20 MG capsule TAKE 1 CAPSULE BY MOUTH DAILY 90 capsule 3   potassium chloride (KLOR-CON) 10 MEQ tablet Take 1 tablet (10 mEq total) by mouth 2 (two) times daily. 60 tablet 5   sodium chloride 1 g tablet Take 1 g by mouth daily.     Vitamin D, Ergocalciferol, (DRISDOL) 1.25 MG (50000 UNIT) CAPS capsule Take 1 capsule (50,000 Units total) by mouth 2 (two) times a week. 24 capsule 1   No current facility-administered medications for this visit.     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  We have no tissue biopsy.  Unfortunately with her age and multiple comorbidities, including dementia, she is not a candidate for surgical resection. I do not recommend chemotherapy or immunotherapy. She and her son are not interested in aggressive therapy. This is enlarging in width.   Renal insufficiency, which fluctuates up and down.    Hypokalemia.  [name] currently on oral potassium supplement 20 meq daily. As she has trouble with these, I will place her on 10 meq BID.  Anemia, improved with oral iron, which she will continue.  Leukopenia, improved.  We will continue to monitor.  Hypoxia with a oxygen saturation down to 82% in clinic off portable oxygen.  [name] normally on supplemental oxygen 1 L 24 hours per day.    Dementia.  [name] able to  answer questions accordingly but relies on her son as a historian.  8.   Decreased appetite and weight loss.  We could consider an appetite stimulant but Megace would increase her risk for thrombosis and steroids would have many potential toxicities, including edema.  We could consider Remeron but she is already on Prozac and 2 medications for her dementia so we will hold off.  Plan: We will continue with supportive care, and she is doing fairly well at this time.  If needed, we could consider Hospice if she started to decline, and their services have been reviewed. She knows to continue Lasix 20 mg every other day, oral potassium supplement 20 meq daily, and oral iron supplement daily.  I will contact her son with her lab results. Otherwise, we will see her back in 1 month with CBC and CMP for repeat evaluation.  [name] a DNR. She and her son understand and agree with this plan of care.  I have answered their questions and they know to call with any concerns.  I provided 20 minutes of face-to-face time during this this encounter and > 50% was spent counseling as documented under my assessment and plan.    Derwood Kaplan, MD [facility] AT [facility] [address] [place] [address] Dept: [phone] Dept Fax: [phone]   I, Rita Ohara, am acting as scribe for Derwood Kaplan, MD  I have reviewed this report as typed by the medical scribe, and it is complete and accurate.

## 2021-05-28 ENCOUNTER — Inpatient Hospital Stay (INDEPENDENT_AMBULATORY_CARE_PROVIDER_SITE_OTHER): Payer: Medicare Other | Admitting: Oncology

## 2021-05-28 ENCOUNTER — Encounter: Payer: Self-pay | Admitting: Oncology

## 2021-05-28 ENCOUNTER — Telehealth: Payer: Self-pay | Admitting: Oncology

## 2021-05-28 ENCOUNTER — Inpatient Hospital Stay: Payer: Medicare Other | Attending: Oncology

## 2021-05-28 ENCOUNTER — Other Ambulatory Visit: Payer: Self-pay | Admitting: Hematology and Oncology

## 2021-05-28 ENCOUNTER — Telehealth: Payer: Self-pay

## 2021-05-28 ENCOUNTER — Other Ambulatory Visit: Payer: Self-pay | Admitting: Oncology

## 2021-05-28 VITALS — BP 159/71 | HR 59 | Temp 98.4°F | Resp 20 | Ht 63.0 in | Wt 123.5 lb

## 2021-05-28 DIAGNOSIS — C642 Malignant neoplasm of left kidney, except renal pelvis: Secondary | ICD-10-CM

## 2021-05-28 DIAGNOSIS — D649 Anemia, unspecified: Secondary | ICD-10-CM | POA: Diagnosis not present

## 2021-05-28 DIAGNOSIS — N2889 Other specified disorders of kidney and ureter: Secondary | ICD-10-CM

## 2021-05-28 LAB — BASIC METABOLIC PANEL
BUN: 33 — AB (ref 4–21)
CO2: 26 — AB (ref 13–22)
Chloride: 104 (ref 99–108)
Creatinine: 1.9 — AB (ref 0.5–1.1)
Glucose: 97
Potassium: 4.7 (ref 3.4–5.3)
Sodium: 138 (ref 137–147)

## 2021-05-28 LAB — CBC AND DIFFERENTIAL
HCT: 36 (ref 36–46)
Hemoglobin: 11.5 — AB (ref 12.0–16.0)
Neutrophils Absolute: 1.59
Platelets: 162 (ref 150–399)
WBC: 2.7

## 2021-05-28 LAB — HEPATIC FUNCTION PANEL
ALT: 15 (ref 7–35)
AST: 21 (ref 13–35)
Alkaline Phosphatase: 64 (ref 25–125)
Bilirubin, Total: 0.7

## 2021-05-28 LAB — COMPREHENSIVE METABOLIC PANEL
Albumin: 3.7 (ref 3.5–5.0)
Calcium: 8.9 (ref 8.7–10.7)

## 2021-05-28 LAB — CBC: RBC: 4.28 (ref 3.87–5.11)

## 2021-05-28 NOTE — Telephone Encounter (Signed)
Per 1/12 los next appt scheduled and confirmed with patient

## 2021-05-28 NOTE — Chronic Care Management (AMB) (Signed)
Chronic Care Management Pharmacy Assistant   Name: Maddelynn Moosman  MRN: [id number] DOB: [birth year]   Reason for Encounter: Disease State call for HTN and schedule F/U    Recent office visits:  04/03/21 [name] MD. Seen for Anemia. Increase aricept to 10 mg once at night.   Recent consult visits:  03/20/21 (Cardiology) [name] MD. Seen for Paroxysmal Atrial Fibrillation. No med changes.   03/19/21 (Oncology) Hosie Poisson MD. Decreased Potassium Chloride to 10 meq two times daily.   03/19/21 (Oncology) Hosie Poisson MD. Seen for Renal Mass. No med changes.  Hospital visits:  None   Medications: Outpatient Encounter Medications as of 05/28/2021  Medication Sig   atorvastatin (LIPITOR) 20 MG tablet TAKE 1 TABLET BY MOUTH DAILY   Calcium Citrate (CAL-CITRATE PO) Take 600 mg by mouth daily.   carvedilol (COREG) 25 MG tablet TAKE 1 TABLET BY MOUTH 2 TIMES DAILY WITH A MEAL   diltiazem (CARDIZEM CD) 180 MG 24 hr capsule TAKE 1 CAPSULE BY MOUTH DAILY   donepezil (ARICEPT) 10 MG tablet Take 1 tablet (10 mg total) by mouth at bedtime.   Ferrous Sulfate ([employer] SLOW RELEASE IRON PO) Take 325 mg by mouth daily.   Fish Oil-Cholecalciferol (FISH OIL + D3) 1000-1000 MG-UNIT CAPS Take 1 capsule by mouth daily.   FLUoxetine (PROZAC) 10 MG capsule TAKE 1 CAPSULE BY MOUTH ONCE DAILY   furosemide (LASIX) 20 MG tablet Take 1 tablet (20 mg total) by mouth daily.   hydrALAZINE (APRESOLINE) 50 MG tablet Take 1 tablet (50 mg total) by mouth 2 (two) times daily.   levothyroxine (SYNTHROID) 25 MCG tablet TAKE 1 TABLET BY MOUTH ONCE DAILY ON AN EMPTY STOMACH 30 MINUTES BEFORE BREAKFAST   memantine (NAMENDA) 10 MG tablet Take 1 tablet (10 mg total) by mouth 2 (two) times daily.   Multiple Vitamin (MULTIVITAMIN) tablet Take 1 tablet by mouth daily. Unknown strength   omeprazole (PRILOSEC) 20 MG capsule TAKE 1 CAPSULE BY MOUTH DAILY   potassium chloride (KLOR-CON) 10 MEQ tablet Take  1 tablet (10 mEq total) by mouth 2 (two) times daily.   sodium chloride 1 g tablet Take 1 g by mouth daily.   Vitamin D, Ergocalciferol, (DRISDOL) 1.25 MG (50000 UNIT) CAPS capsule Take 1 capsule (50,000 Units total) by mouth 2 (two) times a week.   No facility-administered encounter medications on file as of 05/28/2021.    Recent Office Vitals: BP Readings from Last 3 Encounters:  04/03/21 (!) 92/54  03/20/21 130/80  03/19/21 134/68   Pulse Readings from Last 3 Encounters:  04/03/21 64  03/20/21 66  03/19/21 88    Wt Readings from Last 3 Encounters:  04/03/21 125 lb 9.6 oz (57 kg)  03/20/21 127 lb (57.6 kg)  03/19/21 127 lb 14.4 oz (58 kg)     Kidney Function Lab Results  Component Value Date/Time   CREATININE 1.84 (H) 04/03/2021 10:07 AM   CREATININE 1.8 (A) 03/19/2021 12:00 AM   CREATININE 2.1 (A) 02/02/2021 12:00 AM   CREATININE 1.89 (H) 01/07/2021 02:31 PM   GFRNONAA 23 (L) 07/09/2020 10:04 AM   GFRAA 26 (L) 07/09/2020 10:04 AM    BMP Latest Ref Rng & Units 04/03/2021 03/19/2021 02/02/2021  Glucose 70 - 99 mg/dL 105(H) - -  BUN 8 - 27 mg/dL 31(H) 32(A) 28(A)  Creatinine 0.57 - 1.00 mg/dL 1.84(H) 1.8(A) 2.1(A)  BUN/Creat Ratio 12 - 28 17 - -  Sodium 134 - 144 mmol/L 142 137  136(A)  Potassium 3.5 - 5.2 mmol/L 4.8 4.7 4.0  Chloride 96 - 106 mmol/L 104 101 100  CO2 20 - 29 mmol/L 19(L) 28(A) 28(A)  Calcium 8.7 - 10.3 mg/dL 9.2 9.1 8.5(A)     Current antihypertensive regimen:  Carvedilol 25 mg two times daily  Furosemide 20 mg daily   Patient verbally confirms she is taking the above medications as directed. Yes  How often are you checking your Blood Pressure? daily  she checks her blood pressure in the afternoon after taking her medication.  Current home BP readings: Pt son stated they normally check her BP daily and they are always good but he was at work and did not have the list. I called the pt and she could not find the readings. Pt has been schedule for a  f/u on 06/17/21 @ 12:00pm  Wrist or arm cuff: Arm  Caffeine intake: None  Salt intake:Limited  OTC medications including pseudoephedrine or NSAIDs?  Any readings above 180/120? No  What recent interventions/DTPs have been made by any provider to improve Blood Pressure control since last CPP Visit:  Pt son stated there has not been any changes   Any recent hospitalizations or ED visits since last visit with CPP? No  What diet changes have been made to improve Blood Pressure Control?  No diet changes   What exercise is being done to improve your Blood Pressure Control?  Pt stated she is not exercising right now   Adherence Review: Is the patient currently on ACE/ARB medication? Yes Does the patient have >5 day gap between last estimated fill dates? CPP to review  Care Gaps: Last annual wellness visit? None noted   Star Rating Drugs:  Medication:  Last Fill: Day Supply None noted   Elray Mcgregor, [place] Pharmacist Assistant  [phone]

## 2021-06-01 ENCOUNTER — Telehealth: Payer: Self-pay

## 2021-06-01 ENCOUNTER — Encounter: Payer: Self-pay | Admitting: Hematology and Oncology

## 2021-06-01 NOTE — Telephone Encounter (Signed)
Son [place]  notified.

## 2021-06-01 NOTE — Telephone Encounter (Signed)
-----   Message from Derwood Kaplan, MD sent at 06/01/2021  1:50 PM EST ----- Regarding: call Tell son labs okay except kidneys dehydrated, not much different.  Needs to increase fluids

## 2021-06-11 ENCOUNTER — Other Ambulatory Visit: Payer: Self-pay | Admitting: Physician Assistant

## 2021-06-17 ENCOUNTER — Ambulatory Visit (INDEPENDENT_AMBULATORY_CARE_PROVIDER_SITE_OTHER): Payer: Medicare Other

## 2021-06-17 ENCOUNTER — Other Ambulatory Visit: Payer: Self-pay

## 2021-06-17 DIAGNOSIS — E782 Mixed hyperlipidemia: Secondary | ICD-10-CM

## 2021-06-17 DIAGNOSIS — E039 Hypothyroidism, unspecified: Secondary | ICD-10-CM

## 2021-06-17 DIAGNOSIS — I482 Chronic atrial fibrillation, unspecified: Secondary | ICD-10-CM

## 2021-06-17 NOTE — Patient Instructions (Signed)
Visit Information   Goals Addressed   None    Patient Care Plan: CCM Pharmacy Care Plan     Problem Identified: Dementia, Lipids, AFib   Priority: High  Onset Date: 06/17/2021     Long-Range Goal: Disease State Management   Start Date: 06/17/2021  Expected End Date: 06/17/2022  This Visit's Progress: On track  Priority: High  Note:   Current Barriers:  Does not contact provider office for questions/concerns  Pharmacist Clinical Goal(s):  Patient will achieve adherence to monitoring guidelines and medication adherence to achieve therapeutic efficacy through collaboration with PharmD and provider.   Interventions: 1:1 collaboration with Cox, Elnita Maxwell, MD regarding development and update of comprehensive plan of care as evidenced by provider attestation and co-signature Inter-disciplinary care team collaboration (see longitudinal plan of care) Comprehensive medication review performed; medication list updated in electronic medical record  Hypertension (BP goal <140/90) BP Readings from Last 3 Encounters:  05/28/21 (!) 159/71  04/03/21 (!) 92/54  03/20/21 130/80  -Controlled -Current treatment: Carvedilol 25mg  Appropriate, Effective, Safe, Accessible Diltiazem 180mg  Appropriate, Effective, Safe, Accessible Hydralazine 50mg  Appropriate, Effective, Safe, Accessible -Medications previously tried: N/A  -Current home readings:   Jan 2023: 111/53 -Current dietary habits: "Tries to eat healthy" -Current exercise habits: None -Denies hypotensive/hypertensive symptoms -Educated on BP goals and benefits of medications for prevention of heart attack, stroke and kidney damage; -Counseled to monitor BP at home Daily, document, and provide log at future appointments -Recommended to continue current medication  Hyperlipidemia: (LDL goal < 100) The ASCVD Risk score (Arnett DK, et al., 2019) failed to calculate for the following reasons:   The 2019 ASCVD risk score is only valid for ages 6  to 62   The patient has a prior MI or stroke diagnosis Lab Results  Component Value Date   CHOL 81 (L) 04/03/2021   CHOL 99 (L) 09/30/2020   CHOL 108 04/02/2020   Lab Results  Component Value Date   HDL 27 (L) 04/03/2021   HDL 33 (L) 09/30/2020   HDL 30 (L) 04/02/2020   Lab Results  Component Value Date   LDLCALC 42 04/03/2021   LDLCALC 52 09/30/2020   LDLCALC 61 04/02/2020   Lab Results  Component Value Date   TRIG 47 04/03/2021   TRIG 63 09/30/2020   TRIG 87 04/02/2020   Lab Results  Component Value Date   CHOLHDL 3.0 04/03/2021   CHOLHDL 3.0 09/30/2020   CHOLHDL 3.6 04/02/2020  No results found for: LDLDIRECT -Controlled -Current treatment: Atorvastatin 20mg  Appropriate, Effective, Safe, Accessible Fish Oil Query Appropriate, Query effective, Query Safe, Accessible -Medications previously tried: N/A  -Current dietary patterns: "Tries to eat healthy" -Current exercise habits: None -Educated on Cholesterol goals;  -Recommended to continue current medication  Atrial Fibrillation (Goal: prevent stroke and major bleeding) -Controlled -CHADSVASC: 4 -Current treatment: Rate control:  Carvedilol 25mg  Appropriate, Effective, Safe, Accessible Diltiazem 180mg  Appropriate, Effective, Safe, Accessible Hydralazine 50mg  Appropriate, Effective, Safe, Accessible Anticoagulation:  None (High Fall Risk) -Medications previously tried: Eliquis  -Home BP and HR readings: (See above)  -Counseled on importance of regular laboratory monitoring; -Recommended to continue current medication    Thyroid (Goal TSH: 0.4-5.0) Lab Results  Component Value Date   TSH 3.650 04/03/2021  -Controlled -Current treatment: Levothyroxine 68mcg -Counseled to take medication on an empty stomach -Recommended to continue current medication   Patient Goals/Self-Care Activities Patient will:  - take medications as prescribed as evidenced by patient report and record review  Follow Up  Plan: The patient has been provided with contact information for the care management team and has been advised to call with any health related questions or concerns.   CPP F/U June 2023      Ms. Diego was given information about Chronic Care Management services today including:  CCM service includes personalized support from designated clinical staff supervised by her physician, including individualized plan of care and coordination with other care providers 24/7 contact phone numbers for assistance for urgent and routine care needs. Standard insurance, coinsurance, copays and deductibles apply for chronic care management only during months in which we provide at least 20 minutes of these services. Most insurances cover these services at 100%, however patients may be responsible for any copay, coinsurance and/or deductible if applicable. This service may help you avoid the need for more expensive face-to-face services. Only one practitioner may furnish and bill the service in a calendar month. The patient may stop CCM services at any time (effective at the end of the month) by phone call to the office staff.  Patient agreed to services and verbal consent obtained.   The patient verbalized understanding of instructions, educational materials, and care plan provided today and declined offer to receive copy of patient instructions, educational materials, and care plan.  The pharmacy team will reach out to the patient again over the next 30 days.   Lane Hacker, [place]

## 2021-06-17 NOTE — Progress Notes (Signed)
Chronic Care Management Pharmacy Note  06/17/2021 Name:  Sherry Hess MRN:  [id number] DOB:  Oct 02, 1930  Summary: -Pleasant 88 year old female presents for f/u CCM visit. She stays at home all day and has the TV on. She told me she likes to watch Talk Shows but she doesn't actually watch them, she just likes the noise and having people talking in the house. She lives with her son. Her other son, [name], helps take care of meds and such. He retired at 89 from a Management consultant at [employer]. Now enjoys spending time with his dogs, going on a hike with friends 1/week, and will also go to the zoo weekly for a walk. 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  Recommendations/Changes made from today's visit: -Patient has Afib and is on Fish Oil, her cholesterol looks fine. Will ask PCP about Dc'ing Fish Oil due to Afib interactions  Subjective: Sherry Hess is an 88 y.o. year old female who is a primary patient of Cox, Kirsten, MD.  The CCM team was consulted for assistance with disease management and care coordination needs.    Engaged with patient by telephone for follow up visit in response to provider referral for pharmacy case management and/or care coordination services.   Consent to Services:  The patient was given the following information about Chronic Care Management services today, agreed to services, and gave verbal consent: 1. CCM service includes personalized support from designated clinical staff supervised by the primary care provider, including individualized plan of care and coordination with other care providers 2. 24/7 contact phone numbers for assistance for urgent and routine care needs. 3. Service will only be billed when office clinical staff spend 20 minutes or more in a month to coordinate care. 4. Only one practitioner may furnish and bill the service in a calendar month. 5.The patient may stop CCM services at any time (effective at the end of the month) by  phone call to the office staff. 6. The patient will be responsible for cost sharing (co-pay) of up to 20% of the service fee (after annual deductible is met). Patient agreed to services and consent obtained.  Patient Care Team: [name], MD as PCP - General (Family Medicine) Park Liter, MD as Consulting Physician (Cardiology) Madelon Lips, MD as Consulting Physician (Nephrology) Derwood Kaplan, MD as Consulting Physician (Oncology) Lane Hacker, [facility] (Pharmacist)  Recent office visits:  04/03/21 [name] MD. Seen for Anemia. Increase aricept to 10 mg once at night.    Recent consult visits:  03/20/21 (Cardiology) [name] MD. Seen for Paroxysmal Atrial Fibrillation. No med changes.    03/19/21 (Oncology) Hosie Poisson MD. Decreased Potassium Chloride to 10 meq two times daily.    03/19/21 (Oncology) Hosie Poisson MD. Seen for Renal Mass. No med changes.   Hospital visits:  None    Objective:  Lab Results  Component Value Date   CREATININE 1.9 (A) 05/28/2021   BUN 33 (A) 05/28/2021   EGFR 26 (L) 04/03/2021   GFRNONAA 23 (L) 07/09/2020   GFRAA 26 (L) 07/09/2020   NA 138 05/28/2021   K 4.7 05/28/2021   CALCIUM 8.9 05/28/2021   CO2 26 (A) 05/28/2021   GLUCOSE 105 (H) 04/03/2021    Lab Results  Component Value Date/Time   HGBA1C 5.2 03/09/2017 02:26 AM    Last diabetic Eye exam: No results found for: HMDIABEYEEXA  Last diabetic Foot exam: No results found for: HMDIABFOOTEX   Lab  Results  Component Value Date   CHOL 81 (L) 04/03/2021   HDL 27 (L) 04/03/2021   LDLCALC 42 04/03/2021   TRIG 47 04/03/2021   CHOLHDL 3.0 04/03/2021    Hepatic Function Latest Ref Rng & Units 05/28/2021 04/03/2021 03/19/2021  Total Protein 6.0 - 8.5 g/dL - 6.5 -  Albumin 3.5 - 5.0 3.7 3.9 3.8  AST 13 - 35 21 15 32  ALT 7 - 35 '15 7 10  ' Alk Phosphatase 25 - 125 64 73 64  Total Bilirubin 0.0 - 1.2 mg/dL - 0.4 -    Lab Results  Component  Value Date/Time   TSH 3.650 04/03/2021 10:07 AM   TSH 3.900 09/30/2020 11:09 AM    CBC Latest Ref Rng & Units 05/28/2021 04/03/2021 03/19/2021  WBC - 2.7 3.4 3.0  Hemoglobin 12.0 - 16.0 11.5(A) 11.5 11.5(A)  Hematocrit 36 - 46 36 34.3 36  Platelets 150 - 399 162 169 155    Lab Results  Component Value Date/Time   VD25OH 82.0 04/03/2021 10:07 AM   VD25OH 154.0 (H) 09/30/2020 11:09 AM    Clinical ASCVD: No  The ASCVD Risk score (Arnett DK, et al., 2019) failed to calculate for the following reasons:   The 2019 ASCVD risk score is only valid for ages 78 to 63   The patient has a prior MI or stroke diagnosis    Depression screen [facility] 2/9 09/30/2020 04/02/2020 03/11/2020  Decreased Interest 0 0 0  Down, Depressed, Hopeless 0 0 0  PHQ - 2 Score 0 0 0     Other: (CHADS2VASc if Afib, MMRC or CAT for COPD, ACT, DEXA)  Social History   Tobacco Use  Smoking Status Never  Smokeless Tobacco Never   BP Readings from Last 3 Encounters:  05/28/21 (!) 159/71  04/03/21 (!) 92/54  03/20/21 130/80   Pulse Readings from Last 3 Encounters:  05/28/21 (!) 59  04/03/21 64  03/20/21 66   Wt Readings from Last 3 Encounters:  05/28/21 123 lb 8 oz (56 kg)  04/03/21 125 lb 9.6 oz (57 kg)  03/20/21 127 lb (57.6 kg)   BMI Readings from Last 3 Encounters:  05/28/21 21.88 kg/m  04/03/21 22.25 kg/m  03/20/21 22.50 kg/m    Assessment/Interventions: Review of patient past medical history, allergies, medications, health status, including review of consultants reports, laboratory and other test data, was performed as part of comprehensive evaluation and provision of chronic care management services.   SDOH:  (Social Determinants of Health) assessments and interventions performed: Yes SDOH Interventions    Flowsheet Row Most Recent Value  SDOH Interventions   Financial Strain Interventions Intervention Not Indicated  Physical Activity Interventions Patient Refused      SDOH Screenings    Alcohol Screen: Low Risk    Last Alcohol Screening Score (AUDIT): 0  Depression (PHQ2-9): Low Risk    PHQ-2 Score: 0  Financial Resource Strain: Low Risk    Difficulty of Paying Living Expenses: Not hard at all  Food Insecurity: Not on file  Housing: Not on file  Physical Activity: Inactive   Days of Exercise per Week: 0 days   Minutes of Exercise per Session: 0 min  Social Connections: Not on file  Stress: Not on file  Tobacco Use: Low Risk    Smoking Tobacco Use: Never   Smokeless Tobacco Use: Never   Passive Exposure: Not on file  Transportation Needs: Not on file    CCM Care Plan  Allergies  Allergen  Reactions   Minoxidil     rash   Norvasc [Amlodipine Besylate]     rash   Clonidine Derivatives Rash    [name]    Medications Reviewed Today     Reviewed by Belva Chimes, LPN (Licensed Practical Nurse) on 05/28/21 at 62  Med List Status: <None>   Medication Order Taking? Sig Documenting Provider Last Dose Status Informant  atorvastatin (LIPITOR) 20 MG tablet [id number] No TAKE 1 TABLET BY MOUTH DAILY Cox, Kirsten, MD Taking Expired 04/18/21 2359   Calcium Citrate (CAL-CITRATE PO) [id number] No Take 600 mg by mouth daily. [provider] Taking Active   carvedilol (COREG) 25 MG tablet [id number] No TAKE 1 TABLET BY MOUTH 2 TIMES DAILY WITH A MEAL Cox, Kirsten, MD Taking Active   diltiazem (CARDIZEM CD) 180 MG 24 hr capsule [id number]  TAKE 1 CAPSULE BY MOUTH DAILY Park Liter, MD  Active   donepezil (ARICEPT) 10 MG tablet [id number]  Take 1 tablet (10 mg total) by mouth at bedtime. Cox, Kirsten, MD  Active   Ferrous Sulfate ([employer] SLOW RELEASE IRON PO) [id number] No Take 325 mg by mouth daily. [provider] Taking Active   Fish Oil-Cholecalciferol (FISH OIL + D3) 1000-1000 MG-UNIT CAPS [id number] No Take 1 capsule by mouth daily. [provider] Taking Active Family Member  FLUoxetine (PROZAC) 10 MG capsule [id number]  TAKE  1 CAPSULE BY MOUTH ONCE DAILY Marge Duncans, PA-C  Active   furosemide (LASIX) 20 MG tablet [id number] No Take 1 tablet (20 mg total) by mouth daily. Park Liter, MD Taking Expired 03/20/21 2359   hydrALAZINE (APRESOLINE) 50 MG tablet [id number] No Take 1 tablet (50 mg total) by mouth 2 (two) times daily. Cox, Kirsten, MD Taking Active   levothyroxine (SYNTHROID) 25 MCG tablet [id number] No TAKE 1 TABLET BY MOUTH ONCE DAILY ON AN EMPTY STOMACH 30 MINUTES BEFORE BREAKFAST Cox, Kirsten, MD Taking Active   memantine (NAMENDA) 10 MG tablet [id number] No Take 1 tablet (10 mg total) by mouth 2 (two) times daily. Cox, Kirsten, MD Taking Active   Multiple Vitamin (MULTIVITAMIN) tablet [id number] No Take 1 tablet by mouth daily. Unknown strength [provider] Taking Active   omeprazole (PRILOSEC) 20 MG capsule [id number] No TAKE 1 CAPSULE BY MOUTH DAILY Cox, Kirsten, MD Taking Active   potassium chloride (KLOR-CON) 10 MEQ tablet [id number]  Take 1 tablet (10 mEq total) by mouth 2 (two) times daily. Derwood Kaplan, MD  Active   sodium chloride 1 g tablet [id number] No Take 1 g by mouth daily. [provider] Taking Active Self  Vitamin D, Ergocalciferol, (DRISDOL) 1.25 MG (50000 UNIT) CAPS capsule [id number] No Take 1 capsule (50,000 Units total) by mouth 2 (two) times a week. [name], MD Taking Active             Patient Active Problem List   Diagnosis Date Noted   Vitamin D deficiency 04/03/2021   Acquired hypothyroidism 04/03/2021   Renal cancer ([place]) 03/20/2021   Dehydration 01/14/2021   Neutropenia ([place]) 01/14/2021    Class: Diagnosis of   Anemia due to stage 4 chronic kidney disease ([place]) 11/27/2020   Chronic diastolic heart failure ([place]) 11/27/2020   Chronic respiratory failure with hypoxia ([place]) 11/27/2020   TIA (transient ischemic attack)    Anxiety    Hypertensive kidney disease with chronic kidney disease stage IV ([place]) 07/02/2019   Atrial fibrillation  ([place]) 07/02/2019   CKD (chronic kidney disease) stage  4, GFR 15-29 ml/min (HCC) 07/02/2019   Vascular dementia without behavioral disturbance ([place]) 07/02/2019   Mixed hyperlipidemia 03/09/2017   CVA (cerebral vascular accident) ([place]) 03/08/2017   Paroxysmal atrial fibrillation ([place]) 02/23/2017   Hx of transient ischemic attack (TIA) 10/18/2016    Immunization History  Administered Date(s) Administered   Fluad Quad(high Dose 65+) 03/11/2020, 04/03/2021   Influenza,inj,quad, With Preservative 02/09/2018   Influenza-Unspecified 02/14/2018   PFIZER Comirnaty(Gray Top)Covid-19 Tri-Sucrose Vaccine 09/30/2020   PFIZER(Purple Top)SARS-COV-2 Vaccination 06/02/2019, 06/23/2019, 02/19/2020   Pfizer Covid-19 Vaccine Bivalent Booster 84yr & up 04/03/2021   Pneumococcal Conjugate-13 01/04/2014   Pneumococcal Polysaccharide-23 05/17/2010   Tdap 05/27/2011    Conditions to be addressed/monitored:  Hypertension, Hyperlipidemia, and Atrial Fibrillation  Care Plan : [place] Updates made by KLane Hacker RPH since 06/17/2021 12:00 AM     Problem: Dementia, Lipids, AFib   Priority: High  Onset Date: 06/17/2021     Long-Range Goal: Disease State Management   Start Date: 06/17/2021  Expected End Date: 06/17/2022  This Visit's Progress: On track  Priority: High  Note:   Current Barriers:  Does not contact provider office for questions/concerns  Pharmacist Clinical Goal(s):  Patient will achieve adherence to monitoring guidelines and medication adherence to achieve therapeutic efficacy through collaboration with PharmD and provider.   Interventions: 1:1 collaboration with Cox, KElnita Maxwell MD regarding development and update of comprehensive plan of care as evidenced by provider attestation and co-signature Inter-disciplinary care team collaboration (see longitudinal plan of care) Comprehensive medication review performed; medication list updated in electronic medical  record  Hypertension (BP goal <140/90) BP Readings from Last 3 Encounters:  05/28/21 (!) 159/71  04/03/21 (!) 92/54  03/20/21 130/80  -Controlled -Current treatment: Carvedilol 284mAppropriate, Effective, Safe, Accessible Diltiazem 18058mppropriate, Effective, Safe, Accessible Hydralazine 62m60mpropriate, Effective, Safe, Accessible -Medications previously tried: N/A  -Current home readings:   Jan 2023: 111/53 -Current dietary habits: "Tries to eat healthy" -Current exercise habits: None -Denies hypotensive/hypertensive symptoms -Educated on BP goals and benefits of medications for prevention of heart attack, stroke and kidney damage; -Counseled to monitor BP at home Daily, document, and provide log at future appointments -Recommended to continue current medication  Hyperlipidemia: (LDL goal < 100) The ASCVD Risk score (Arnett DK, et al., 2019) failed to calculate for the following reasons:   The 2019 ASCVD risk score is only valid for ages 40 t4679  59he patient has a prior MI or stroke diagnosis Lab Results  Component Value Date   CHOL 81 (L) 04/03/2021   CHOL 99 (L) 09/30/2020   CHOL 108 04/02/2020   Lab Results  Component Value Date   HDL 27 (L) 04/03/2021   HDL 33 (L) 09/30/2020   HDL 30 (L) 04/02/2020   Lab Results  Component Value Date   LDLCALC 42 04/03/2021   LDLCALC 52 09/30/2020   LDLCALC 61 04/02/2020   Lab Results  Component Value Date   TRIG 47 04/03/2021   TRIG 63 09/30/2020   TRIG 87 04/02/2020   Lab Results  Component Value Date   CHOLHDL 3.0 04/03/2021   CHOLHDL 3.0 09/30/2020   CHOLHDL 3.6 04/02/2020  No results found for: LDLDIRECT -Controlled -Current treatment: Atorvastatin 20mg52mropriate, Effective, Safe, Accessible Fish Oil Query Appropriate, Query effective, Query Safe, Accessible -Medications previously tried: N/A  -Current dietary patterns: "Tries to eat healthy" -Current exercise habits: None -Educated on Cholesterol  goals;  -Recommended to continue current medication  Atrial  Fibrillation (Goal: prevent stroke and major bleeding) -Controlled -CHADSVASC: 4 -Current treatment: Rate control:  Carvedilol 43m Appropriate, Effective, Safe, Accessible Diltiazem 1812mAppropriate, Effective, Safe, Accessible Hydralazine 5074mppropriate, Effective, Safe, Accessible Anticoagulation:  None (High Fall Risk) -Medications previously tried: Eliquis  -Home BP and HR readings: (See above)  -Counseled on importance of regular laboratory monitoring; -Recommended to continue current medication    Thyroid (Goal TSH: 0.4-5.0) Lab Results  Component Value Date   TSH 3.650 04/03/2021  -Controlled -Current treatment: Levothyroxine 64m9mCounseled to take medication on an empty stomach -Recommended to continue current medication   Patient Goals/Self-Care Activities Patient will:  - take medications as prescribed as evidenced by patient report and record review  Follow Up Plan: The patient has been provided with contact information for the care management team and has been advised to call with any health related questions or concerns.   CPP F/U June 2023       Medication Assistance: None required.  Patient affirms current coverage meets needs.  Compliance/Adherence/Medication fill history: Care Gaps: Last annual wellness visit? None noted    Star Rating Drugs:  Medication:                Last Fill:         Day Supply None noted   Patient's preferred pharmacy is:  [facility] [id number]: [phone]: [phone] pill box? Yes Pt endorses 100% compliance  We discussed: Current pharmacy is preferred with insurance plan and patient is satisfied with pharmacy services Patient decided to: Continue current medication management strategy  Care Plan and Follow Up Patient Decision:  Patient agrees to Care Plan  and Follow-up.  Plan: The patient has been provided with contact information for the care management team and has been advised to call with any health related questions or concerns.   CPP F/U June 2023  [place] [address]- [phone]

## 2021-06-23 ENCOUNTER — Ambulatory Visit: Payer: Medicare Other | Admitting: Cardiology

## 2021-06-23 ENCOUNTER — Encounter: Payer: Self-pay | Admitting: Cardiology

## 2021-06-23 ENCOUNTER — Other Ambulatory Visit: Payer: Self-pay

## 2021-06-23 VITALS — BP 120/66 | HR 70 | Ht 64.0 in | Wt 122.8 lb

## 2021-06-23 DIAGNOSIS — I480 Paroxysmal atrial fibrillation: Secondary | ICD-10-CM | POA: Diagnosis not present

## 2021-06-23 DIAGNOSIS — J9611 Chronic respiratory failure with hypoxia: Secondary | ICD-10-CM | POA: Diagnosis not present

## 2021-06-23 DIAGNOSIS — C642 Malignant neoplasm of left kidney, except renal pelvis: Secondary | ICD-10-CM | POA: Diagnosis not present

## 2021-06-23 DIAGNOSIS — I5032 Chronic diastolic (congestive) heart failure: Secondary | ICD-10-CM

## 2021-06-23 NOTE — Patient Instructions (Signed)

## 2021-06-23 NOTE — Progress Notes (Signed)
Cardiology Office Note:    Date:  06/23/2021   ID:  Sherry Hess, DOB 11/07/1930, MRN [id number]  PCP:  [name], MD  Cardiologist:  [name], MD    Referring MD: [name], MD   No chief complaint on file. During f well  History of Present Illness:    Sherry Hess is a 88 y.o. female with past medical history significant for paroxysmal atrial fibrillation, essential hypertension, vascular dementia, dyslipidemia, renal cancer that was recently recognized.  She has been follow-up with me last time I seen her which was in November looked like she was in atrial fibrillation she also got some decompensated congestive heart failure which was managed with diuretics she was given initially diuretics and potassium improved somewhat.  Overall decision has been made because of her comorbidities and advanced age and fragility conservative approach being implemented.  Past Medical History:  Diagnosis Date   Anxiety    Benign neoplasm of rectum    Benign neoplasm of sigmoid colon    Chest pain in adult 12/27/2015   CKD (chronic kidney disease) stage 4, GFR 15-29 ml/min ([place]) 07/02/2019   CVA (cerebral vascular accident) ([place]) 03/08/2017   Dehydration 01/14/2021   Dyspepsia 03/09/2017   Dyspepsia 03/09/2017   Essential hypertension 11/20/2014   Fatigue 12/10/2016   High cholesterol    HLD (hyperlipidemia) 03/09/2017   Hx of transient ischemic attack (TIA) 10/18/2016   Hypertension    Hypertension, renal disease, stage 1-4 or unspecified chronic kidney disease 07/02/2019   Mixed hyperlipidemia 03/09/2017   Normocytic anemia    Occult blood positive stool 03/09/2017   Paroxysmal atrial fibrillation ([place]) 02/23/2017   Syncope 12/10/2016   TIA (transient ischemic attack)    Vascular dementia without behavioral disturbance ([place]) 07/02/2019    Past Surgical History:  Procedure Laterality Date   ABDOMINAL HYSTERECTOMY     COLONOSCOPY N/A 03/11/2017   Procedure: COLONOSCOPY;   Surgeon: Gatha Mayer, MD;  Location: [facility];  Service: Endoscopy;  Laterality: N/A;   ESOPHAGOGASTRODUODENOSCOPY N/A 03/11/2017   Procedure: ESOPHAGOGASTRODUODENOSCOPY (EGD);  Surgeon: Gatha Mayer, MD;  Location[facility] ENDOSCOPY;  Service: Endoscopy;  Laterality: N/A;   TONSILLECTOMY     VESICOVAGINAL FISTULA CLOSURE W/ TAH      Current Medications: No outpatient medications have been marked as taking for the 06/23/21 encounter (Appointment) with Park Liter, MD.     Allergies:   Minoxidil, Norvasc [amlodipine besylate], and Clonidine derivatives   Social History   Socioeconomic History   Marital status: Widowed    Spouse name: Not on file   Number of children: 3   Years of education: Not on file   Highest education level: Not on file  Occupational History   Not on file  Tobacco Use   Smoking status: Never   Smokeless tobacco: Never  Vaping Use   Vaping Use: Never used  Substance and Sexual Activity   Alcohol use: No   Drug use: No   Sexual activity: Not on file  Other Topics Concern   Not on file  Social History Narrative   Not on file   Social Determinants of Health   Financial Resource Strain: Low Risk    Difficulty of Paying Living Expenses: Not hard at all  Food Insecurity: Not on file  Transportation Needs: Not on file  Physical Activity: Inactive   Days of Exercise per Week: 0 days   Minutes of Exercise per Session: 0 min  Stress: Not on  file  Social Connections: Not on file     Family History: [name] family history includes Atrial fibrillation in her son; Cancer in her brother and father; Dementia in her brother; Stroke in her mother. There is no history of Ataxia, Chorea, Mental retardation, Migraines, Multiple sclerosis, Neurofibromatosis, Neuropathy, Parkinsonism, or Seizures. ROS:   Please see the history of present illness.    All 14 point review of systems negative except as described per history of present  illness  EKGs/Labs/Other Studies Reviewed:      Recent Labs: 01/07/2021: NT-Pro BNP 11,561 04/03/2021: TSH 3.650 05/28/2021: ALT 15; BUN 33; Creatinine 1.9; Hemoglobin 11.5; Platelets 162; Potassium 4.7; Sodium 138  Recent Lipid Panel    Component Value Date/Time   CHOL 81 (L) 04/03/2021 1007   TRIG 47 04/03/2021 1007   HDL 27 (L) 04/03/2021 1007   CHOLHDL 3.0 04/03/2021 1007   CHOLHDL 3.3 03/09/2017 0226   VLDL 10 03/09/2017 0226   LDLCALC 42 04/03/2021 1007    Physical Exam:    VS:  There were no vitals taken for this visit.    Wt Readings from Last 3 Encounters:  05/28/21 123 lb 8 oz (56 kg)  04/03/21 125 lb 9.6 oz (57 kg)  03/20/21 127 lb (57.6 kg)     GEN:  Well nourished, well developed in no acute distress HEENT: Normal NECK: No JVD; No carotid bruits LYMPHATICS: No lymphadenopathy CARDIAC: RRR, no murmurs, no rubs, no gallops RESPIRATORY:  Clear to auscultation without rales, wheezing or rhonchi  ABDOMEN: Soft, non-tender, non-distended MUSCULOSKELETAL:  No edema; No deformity  SKIN: Warm and dry LOWER EXTREMITIES: no swelling NEUROLOGIC:  Alert and oriented x 3 PSYCHIATRIC:  Normal affect   ASSESSMENT:    1. Paroxysmal atrial fibrillation (HCC)   2. Chronic diastolic heart failure ([place])   3. Chronic respiratory failure with hypoxia (HCC)   4. Malignant neoplasm of left kidney (HCC)    PLAN:    In order of problems listed above:  Paroxysmal atrial fibrillation look at persistent atrial fibrillation, she is not anticoagulated because of fragility and also presence of renal cancer.  I had a long discussion with her son who is always with her in the room as well as the patient about this approach they favor conservative approach which I present agree with. Chronic diastolic congestive heart failure stable on appropriate medications. Chronic respiratory failure she seems to be doing well Malignant renal cancer because of advanced age decision has been made  conservative treatment.   Medication Adjustments/Labs and Tests Ordered: Current medicines are reviewed at length with the patient today.  Concerns regarding medicines are outlined above.  No orders of the defined types were placed in this encounter.  Medication changes: No orders of the defined types were placed in this encounter.   Signed, Park Liter, MD, [facility] 06/23/2021 3:02 PM    [facility]

## 2021-06-24 DIAGNOSIS — J449 Chronic obstructive pulmonary disease, unspecified: Secondary | ICD-10-CM | POA: Diagnosis not present

## 2021-07-07 ENCOUNTER — Other Ambulatory Visit: Payer: Self-pay | Admitting: Family Medicine

## 2021-07-07 DIAGNOSIS — I129 Hypertensive chronic kidney disease with stage 1 through stage 4 chronic kidney disease, or unspecified chronic kidney disease: Secondary | ICD-10-CM

## 2021-07-10 ENCOUNTER — Other Ambulatory Visit: Payer: Self-pay | Admitting: Family Medicine

## 2021-07-11 ENCOUNTER — Other Ambulatory Visit: Payer: Self-pay | Admitting: Family Medicine

## 2021-07-13 NOTE — Telephone Encounter (Signed)
Refill sent to pharmacy.   

## 2021-07-14 DIAGNOSIS — E782 Mixed hyperlipidemia: Secondary | ICD-10-CM

## 2021-07-14 DIAGNOSIS — I482 Chronic atrial fibrillation, unspecified: Secondary | ICD-10-CM | POA: Diagnosis not present

## 2021-07-14 DIAGNOSIS — E039 Hypothyroidism, unspecified: Secondary | ICD-10-CM

## 2021-07-22 DIAGNOSIS — J449 Chronic obstructive pulmonary disease, unspecified: Secondary | ICD-10-CM | POA: Diagnosis not present

## 2021-07-27 ENCOUNTER — Inpatient Hospital Stay: Payer: Medicare Other

## 2021-07-27 ENCOUNTER — Inpatient Hospital Stay: Payer: Medicare Other | Attending: Oncology | Admitting: Hematology and Oncology

## 2021-07-27 ENCOUNTER — Other Ambulatory Visit: Payer: Self-pay

## 2021-07-27 ENCOUNTER — Encounter: Payer: Self-pay | Admitting: Hematology and Oncology

## 2021-07-27 DIAGNOSIS — D631 Anemia in chronic kidney disease: Secondary | ICD-10-CM

## 2021-07-27 DIAGNOSIS — C642 Malignant neoplasm of left kidney, except renal pelvis: Secondary | ICD-10-CM

## 2021-07-27 DIAGNOSIS — N184 Chronic kidney disease, stage 4 (severe): Secondary | ICD-10-CM

## 2021-07-27 DIAGNOSIS — D61818 Other pancytopenia: Secondary | ICD-10-CM | POA: Insufficient documentation

## 2021-07-27 LAB — BASIC METABOLIC PANEL
BUN: 32 — AB (ref 4–21)
CO2: 26 — AB (ref 13–22)
Chloride: 101 (ref 99–108)
Creatinine: 2.1 — AB (ref 0.5–1.1)
Glucose: 103
Potassium: 4.1 mEq/L (ref 3.5–5.1)
Sodium: 135 — AB (ref 137–147)

## 2021-07-27 LAB — HEPATIC FUNCTION PANEL
ALT: 12 U/L (ref 7–35)
AST: 15 (ref 13–35)
Alkaline Phosphatase: 60 (ref 25–125)
Bilirubin, Total: 0.9

## 2021-07-27 LAB — CBC AND DIFFERENTIAL
HCT: 36 (ref 36–46)
Hemoglobin: 11.5 — AB (ref 12.0–16.0)
Neutrophils Absolute: 1.77
Platelets: 131 10*3/uL — AB (ref 150–400)
WBC: 3.1

## 2021-07-27 LAB — COMPREHENSIVE METABOLIC PANEL
Albumin: 3.5 (ref 3.5–5.0)
Calcium: 8.8 (ref 8.7–10.7)

## 2021-07-27 LAB — CBC: RBC: 4.31 (ref 3.87–5.11)

## 2021-07-27 NOTE — Assessment & Plan Note (Signed)
Renal insufficiency, which fluctuates up and down. Creatinine today in worse at 2.10. Both she and her son know that she needs to push fluids.  ?

## 2021-07-27 NOTE — Assessment & Plan Note (Signed)
Anemia, improved with oral iron, which she will continue. ?

## 2021-07-27 NOTE — Progress Notes (Cosign Needed)
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  We have no tissue biopsy.  Unfortunately with her age and multiple comorbidities, including dementia, she is not a candidate for surgical resection. We do not recommend chemotherapy or immunotherapy. She and her son are not interested in aggressive therapy. This is enlarging in width. [name] doing well and denies any complaints. We will see her back in 4 weeks for repeat evaluation.   CKD (chronic kidney disease) stage 4, GFR 15-29 ml/min (HCC) Renal insufficiency, which fluctuates up and down. Creatinine today in worse at 2.10. Both she and her son know that she needs to push fluids.   Anemia due to stage 4 chronic kidney disease (HCC) Anemia, improved with oral iron, which she will continue.    The patient understands the plans discussed today and is in agreement with them.  She knows to contact our office if she develops concerns prior to her next appointment.     Melodye Ped, NP  [place] [address] [place] [address] Dept: [phone] Dept Fax: [phone]   Orders Placed This Encounter  Procedures   CBC and differential    This external order was created through the Results Console.   CBC    This external order was created through the Results Console.   Basic metabolic panel     This external order was created through the Results Console.   Comprehensive metabolic panel    This external order was created through the Results Console.   Hepatic function panel    This external order was created through the Results Console.      CHIEF COMPLAINT:  CC: An 88 year old female with history of renal carcinoma here for palliative care 4 week evaluation  Current Treatment:  Palliative care  INTERVAL HISTORY:  Sherry Hess is here today for repeat clinical assessment. She denies fevers or chills. She denies pain. Her appetite is good. Her weight has been stable.  I have reviewed the past medical history, past surgical history, social history and family history with the patient and they are unchanged from previous note.  ALLERGIES:  is allergic to minoxidil, norvasc [amlodipine besylate], and clonidine derivatives.  MEDICATIONS:  Current Outpatient Medications  Medication Sig Dispense Refill   atorvastatin (LIPITOR) 20 MG tablet TAKE 1 TABLET BY MOUTH DAILY 90 tablet 0   Calcium Citrate (CAL-CITRATE PO) Take 600 mg by mouth daily.     carvedilol (COREG) 25 MG tablet TAKE 1 TABLET BY MOUTH 2 TIMES DAILY WITH A MEAL 180 tablet 1   diltiazem (CARDIZEM CD) 180 MG 24 hr capsule TAKE 1 CAPSULE BY MOUTH DAILY (Patient taking differently: Take 180 mg by mouth daily.) 90 capsule 3   donepezil (ARICEPT) 10 MG tablet Take 1 tablet (10 mg total) by mouth at bedtime. 90 tablet 1   Ferrous Sulfate ([employer] SLOW RELEASE IRON PO) Take 325 mg by mouth daily.  Fish Oil-Cholecalciferol (FISH OIL + D3) 1000-1000 MG-UNIT CAPS Take 1 capsule by mouth daily.     FLUoxetine (PROZAC) 10 MG capsule TAKE 1 CAPSULE BY MOUTH ONCE DAILY (Patient taking differently: Take 10 mg by mouth daily.) 90 capsule 1   furosemide (LASIX) 20 MG tablet Take 1 tablet (20 mg total) by mouth daily. 90 tablet 3   hydrALAZINE (APRESOLINE) 50 MG tablet TAKE 1 TABLET BY MOUTH 2 TIMES DAILY 180 tablet 1   levothyroxine (SYNTHROID)  25 MCG tablet TAKE 1 TABLET BY MOUTH ONCE DAILY ON AN EMPTY STOMACH 30 MINUTES BEFORE BREAKFAST (Patient taking differently: Take 25 mcg by mouth daily before breakfast.) 90 tablet 3   memantine (NAMENDA) 10 MG tablet TAKE 1 TABLET BY MOUTH 2 TIMES DAILY 180 tablet 1   Multiple Vitamin (MULTIVITAMIN) tablet Take 1 tablet by mouth daily. Unknown strength     omeprazole (PRILOSEC) 20 MG capsule TAKE 1 CAPSULE BY MOUTH DAILY (Patient taking differently: Take 20 mg by mouth daily.) 90 capsule 3   potassium chloride (KLOR-CON) 10 MEQ tablet Take 1 tablet (10 mEq total) by mouth 2 (two) times daily. 60 tablet 5   sodium chloride 1 g tablet Take 1 g by mouth daily.     Vitamin D, Ergocalciferol, (DRISDOL) 1.25 MG (50000 UNIT) CAPS capsule Take 1 capsule (50,000 Units total) by mouth 2 (two) times a week. 24 capsule 1   No current facility-administered medications for this visit.    HISTORY OF PRESENT ILLNESS:   Oncology History   No history exists.      REVIEW OF SYSTEMS:   Constitutional: Denies fevers, chills or abnormal weight loss Eyes: Denies blurriness of vision Ears, nose, mouth, throat, and face: Denies mucositis or sore throat Respiratory: Denies cough, dyspnea or wheezes Cardiovascular: Denies palpitation, chest discomfort or lower extremity swelling Gastrointestinal:  Denies nausea, heartburn or change in bowel habits Skin: Denies abnormal skin rashes Lymphatics: Denies new lymphadenopathy or easy bruising Neurological:Denies numbness, tingling or new weaknesses Behavioral/Psych: Mood is stable, no new changes  All other systems were reviewed with the patient and are negative.   VITALS:  Blood pressure (!) 115/58, pulse 61, temperature 97.7 F (36.5 C), temperature source Oral, resp. rate 18, height '5\' 3"'$  (1.6 m), weight 122 lb 1.6 oz (55.4 kg), SpO2 (!) 88 %.  Wt Readings from Last 3 Encounters:  07/27/21 122 lb 1.6 oz (55.4 kg)  06/23/21 122 lb 12.8 oz (55.7 kg)  05/28/21  123 lb 8 oz (56 kg)    Body mass index is 21.63 kg/m.  Performance status (ECOG): 1 - Symptomatic but completely ambulatory  PHYSICAL EXAM:   GENERAL:alert, no distress and comfortable SKIN: skin color, texture, turgor are normal, no rashes or significant lesions EYES: normal, Conjunctiva are pink and non-injected, sclera clear OROPHARYNX:no exudate, no erythema and lips, buccal mucosa, and tongue normal  NECK: supple, thyroid normal size, non-tender, without nodularity LYMPH:  no palpable lymphadenopathy in the cervical, axillary or inguinal LUNGS: clear to auscultation and percussion with normal breathing effort HEART: regular rate & rhythm and no murmurs and no lower extremity edema ABDOMEN:abdomen soft, non-tender and normal bowel sounds Musculoskeletal:no cyanosis of digits and no clubbing  NEURO: alert & oriented x 3 with fluent speech, no focal motor/sensory deficits  LABORATORY DATA:  I have reviewed the data as listed    Component Value Date/Time   NA 135 (A) 07/27/2021 0000   K 4.1 07/27/2021 0000   CL 101 07/27/2021 0000  CO2 26 (A) 07/27/2021 0000   GLUCOSE 105 (H) 04/03/2021 1007   GLUCOSE 95 03/12/2017 0604   BUN 32 (A) 07/27/2021 0000   CREATININE 2.1 (A) 07/27/2021 0000   CREATININE 1.84 (H) 04/03/2021 1007   CALCIUM 8.8 07/27/2021 0000   PROT 6.5 04/03/2021 1007   ALBUMIN 3.5 07/27/2021 0000   ALBUMIN 3.9 04/03/2021 1007   AST 15 07/27/2021 0000   ALT 12 07/27/2021 0000   ALKPHOS 60 07/27/2021 0000   BILITOT 0.4 04/03/2021 1007   GFRNONAA 23 (L) 07/09/2020 1004   GFRAA 26 (L) 07/09/2020 1004    No results found for: SPEP, UPEP  Lab Results  Component Value Date   WBC 3.1 07/27/2021   NEUTROABS 1.77 07/27/2021   HGB 11.5 (A) 07/27/2021   HCT 36 07/27/2021   MCV 85 04/03/2021   PLT 131 (A) 07/27/2021      Chemistry      Component Value Date/Time   NA 135 (A) 07/27/2021 0000   K 4.1 07/27/2021 0000   CL 101 07/27/2021 0000   CO2 26 (A)  07/27/2021 0000   BUN 32 (A) 07/27/2021 0000   CREATININE 2.1 (A) 07/27/2021 0000   CREATININE 1.84 (H) 04/03/2021 1007   GLU 103 07/27/2021 0000      Component Value Date/Time   CALCIUM 8.8 07/27/2021 0000   ALKPHOS 60 07/27/2021 0000   AST 15 07/27/2021 0000   ALT 12 07/27/2021 0000   BILITOT 0.4 04/03/2021 1007       RADIOGRAPHIC STUDIES: I have personally reviewed the radiological images as listed and agreed with the findings in the report. No results found.

## 2021-07-27 NOTE — Assessment & Plan Note (Signed)
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  We have no tissue biopsy.  Unfortunately with her age and multiple comorbidities, including dementia, she is not a candidate for surgical resection. We do not recommend chemotherapy or immunotherapy. She and her son are not interested in aggressive therapy. This is enlarging in width. [name] doing well and denies any complaints. We will see her back in 4 weeks for repeat evaluation.  ?

## 2021-07-29 ENCOUNTER — Telehealth: Payer: Self-pay

## 2021-07-29 NOTE — Chronic Care Management (AMB) (Signed)
? ? ?Chronic Care Management ?Pharmacy Assistant  ? ?Name: Sherry Hess  MRN: [id number] DOB: [birth year] ? ? ?Reason for Encounter: Disease State call for HTN ?  ?Recent office visits:  ?None ? ?Recent consult visits:  ?07/27/21 (Oncology) Dayton Scrape NP. Seen for Neoplasm of Left Kidney. No med changes.  ? ?06/23/21 (Cardiology) [name] MD. Seen for Atrial Fibrillation. No med changes.  ? ?Hospital visits:  ?None ? ?Medications: ?Outpatient Encounter Medications as of 07/29/2021  ?Medication Sig  ? atorvastatin (LIPITOR) 20 MG tablet TAKE 1 TABLET BY MOUTH DAILY  ? Calcium Citrate (CAL-CITRATE PO) Take 600 mg by mouth daily.  ? carvedilol (COREG) 25 MG tablet TAKE 1 TABLET BY MOUTH 2 TIMES DAILY WITH A MEAL  ? diltiazem (CARDIZEM CD) 180 MG 24 hr capsule TAKE 1 CAPSULE BY MOUTH DAILY (Patient taking differently: Take 180 mg by mouth daily.)  ? donepezil (ARICEPT) 10 MG tablet Take 1 tablet (10 mg total) by mouth at bedtime.  ? Ferrous Sulfate ([employer] SLOW RELEASE IRON PO) Take 325 mg by mouth daily.  ? Fish Oil-Cholecalciferol (FISH OIL + D3) 1000-1000 MG-UNIT CAPS Take 1 capsule by mouth daily.  ? FLUoxetine (PROZAC) 10 MG capsule TAKE 1 CAPSULE BY MOUTH ONCE DAILY (Patient taking differently: Take 10 mg by mouth daily.)  ? furosemide (LASIX) 20 MG tablet Take 1 tablet (20 mg total) by mouth daily.  ? hydrALAZINE (APRESOLINE) 50 MG tablet TAKE 1 TABLET BY MOUTH 2 TIMES DAILY  ? levothyroxine (SYNTHROID) 25 MCG tablet TAKE 1 TABLET BY MOUTH ONCE DAILY ON AN EMPTY STOMACH 30 MINUTES BEFORE BREAKFAST (Patient taking differently: Take 25 mcg by mouth daily before breakfast.)  ? memantine (NAMENDA) 10 MG tablet TAKE 1 TABLET BY MOUTH 2 TIMES DAILY  ? Multiple Vitamin (MULTIVITAMIN) tablet Take 1 tablet by mouth daily. Unknown strength  ? omeprazole (PRILOSEC) 20 MG capsule TAKE 1 CAPSULE BY MOUTH DAILY (Patient taking differently: Take 20 mg by mouth daily.)  ? potassium chloride (KLOR-CON) 10 MEQ tablet  Take 1 tablet (10 mEq total) by mouth 2 (two) times daily.  ? sodium chloride 1 g tablet Take 1 g by mouth daily.  ? Vitamin D, Ergocalciferol, (DRISDOL) 1.25 MG (50000 UNIT) CAPS capsule Take 1 capsule (50,000 Units total) by mouth 2 (two) times a week.  ? ?No facility-administered encounter medications on file as of 07/29/2021.  ? ? ? ?Recent Office Vitals: ?BP Readings from Last 3 Encounters:  ?07/27/21 (!) 115/58  ?06/23/21 120/66  ?05/28/21 (!) 159/71  ? ?Pulse Readings from Last 3 Encounters:  ?07/27/21 61  ?06/23/21 70  ?05/28/21 (!) 59  ?  ?Wt Readings from Last 3 Encounters:  ?07/27/21 122 lb 1.6 oz (55.4 kg)  ?06/23/21 122 lb 12.8 oz (55.7 kg)  ?05/28/21 123 lb 8 oz (56 kg)  ?  ? ?Kidney Function ?Lab Results  ?Component Value Date/Time  ? CREATININE 2.1 (A) 07/27/2021 12:00 AM  ? CREATININE 1.9 (A) 05/28/2021 12:00 AM  ? CREATININE 1.84 (H) 04/03/2021 10:07 AM  ? CREATININE 1.89 (H) 01/07/2021 02:31 PM  ? GFRNONAA 23 (L) 07/09/2020 10:04 AM  ? GFRAA 26 (L) 07/09/2020 10:04 AM  ? ? ?BMP Latest Ref Rng & Units 07/27/2021 05/28/2021 04/03/2021  ?Glucose 70 - 99 mg/dL - - 105(H)  ?BUN 4 - 21 32(A) 33(A) 31(H)  ?Creatinine 0.5 - 1.1 2.1(A) 1.9(A) 1.84(H)  ?BUN/Creat Ratio 12 - 28 - - 17  ?Sodium 137 - 147 135(A) 138 142  ?  Potassium 3.5 - 5.1 mEq/L 4.1 4.7 4.8  ?Chloride 99 - 108 101 104 104  ?CO2 13 - 22 26(A) 26(A) 19(L)  ?Calcium 8.7 - 10.7 8.8 8.9 9.2  ? ? ? ?Current antihypertensive regimen:  ?Carvedilol '25mg'$  1 tab two times daily  ?Furosemide '20mg'$  daily  ?Patient verbally confirms she is taking the above medications as directed. Yes ? ?How often are you checking your Blood Pressure? infrequently ? ?she checks her blood pressure in the morning before taking her medication. ? ?Current home BP readings: 07/29/21 103/63 ? ?Wrist or arm cuff:Arm  ?Caffeine intake:1 cup of coffee in Am  ?Salt intake:Limited  ?OTC medications including pseudoephedrine or NSAIDs?None ? ?Any readings above 180/120? No ? ?What recent  interventions/DTPs have been made by any provider to improve Blood Pressure control since last CPP Visit: Pt denies any changes  ? ?Any recent hospitalizations or ED visits since last visit with CPP? No ? ?What diet changes have been made to improve Blood Pressure Control?  ?Pt denies any changes  ? ?What exercise is being done to improve your Blood Pressure Control?  ?Pt is not currently getting regular exercise  ? ?Adherence Review: ?Is the patient currently on ACE/ARB medication? No ?Does the patient have >5 day gap between last estimated fill dates? CPP to review ? ?Care Gaps: ?Last annual wellness visit?None noted  ? ?Star Rating Drugs:  ?Medication:  Last Fill: Day Supply ?None noted  ? ?Elray Mcgregor, CMA ?Clinical Pharmacist Assistant  ?[phone]  ?

## 2021-07-29 NOTE — Progress Notes (Deleted)
? ? ?Chronic Care Management ?Pharmacy Assistant  ? ?Name: Sherry Hess  MRN: [id number] DOB: [birth year] ? ?Reason for Encounter: Disease State callfor HTN ?  ?Recent office visits:  ?None ? ?Recent consult visits:  ?07/27/21 (Oncology) Dayton Scrape NP. Seen for Neoplasm of left kidney. No med changes.  ? ?06/23/21 (Cardiology) [name] MD. Seen for Atrial Fibrillation. No med changes. ? ?Hospital visits:  ?Nonw ? ?Medications: ?Outpatient Encounter Medications as of 07/29/2021  ?Medication Sig  ? atorvastatin (LIPITOR) 20 MG tablet TAKE 1 TABLET BY MOUTH DAILY  ? Calcium Citrate (CAL-CITRATE PO) Take 600 mg by mouth daily.  ? carvedilol (COREG) 25 MG tablet TAKE 1 TABLET BY MOUTH 2 TIMES DAILY WITH A MEAL  ? diltiazem (CARDIZEM CD) 180 MG 24 hr capsule TAKE 1 CAPSULE BY MOUTH DAILY (Patient taking differently: Take 180 mg by mouth daily.)  ? donepezil (ARICEPT) 10 MG tablet Take 1 tablet (10 mg total) by mouth at bedtime.  ? Ferrous Sulfate ([employer]S SLOW RELEASE IRON PO) Take 325 mg by mouth daily.  ? Fish Oil-Cholecalciferol (FISH OIL + D3) 1000-1000 MG-UNIT CAPS Take 1 capsule by mouth daily.  ? FLUoxetine (PROZAC) 10 MG capsule TAKE 1 CAPSULE BY MOUTH ONCE DAILY (Patient taking differently: Take 10 mg by mouth daily.)  ? furosemide (LASIX) 20 MG tablet Take 1 tablet (20 mg total) by mouth daily.  ? hydrALAZINE (APRESOLINE) 50 MG tablet TAKE 1 TABLET BY MOUTH 2 TIMES DAILY  ? levothyroxine (SYNTHROID) 25 MCG tablet TAKE 1 TABLET BY MOUTH ONCE DAILY ON AN EMPTY STOMACH 30 MINUTES BEFORE BREAKFAST (Patient taking differently: Take 25 mcg by mouth daily before breakfast.)  ? memantine (NAMENDA) 10 MG tablet TAKE 1 TABLET BY MOUTH 2 TIMES DAILY  ? Multiple Vitamin (MULTIVITAMIN) tablet Take 1 tablet by mouth daily. Unknown strength  ? omeprazole (PRILOSEC) 20 MG capsule TAKE 1 CAPSULE BY MOUTH DAILY (Patient taking differently: Take 20 mg by mouth daily.)  ? potassium chloride (KLOR-CON) 10 MEQ tablet Take  1 tablet (10 mEq total) by mouth 2 (two) times daily.  ? sodium chloride 1 g tablet Take 1 g by mouth daily.  ? Vitamin D, Ergocalciferol, (DRISDOL) 1.25 MG (50000 UNIT) CAPS capsule Take 1 capsule (50,000 Units total) by mouth 2 (two) times a week.  ? ?No facility-administered encounter medications on file as of 07/29/2021.  ? ? ? ?Recent Office Vitals: ?BP Readings from Last 3 Encounters:  ?07/27/21 (!) 115/58  ?06/23/21 120/66  ?05/28/21 (!) 159/71  ? ?Pulse Readings from Last 3 Encounters:  ?07/27/21 61  ?06/23/21 70  ?05/28/21 (!) 59  ?  ?Wt Readings from Last 3 Encounters:  ?07/27/21 122 lb 1.6 oz (55.4 kg)  ?06/23/21 122 lb 12.8 oz (55.7 kg)  ?05/28/21 123 lb 8 oz (56 kg)  ?  ? ?Kidney Function ?Lab Results  ?Component Value Date/Time  ? CREATININE 2.1 (A) 07/27/2021 12:00 AM  ? CREATININE 1.9 (A) 05/28/2021 12:00 AM  ? CREATININE 1.84 (H) 04/03/2021 10:07 AM  ? CREATININE 1.89 (H) 01/07/2021 02:31 PM  ? GFRNONAA 23 (L) 07/09/2020 10:04 AM  ? GFRAA 26 (L) 07/09/2020 10:04 AM  ? ? ?BMP Latest Ref Rng & Units 07/27/2021 05/28/2021 04/03/2021  ?Glucose 70 - 99 mg/dL - - 105(H)  ?BUN 4 - 21 32(A) 33(A) 31(H)  ?Creatinine 0.5 - 1.1 2.1(A) 1.9(A) 1.84(H)  ?BUN/Creat Ratio 12 - 28 - - 17  ?Sodium 137 - 147 135(A) 138 142  ?Potassium 3.5 -  5.1 mEq/L 4.1 4.7 4.8  ?Chloride 99 - 108 101 104 104  ?CO2 13 - 22 26(A) 26(A) 19(L)  ?Calcium 8.7 - 10.7 8.8 8.9 9.2  ? ? ? ?Current antihypertensive regimen:  ?*** ? Patient verbally confirms she is taking the above medications as directed. {yes/no:20286} ? ?How often are you checking your Blood Pressure? {CHL HP BP Monitoring Frequency:[id number]} ? ?she checks her blood pressure {timing:25218} {before/after:25217} taking her medication. ? ?Current home BP readings: *** ? ?DATE:             BP               PULSE ? ? ? ? ?Wrist or arm cuff: ?Caffeine intake: ?Salt intake: ?OTC medications including pseudoephedrine or NSAIDs? ? ?Any readings above 180/120? {yes/no:20286} ?If yes  any symptoms of hypertensive emergency? {hypertensive emergency symptoms:25354} ? ? ?What recent interventions/DTPs have been made by any provider to improve Blood Pressure control since last CPP Visit: *** ? ?Any recent hospitalizations or ED visits since last visit with CPP? {yes/no:20286} ? ?What diet changes have been made to improve Blood Pressure Control?  ?*** ? ?What exercise is being done to improve your Blood Pressure Control?  ?*** ? ?Adherence Review: ?Is the patient currently on ACE/ARB medication? {yes/no:20286} ?Does the patient have >5 day gap between last estimated fill dates? CPP to review ? ?Care Gaps: ?Last annual wellness visit? ? ?Star Rating Drugs: *** ?Medication:  Last Fill: Day Supply  ?

## 2021-07-29 NOTE — Telephone Encounter (Signed)
Called and spoke to patient son Birdie Hopes about setting appointment for patient AWV, Son denied AWV for patient. ?

## 2021-07-31 ENCOUNTER — Ambulatory Visit (INDEPENDENT_AMBULATORY_CARE_PROVIDER_SITE_OTHER): Payer: Medicare Other | Admitting: Family Medicine

## 2021-07-31 ENCOUNTER — Other Ambulatory Visit: Payer: Self-pay

## 2021-07-31 VITALS — BP 100/60 | HR 68 | Temp 97.2°F | Resp 18 | Ht 63.0 in | Wt 123.6 lb

## 2021-07-31 DIAGNOSIS — E039 Hypothyroidism, unspecified: Secondary | ICD-10-CM

## 2021-07-31 DIAGNOSIS — C642 Malignant neoplasm of left kidney, except renal pelvis: Secondary | ICD-10-CM

## 2021-07-31 DIAGNOSIS — I129 Hypertensive chronic kidney disease with stage 1 through stage 4 chronic kidney disease, or unspecified chronic kidney disease: Secondary | ICD-10-CM

## 2021-07-31 DIAGNOSIS — J9611 Chronic respiratory failure with hypoxia: Secondary | ICD-10-CM

## 2021-07-31 DIAGNOSIS — I700 Atherosclerosis of aorta: Secondary | ICD-10-CM

## 2021-07-31 DIAGNOSIS — Z9981 Dependence on supplemental oxygen: Secondary | ICD-10-CM

## 2021-07-31 DIAGNOSIS — I482 Chronic atrial fibrillation, unspecified: Secondary | ICD-10-CM

## 2021-07-31 DIAGNOSIS — F0150 Vascular dementia without behavioral disturbance: Secondary | ICD-10-CM | POA: Diagnosis not present

## 2021-07-31 DIAGNOSIS — J90 Pleural effusion, not elsewhere classified: Secondary | ICD-10-CM

## 2021-07-31 DIAGNOSIS — D696 Thrombocytopenia, unspecified: Secondary | ICD-10-CM

## 2021-07-31 DIAGNOSIS — N184 Chronic kidney disease, stage 4 (severe): Secondary | ICD-10-CM

## 2021-07-31 DIAGNOSIS — E559 Vitamin D deficiency, unspecified: Secondary | ICD-10-CM

## 2021-07-31 DIAGNOSIS — D631 Anemia in chronic kidney disease: Secondary | ICD-10-CM

## 2021-07-31 DIAGNOSIS — I5032 Chronic diastolic (congestive) heart failure: Secondary | ICD-10-CM | POA: Diagnosis not present

## 2021-07-31 DIAGNOSIS — E782 Mixed hyperlipidemia: Secondary | ICD-10-CM

## 2021-07-31 NOTE — Progress Notes (Signed)
Subjective:  Patient ID: Sherry Hess, female    DOB: 05-28-1930  Age: 88 y.o. MRN: [id number]  Chief Complaint  Patient presents with   Hyperlipidemia   Hypertension    HPI  Patient is an 88 yo WF with pmhx of A. Fib, diastolic CONGESTIVE HEART FAILURE, CKD stage 4, strokes, hypertension, hyperlipidemia, vascular dementia and chronic respiratory failure with hypoxia on 2 L of oxygen, who presents for chronic follow up.   Atrial fibrillaiion: on diltiazem, coreg, poor candidate for blood thinners because fall risk.  Hyperlipidemia: Complications: CVA, aortic atherosclerosis Current medications: on lipitor 20 mg daily.   Hypertension: Complications: CKD stage 4, CVA, Diastolic CONGESTIVE HEART FAILURE.  Current medications: coreg 25 mg one twice daily, diltiazem 180 mg 24 hr capsule daily, hydralazine 50 mg one twice daily, furosemide 20 mg one daily and potassium chloride 10 meq 2 daily.   Anemia of Chronic disease related to CKD stage 4. Being managed by Dr. Gilman Buttner (Hem Onc.)  GERD: on omeprazole 20 mg daily.   Vascular dementia: on aricept 10 mg before bed, namenda 10 mg twice daily Memory appears to be fairly stable. [name] not driving.   Hypothyroidism: currently on synthroid 25 mcg daily.   Diet: fairly healthy Exercise: walks some  Right renal carcinoma initially diagnosed in July 2022. Patient is seeing oncology. [name] not a good surgical candidate and she an dher family have decided to not aggressively treat due to her age, potential for complications, and currently her quality of life is quite good. She lives with her son, but all her children (2 sons and 1 daughter) are very supportive.   Patient Care Team: Georgeanna Lea, MD as Consulting Physician (Cardiology) Bufford Buttner, MD as Consulting Physician (Nephrology) Dellia Beckwith, MD as Consulting Physician (Oncology)   Current Outpatient Medications on File Prior to Visit  Medication Sig  Dispense Refill   atorvastatin (LIPITOR) 20 MG tablet TAKE 1 TABLET BY MOUTH DAILY 90 tablet 0   Calcium Citrate (CAL-CITRATE PO) Take 600 mg by mouth daily.     carvedilol (COREG) 25 MG tablet TAKE 1 TABLET BY MOUTH 2 TIMES DAILY WITH A MEAL 180 tablet 1   diltiazem (CARDIZEM CD) 180 MG 24 hr capsule TAKE 1 CAPSULE BY MOUTH DAILY (Patient taking differently: Take 180 mg by mouth daily.) 90 capsule 3   donepezil (ARICEPT) 10 MG tablet Take 1 tablet (10 mg total) by mouth at bedtime. 90 tablet 1   Ferrous Sulfate ([employer] SLOW RELEASE IRON PO) Take 325 mg by mouth daily.     Fish Oil-Cholecalciferol (FISH OIL + D3) 1000-1000 MG-UNIT CAPS Take 1 capsule by mouth daily.     FLUoxetine (PROZAC) 10 MG capsule TAKE 1 CAPSULE BY MOUTH ONCE DAILY (Patient taking differently: Take 10 mg by mouth daily.) 90 capsule 1   hydrALAZINE (APRESOLINE) 50 MG tablet TAKE 1 TABLET BY MOUTH 2 TIMES DAILY 180 tablet 1   levothyroxine (SYNTHROID) 25 MCG tablet TAKE 1 TABLET BY MOUTH ONCE DAILY ON AN EMPTY STOMACH 30 MINUTES BEFORE BREAKFAST (Patient taking differently: Take 25 mcg by mouth daily before breakfast.) 90 tablet 3   memantine (NAMENDA) 10 MG tablet TAKE 1 TABLET BY MOUTH 2 TIMES DAILY 180 tablet 1   Multiple Vitamin (MULTIVITAMIN) tablet Take 1 tablet by mouth daily. Unknown strength     omeprazole (PRILOSEC) 20 MG capsule TAKE 1 CAPSULE BY MOUTH DAILY (Patient taking differently: Take 20 mg by mouth daily.) 90 capsule  3   potassium chloride (KLOR-CON) 10 MEQ tablet Take 1 tablet (10 mEq total) by mouth 2 (two) times daily. 60 tablet 5   sodium chloride 1 g tablet Take 1 g by mouth daily.     Vitamin D, Ergocalciferol, (DRISDOL) 1.25 MG (50000 UNIT) CAPS capsule Take 1 capsule (50,000 Units total) by mouth 2 (two) times a week. (Patient taking differently: Take 50,000 Units by mouth every 7 (seven) days.) 24 capsule 1   furosemide (LASIX) 20 MG tablet Take 1 tablet (20 mg total) by mouth daily. (Patient taking  differently: Take by mouth daily as needed.) 90 tablet 3   No current facility-administered medications on file prior to visit.   Past Medical History:  Diagnosis Date   Anxiety    Benign neoplasm of rectum    Benign neoplasm of sigmoid colon    Chest pain in adult 12/27/2015   CKD (chronic kidney disease) stage 4, GFR 15-29 ml/min (HCC) 07/02/2019   CVA (cerebral vascular accident) (HCC) 03/08/2017   Dehydration 01/14/2021   Dyspepsia 03/09/2017   Dyspepsia 03/09/2017   Essential hypertension 11/20/2014   Fatigue 12/10/2016   High cholesterol    HLD (hyperlipidemia) 03/09/2017   Hx of transient ischemic attack (TIA) 10/18/2016   Hypertension    Hypertension, renal disease, stage 1-4 or unspecified chronic kidney disease 07/02/2019   Mixed hyperlipidemia 03/09/2017   Normocytic anemia    Occult blood positive stool 03/09/2017   Paroxysmal atrial fibrillation (HCC) 02/23/2017   Syncope 12/10/2016   TIA (transient ischemic attack)    Vascular dementia without behavioral disturbance (HCC) 07/02/2019   Past Surgical History:  Procedure Laterality Date   ABDOMINAL HYSTERECTOMY     COLONOSCOPY N/A 03/11/2017   Procedure: COLONOSCOPY;  Surgeon: Iva Boop, MD;  Location: [facility] ENDOSCOPY;  Service: Endoscopy;  Laterality: N/A;   ESOPHAGOGASTRODUODENOSCOPY N/A 03/11/2017   Procedure: ESOPHAGOGASTRODUODENOSCOPY (EGD);  Surgeon: Iva Boop, MD;  Location: [facility] ENDOSCOPY;  Service: Endoscopy;  Laterality: N/A;   TONSILLECTOMY     VESICOVAGINAL FISTULA CLOSURE W/ TAH      Family History  Problem Relation Age of Onset   Cancer Father        lung   Stroke Mother    Cancer Brother        x2 bladder   Dementia Brother    Atrial fibrillation Son    Ataxia Neg Hx    Chorea Neg Hx    Mental retardation Neg Hx    Migraines Neg Hx    Multiple sclerosis Neg Hx    Neurofibromatosis Neg Hx    Neuropathy Neg Hx    Parkinsonism Neg Hx    Seizures Neg Hx    Social History   Socioeconomic  History   Marital status: Widowed    Spouse name: Not on file   Number of children: 3   Years of education: Not on file   Highest education level: Not on file  Occupational History   Not on file  Tobacco Use   Smoking status: Never   Smokeless tobacco: Never  Vaping Use   Vaping Use: Never used  Substance and Sexual Activity   Alcohol use: No   Drug use: No   Sexual activity: Not on file  Other Topics Concern   Not on file  Social History Narrative   Not on file   Social Determinants of Health   Financial Resource Strain: Low Risk    Difficulty of Paying Living Expenses: Not hard  at all  Food Insecurity: Not on file  Transportation Needs: Not on file  Physical Activity: Inactive   Days of Exercise per Week: 0 days   Minutes of Exercise per Session: 0 min  Stress: Not on file  Social Connections: Not on file    Review of Systems  Constitutional:  Positive for fatigue. Negative for chills and fever.  HENT:  Negative for congestion, rhinorrhea and sore throat.   Respiratory:  Negative for cough and shortness of breath.   Cardiovascular:  Negative for chest pain.  Gastrointestinal:  Negative for abdominal pain, constipation, diarrhea, nausea and vomiting.  Genitourinary:  Negative for dysuria and urgency.  Musculoskeletal:  Negative for back pain and myalgias.  Neurological:  Positive for weakness. Negative for dizziness, light-headedness and headaches.  Psychiatric/Behavioral:  Negative for dysphoric mood. The patient is not nervous/anxious.     Objective:  BP 100/60   Pulse 68   Temp (!) 97.2 F (36.2 C)   Resp 18   Ht 5\' 3"  (1.6 m)   Wt 123 lb 9.6 oz (56.1 kg)   BMI 21.89 kg/m      07/31/2021    9:17 AM 07/27/2021   11:00 AM 06/23/2021    3:03 PM  BP/Weight  Systolic BP 100 115 120  Diastolic BP 60 58 66  Wt. (Lbs) 123.6 122.1 122.8  BMI 21.89 kg/m2 21.63 kg/m2 21.08 kg/m2    Physical Exam  Diabetic Foot Exam - Simple   No data filed      Lab  Results  Component Value Date   WBC 3.1 07/27/2021   HGB 11.5 (A) 07/27/2021   HCT 36 07/27/2021   PLT 131 (A) 07/27/2021   GLUCOSE 105 (H) 04/03/2021   CHOL 81 (L) 04/03/2021   TRIG 47 04/03/2021   HDL 27 (L) 04/03/2021   LDLCALC 42 04/03/2021   ALT 12 07/27/2021   AST 15 07/27/2021   NA 135 (A) 07/27/2021   K 4.1 07/27/2021   CL 101 07/27/2021   CREATININE 2.1 (A) 07/27/2021   BUN 32 (A) 07/27/2021   CO2 26 (A) 07/27/2021   TSH 3.650 04/03/2021   INR 1.1 01/26/2017   HGBA1C 5.2 03/09/2017      Assessment & Plan:   Problem List Items Addressed This Visit       Cardiovascular and Mediastinum   Atrial fibrillation (HCC)    Continue CCB and Coreg.  Management per specialist.  Dr. Dewayne Shorter.      Chronic diastolic heart failure (HCC)    Continue lasix 20 mg daily and potassium chloride.       Aortic atherosclerosis (HCC)    On lipitor 20 mg daily.         Respiratory   Chronic respiratory failure with hypoxia (HCC) - Primary    Wears 1-2 L oygen daily      Chronic pleural effusion    Stable. Very small        Endocrine   Acquired hypothyroidism    Has been therapeutic.        Nervous and Auditory   Vascular dementia without behavioral disturbance (HCC)    Continue namenda and aricept.  Continue lipitor. Patient is not on aspirin, mainly due to fall risk. Revisit in the future.        Genitourinary   CKD (chronic kidney disease) stage 4, GFR 15-29 ml/min (HCC) (Chronic)    Stable. Management per specialist.  Sees Dr. Signe Colt.      Renal cancer (  HCC) (Chronic)    Monitoring and management by hematology oncology.       Hypertensive kidney disease with chronic kidney disease stage IV (HCC)    Bp is well controlled. No issues.          Hematopoietic and Hemostatic   Thrombocytopenia (HCC)     Other   Anemia due to stage 4 chronic kidney disease (HCC) (Chronic)    Stable. Management per specialist.        Mixed hyperlipidemia    Well  controlled.  No changes to medicines. Continue lipitro 20 mg daily.  Continue to work on eating a healthy diet and exercise.  Labs drawn today.        Vitamin D deficiency    At goal.      Dependence on continuous supplemental oxygen    Uses 1-2 L of oxygen continuously.      .  Follow-up: Return in about 4 months (around 11/30/2021).  An After Visit Summary was printed and given to the patient.  Blane Ohara, MD [facility] [phone]

## 2021-08-09 ENCOUNTER — Encounter: Payer: Self-pay | Admitting: Family Medicine

## 2021-08-09 DIAGNOSIS — J90 Pleural effusion, not elsewhere classified: Secondary | ICD-10-CM | POA: Insufficient documentation

## 2021-08-09 DIAGNOSIS — I700 Atherosclerosis of aorta: Secondary | ICD-10-CM | POA: Insufficient documentation

## 2021-08-09 DIAGNOSIS — D696 Thrombocytopenia, unspecified: Secondary | ICD-10-CM | POA: Insufficient documentation

## 2021-08-09 DIAGNOSIS — Z9981 Dependence on supplemental oxygen: Secondary | ICD-10-CM | POA: Insufficient documentation

## 2021-08-09 NOTE — Assessment & Plan Note (Signed)
Continue namenda and aricept.  ?Continue lipitor. Patient is not on aspirin, mainly due to fall risk. Revisit in the future. ?

## 2021-08-09 NOTE — Assessment & Plan Note (Signed)
On lipitor 20 mg daily.  ?

## 2021-08-09 NOTE — Assessment & Plan Note (Signed)
At goal.  

## 2021-08-09 NOTE — Assessment & Plan Note (Signed)
Monitoring and management by hematology oncology.  ?

## 2021-08-09 NOTE — Assessment & Plan Note (Signed)
Well controlled.  ?No changes to medicines. Continue lipitro 20 mg daily.  ?Continue to work on eating a healthy diet and exercise.  ?Labs drawn today.  ? ?

## 2021-08-09 NOTE — Assessment & Plan Note (Signed)
Bp is well controlled. No issues.  ? ?

## 2021-08-09 NOTE — Assessment & Plan Note (Signed)
Has been therapeutic. ?

## 2021-08-09 NOTE — Assessment & Plan Note (Signed)
Stable. Management per specialist.  Sees Dr. Hollie Salk. ?

## 2021-08-09 NOTE — Assessment & Plan Note (Addendum)
Stable. Very small ?

## 2021-08-09 NOTE — Assessment & Plan Note (Signed)
Continue lasix 20 mg daily and potassium chloride.  ?

## 2021-08-09 NOTE — Assessment & Plan Note (Signed)
Stable. Management per specialist.   

## 2021-08-09 NOTE — Assessment & Plan Note (Signed)
Uses 1-2 L of oxygen continuously.  ?

## 2021-08-09 NOTE — Assessment & Plan Note (Signed)
Wears 1-2 L oygen daily ?

## 2021-08-09 NOTE — Assessment & Plan Note (Signed)
Continue CCB and Coreg.  ?Management per specialist.  ?Dr. Kirkland Hun. ?

## 2021-08-22 DIAGNOSIS — J449 Chronic obstructive pulmonary disease, unspecified: Secondary | ICD-10-CM | POA: Diagnosis not present

## 2021-08-23 ENCOUNTER — Other Ambulatory Visit: Payer: Self-pay | Admitting: Oncology

## 2021-08-23 DIAGNOSIS — C642 Malignant neoplasm of left kidney, except renal pelvis: Secondary | ICD-10-CM

## 2021-08-23 NOTE — Progress Notes (Signed)
?[facility]  ?[address] ?[place],  [place]  [address] ?[phone] ? ?Clinic Day:  08/24/21 ? ?Referring physician: [name], MD ? ?CHIEF COMPLAINT:  ?CC: Left renal mass ? ?Current Treatment:  Observation ? ? ?HISTORY OF PRESENT ILLNESS:  ?Sherry Hess is a 88 y.o. female referred by Dr. Joie Bimler in August 2022 for the evaluation and treatment of newly diagnosed left renal carcinoma.  This began when the patient presented to the emergency department in June due to weakness, acute renal failure and dehydration.  Initial BUN was 76 with a creatinine of 3.1.  CT chest, abdomen and pelvis revealed a large centrally necrotic mass lesion which appeared to arise from the upper pole of the left kidney, measuring approximately 16.9 x 12.5 cm in greatest AP and transverse dimensions and extends for approximately 24 cm in craniocaudad projection, consistent with renal cell carcinoma till proven otherwise.  She was administered IV fluids with improvement in her BUN to 25 and creatinine to 1.3 at the time of discharge.  She was also administered IV iron and she continues oral supplement.  She was referred to Dr. Nila Nephew for outpatient evaluation, and she was deemed not to be a surgical candidate due to her multiple comorbidities.  She has a medical history significant for degenerative disc disease, anemia, dementia, TIA and CVA, hypertension, congestive heart failure and syncope. ? ?INTERVAL HISTORY:  ?Sherry Hess is here for routine follow up and states that she is doing fairly well and denies complaints. She denies abdominal pain or hematuria. White count has increased back up to 3.0 from 2.7 with an [facility], hemoglobin is stable at 11.2 and platelets are normal. Chemistries are unremarkable except for a BUN of 31, and a creatinine of 1.8, previously 1.9. Her  appetite is good, and she has gained 2-1/2 pounds since her last visit.  She denies fever, chills or other signs of  infection.  She denies nausea, vomiting, bowel issues, or abdominal pain.  She denies sore throat, cough, dyspnea, or chest pain. ? ?REVIEW OF SYSTEMS:  ?Review of Systems  ?Constitutional: Negative.  Negative for appetite change, chills, fatigue, fever and unexpected weight change.  ?HENT:  Negative.    ?Eyes: Negative.   ?Respiratory:  Negative for chest tightness, cough, hemoptysis, shortness of breath and wheezing.   ?Cardiovascular: Negative.  Negative for chest pain, leg swelling and palpitations.  ?Gastrointestinal: Negative.  Negative for abdominal distention, abdominal pain, blood in stool, constipation, diarrhea, nausea and vomiting.  ?Endocrine: Negative.   ?Genitourinary: Negative.  Negative for difficulty urinating, dysuria, frequency and hematuria.   ?Musculoskeletal: Negative.  Negative for arthralgias, back pain, flank pain, gait problem and myalgias.  ?Skin: Negative.   ?Neurological: Negative.  Negative for dizziness, extremity weakness, gait problem, headaches, light-headedness, numbness, seizures and speech difficulty.  ?Hematological: Negative.   ?Psychiatric/Behavioral: Negative.  Negative for depression and sleep disturbance. The patient is not nervous/anxious.    ? ?VITALS:  ?Blood pressure (!) 141/65, pulse 70, temperature 97.8 ?F (36.6 ?C), temperature source Oral, resp. rate 20, height '5\' 3"'$  (1.6 m), weight 124 lb 11.2 oz (56.6 kg), SpO2 (!) 88 %.  ?Wt Readings from Last 3 Encounters:  ?08/24/21 124 lb 11.2 oz (56.6 kg)  ?07/31/21 123 lb 9.6 oz (56.1 kg)  ?07/27/21 122 lb 1.6 oz (55.4 kg)  ?  ?Body mass index is 22.09 kg/m?. ? ?Performance status (ECOG): 0 - Asymptomatic ? ?PHYSICAL EXAM:  ?Physical Exam ?Constitutional:   ?  General: [name] not in acute distress. ?   Appearance: Normal appearance. [name] normal weight.  ?HENT:  ?   Head: Normocephalic and atraumatic.  ?Eyes:  ?   General: No scleral icterus. ?   Extraocular Movements: Extraocular movements intact.  ?    Conjunctiva/sclera: Conjunctivae normal.  ?   Pupils: Pupils are equal, round, and reactive to light.  ?Cardiovascular:  ?   Rate and Rhythm: Regular rhythm.  ?   Pulses: Normal pulses.  ?   Heart sounds: Normal heart sounds. No murmur heard. ?  No friction rub. No gallop.  ?Pulmonary:  ?   Effort: Pulmonary effort is normal. No respiratory distress.  ?Abdominal:  ?   General: Bowel sounds are normal. There is no distension.  ?   Palpations: Abdomen is soft. There is no hepatomegaly or splenomegaly.  ?   Tenderness: There is no abdominal tenderness.  ?    She has a large mass which is 16 cm below the left costal margin and 14 cm wide.  This is very firm and nontender and occupies most of her left abdomen. ?Musculoskeletal:     ?   General: Normal range of motion.  ?   Cervical back: Normal range of motion and neck supple.  ?   Right lower leg: No edema.  ?   Left lower leg: No edema.  ?Lymphadenopathy:  ?   Cervical: No cervical adenopathy.  ?Skin: ?   General: Skin is warm and dry.  ?Neurological:  ?   General: No focal deficit present.  ?   Mental Status: [name] alert and oriented to person, place, and time. Mental status is at baseline.  ?Psychiatric:     ?   Mood and Affect: Mood normal.     ?   Behavior: Behavior normal.     ?   Thought Content: Thought content normal.     ?   Judgment: Judgment normal.  ?  ? ?LABS:  ? ? ?  Latest Ref Rng & Units 08/24/2021  ? 12:00 AM 07/27/2021  ? 12:00 AM 05/28/2021  ? 12:00 AM  ?CBC  ?WBC  3.0      3.1      2.7       ?Hemoglobin 12.0 - 16.0 11.2      11.5      11.5       ?Hematocrit 36 - 46 36      36      36       ?Platelets 150 - 400 K/uL 182      131      162       ?  ? This result is from an external source.  ? ? ?  Latest Ref Rng & Units 08/24/2021  ? 12:00 AM 07/27/2021  ? 12:00 AM 05/28/2021  ? 12:00 AM  ?CMP  ?BUN 4 - 21 31      32      33       ?Creatinine 0.5 - 1.1 1.8      2.1      1.9       ?Sodium 137 - 147 138      135      138       ?Potassium 3.5 - 5.1 mEq/L 4.8       4.1      4.7       ?Chloride 99 - 108 99  101      104       ?CO2 13 - '22 30      26      26       '$ ?Calcium 8.7 - 10.7 8.9      8.8      8.9       ?Alkaline Phos 25 - 125 63      60      64       ?AST 13 - 35 '19      15      21       '$ ?ALT 7 - 35 U/L '10      12      15       '$ ?  ? This result is from an external source.  ? ? ? ?Lab Results  ?Component Value Date  ? CEA1 2.4 03/10/2017  ? /  ?CEA  ?Date Value Ref Range Status  ?03/10/2017 2.4 0.0 - 4.7 ng/mL Final  ?  Comment:  ?  (NOTE) ?      Roche ECLIA methodology       Nonsmokers  <3.9 ?                                    Smokers     <5.6 ?Performed At: [facility] ?[address] [place], [place] [id number] ?Lindon Romp MD [id number] ?  ? ?Lab Results  ?Component Value Date  ? TIBC 294 08/24/2021  ? TIBC 301 03/10/2017  ? FERRITIN 132 08/24/2021  ? FERRITIN 132 03/10/2017  ? IRONPCTSAT 16 08/24/2021  ? IRONPCTSAT 7 (L) 03/10/2017  ? ?Lab Results  ?Component Value Date  ? LDH 106 01/14/2021  ? ? ?STUDIES:  ?No results found.  ? ? ?HISTORY:  ? ?Allergies:  ?Allergies  ?Allergen Reactions  ? Minoxidil   ?  rash  ? Norvasc [Amlodipine Besylate]   ?  rash  ? Clonidine Derivatives Rash  ?  [name]  ? ? ?Current Medications: ?Current Outpatient Medications  ?Medication Sig Dispense Refill  ? atorvastatin (LIPITOR) 20 MG tablet TAKE 1 TABLET BY MOUTH DAILY 90 tablet 0  ? Calcium Citrate (CAL-CITRATE PO) Take 600 mg by mouth daily.    ? carvedilol (COREG) 25 MG tablet TAKE 1 TABLET BY MOUTH 2 TIMES DAILY WITH A MEAL 180 tablet 1  ? diltiazem (CARDIZEM CD) 180 MG 24 hr capsule TAKE 1 CAPSULE BY MOUTH DAILY (Patient taking differently: Take 180 mg by mouth daily.) 90 capsule 3  ? donepezil (ARICEPT) 10 MG tablet Take 1 tablet (10 mg total) by mouth at bedtime. 90 tablet 1  ? Ferrous Sulfate ([employer]S SLOW RELEASE IRON PO) Take 325 mg by mouth daily.    ? Fish Oil-Cholecalciferol (FISH OIL + D3) 1000-1000 MG-UNIT CAPS Take 1 capsule by mouth  daily.    ? FLUoxetine (PROZAC) 10 MG capsule TAKE 1 CAPSULE BY MOUTH ONCE DAILY (Patient taking differently: Take 10 mg by mouth daily.) 90 capsule 1  ? furosemide (LASIX) 20 MG tablet Take 1 tablet (20 mg total) by mouth

## 2021-08-24 ENCOUNTER — Inpatient Hospital Stay: Payer: Medicare Other | Attending: Oncology | Admitting: Oncology

## 2021-08-24 ENCOUNTER — Other Ambulatory Visit: Payer: Self-pay | Admitting: Oncology

## 2021-08-24 ENCOUNTER — Inpatient Hospital Stay: Payer: Medicare Other

## 2021-08-24 ENCOUNTER — Other Ambulatory Visit: Payer: Self-pay

## 2021-08-24 VITALS — BP 141/65 | HR 70 | Temp 97.8°F | Resp 20 | Ht 63.0 in | Wt 124.7 lb

## 2021-08-24 DIAGNOSIS — C642 Malignant neoplasm of left kidney, except renal pelvis: Secondary | ICD-10-CM

## 2021-08-24 DIAGNOSIS — N184 Chronic kidney disease, stage 4 (severe): Secondary | ICD-10-CM | POA: Insufficient documentation

## 2021-08-24 DIAGNOSIS — D631 Anemia in chronic kidney disease: Secondary | ICD-10-CM

## 2021-08-24 DIAGNOSIS — D61818 Other pancytopenia: Secondary | ICD-10-CM

## 2021-08-24 DIAGNOSIS — N2889 Other specified disorders of kidney and ureter: Secondary | ICD-10-CM | POA: Diagnosis not present

## 2021-08-24 LAB — CBC: RBC: 4.31 (ref 3.87–5.11)

## 2021-08-24 LAB — FERRITIN: Ferritin: 132 ng/mL (ref 11–307)

## 2021-08-24 LAB — HEPATIC FUNCTION PANEL
ALT: 10 U/L (ref 7–35)
AST: 19 (ref 13–35)
Alkaline Phosphatase: 63 (ref 25–125)
Bilirubin, Total: 0.8

## 2021-08-24 LAB — BASIC METABOLIC PANEL
BUN: 31 — AB (ref 4–21)
CO2: 30 — AB (ref 13–22)
Chloride: 99 (ref 99–108)
Creatinine: 1.8 — AB (ref 0.5–1.1)
Glucose: 82
Potassium: 4.8 mEq/L (ref 3.5–5.1)
Sodium: 138 (ref 137–147)

## 2021-08-24 LAB — IRON AND TIBC
Iron: 47 ug/dL (ref 28–170)
Saturation Ratios: 16 % (ref 10.4–31.8)
TIBC: 294 ug/dL (ref 250–450)
UIBC: 247 ug/dL

## 2021-08-24 LAB — CBC AND DIFFERENTIAL
HCT: 36 (ref 36–46)
Hemoglobin: 11.2 — AB (ref 12.0–16.0)
Neutrophils Absolute: 1.95
Platelets: 182 10*3/uL (ref 150–400)
WBC: 3

## 2021-08-24 LAB — COMPREHENSIVE METABOLIC PANEL
Albumin: 3.9 (ref 3.5–5.0)
Calcium: 8.9 (ref 8.7–10.7)

## 2021-08-28 ENCOUNTER — Telehealth: Payer: Self-pay

## 2021-08-28 NOTE — Progress Notes (Signed)
? ? ?Chronic Care Management ?Pharmacy Assistant  ? ?Name: Sherry Hess  MRN: [id number] DOB: [birth year] ? ? ?Reason for Encounter: Disease State call for HTN  ?  ?Recent office visits:  ?07/31/21 [name] MD. Seen for Chronic Respiratory Failure. No med changes. ? ?Recent consult visits:  ?08/24/21 (Oncology) Hosie Poisson MD. Seen for Malignant Neoplasm. No med changes. ? ?Hospital visits:  ?None ? ?Medications: ?Outpatient Encounter Medications as of 08/28/2021  ?Medication Sig  ? atorvastatin (LIPITOR) 20 MG tablet TAKE 1 TABLET BY MOUTH DAILY  ? Calcium Citrate (CAL-CITRATE PO) Take 600 mg by mouth daily.  ? carvedilol (COREG) 25 MG tablet TAKE 1 TABLET BY MOUTH 2 TIMES DAILY WITH A MEAL  ? diltiazem (CARDIZEM CD) 180 MG 24 hr capsule TAKE 1 CAPSULE BY MOUTH DAILY (Patient taking differently: Take 180 mg by mouth daily.)  ? donepezil (ARICEPT) 10 MG tablet Take 1 tablet (10 mg total) by mouth at bedtime.  ? Ferrous Sulfate ([employer] SLOW RELEASE IRON PO) Take 325 mg by mouth daily.  ? Fish Oil-Cholecalciferol (FISH OIL + D3) 1000-1000 MG-UNIT CAPS Take 1 capsule by mouth daily.  ? FLUoxetine (PROZAC) 10 MG capsule TAKE 1 CAPSULE BY MOUTH ONCE DAILY (Patient taking differently: Take 10 mg by mouth daily.)  ? furosemide (LASIX) 20 MG tablet Take 1 tablet (20 mg total) by mouth daily. (Patient taking differently: Take by mouth daily as needed.)  ? hydrALAZINE (APRESOLINE) 50 MG tablet TAKE 1 TABLET BY MOUTH 2 TIMES DAILY  ? levothyroxine (SYNTHROID) 25 MCG tablet TAKE 1 TABLET BY MOUTH ONCE DAILY ON AN EMPTY STOMACH 30 MINUTES BEFORE BREAKFAST (Patient taking differently: Take 25 mcg by mouth daily before breakfast.)  ? memantine (NAMENDA) 10 MG tablet TAKE 1 TABLET BY MOUTH 2 TIMES DAILY  ? Multiple Vitamin (MULTIVITAMIN) tablet Take 1 tablet by mouth daily. Unknown strength  ? omeprazole (PRILOSEC) 20 MG capsule TAKE 1 CAPSULE BY MOUTH DAILY (Patient taking differently: Take 20 mg by mouth daily.)  ?  potassium chloride (KLOR-CON) 10 MEQ tablet Take 1 tablet (10 mEq total) by mouth 2 (two) times daily.  ? sodium chloride 1 g tablet Take 1 g by mouth daily.  ? Vitamin D, Ergocalciferol, (DRISDOL) 1.25 MG (50000 UNIT) CAPS capsule Take 1 capsule (50,000 Units total) by mouth 2 (two) times a week. (Patient taking differently: Take 50,000 Units by mouth every 7 (seven) days.)  ? ?No facility-administered encounter medications on file as of 08/28/2021.  ? ? ? ?Recent Office Vitals: ?BP Readings from Last 3 Encounters:  ?08/24/21 (!) 141/65  ?07/31/21 100/60  ?07/27/21 (!) 115/58  ? ?Pulse Readings from Last 3 Encounters:  ?08/24/21 70  ?07/31/21 68  ?07/27/21 61  ?  ?Wt Readings from Last 3 Encounters:  ?08/24/21 124 lb 11.2 oz (56.6 kg)  ?07/31/21 123 lb 9.6 oz (56.1 kg)  ?07/27/21 122 lb 1.6 oz (55.4 kg)  ?  ? ?Kidney Function ?Lab Results  ?Component Value Date/Time  ? CREATININE 1.8 (A) 08/24/2021 12:00 AM  ? CREATININE 2.1 (A) 07/27/2021 12:00 AM  ? CREATININE 1.84 (H) 04/03/2021 10:07 AM  ? CREATININE 1.89 (H) 01/07/2021 02:31 PM  ? GFRNONAA 23 (L) 07/09/2020 10:04 AM  ? GFRAA 26 (L) 07/09/2020 10:04 AM  ? ? ? ?  Latest Ref Rng & Units 08/24/2021  ? 12:00 AM 07/27/2021  ? 12:00 AM 05/28/2021  ? 12:00 AM  ?BMP  ?BUN 4 - 21 31      32  33       ?Creatinine 0.5 - 1.1 1.8      2.1      1.9       ?Sodium 137 - 147 138      135      138       ?Potassium 3.5 - 5.1 mEq/L 4.8      4.1      4.7       ?Chloride 99 - 108 99      101      104       ?CO2 13 - '22 30      26      26       '$ ?Calcium 8.7 - 10.7 8.9      8.8      8.9       ?  ? This result is from an external source.  ? ? ? ?Current antihypertensive regimen:  ?Carvedilol '25mg'$  1 tab two times daily  ?Furosemide '20mg'$  daily  ?Patient verbally confirms she is taking the above medications as directed. Yes ? ?How often are you checking your Blood Pressure? infrequently ? ?Current home BP readings: 08/28/21 114/66 ? ? ?Wrist or arm cuff:Arm  ?Caffeine intake:1 cup of  coffee in Am  ?Salt intake:Limited  ?OTC medications including pseudoephedrine or NSAIDs?None ?  ?Any readings above 180/120? No ? ?What recent interventions/DTPs have been made by any provider to improve Blood Pressure control since last CPP Visit: No changes  ? ?Any recent hospitalizations or ED visits since last visit with CPP? No ? ?What diet changes have been made to improve Blood Pressure Control?  ?Pt denies any changes  ? ?What exercise is being done to improve your Blood Pressure Control?  ?Pt is not getting regular exercise right now  ? ?Adherence Review: ?Is the patient currently on ACE/ARB medication? Yes ?Does the patient have >5 day gap between last estimated fill dates? CPP to review ? ?Care Gaps: ?Last annual wellness visit?None noted  ? ?Star Rating Drugs:  ?Medication:  Last Fill: Day Supply ?None noted  ? ?Elray Mcgregor, CMA ?Clinical Pharmacist Assistant  ?[phone]  ?

## 2021-09-05 ENCOUNTER — Encounter: Payer: Self-pay | Admitting: Hematology and Oncology

## 2021-09-05 ENCOUNTER — Encounter: Payer: Self-pay | Admitting: Oncology

## 2021-09-08 ENCOUNTER — Telehealth: Payer: Self-pay

## 2021-09-08 NOTE — Telephone Encounter (Signed)
Patient notified

## 2021-09-08 NOTE — Telephone Encounter (Signed)
-----   Message from Derwood Kaplan, MD sent at 09/05/2021  2:43 PM EDT ----- ?Regarding: call ?Can tell son her iron levels are adequate, have her continue the oral supplement ? ?

## 2021-09-16 ENCOUNTER — Telehealth: Payer: Self-pay

## 2021-09-16 NOTE — Telephone Encounter (Signed)
Left VM to call back 

## 2021-09-21 DIAGNOSIS — J449 Chronic obstructive pulmonary disease, unspecified: Secondary | ICD-10-CM | POA: Diagnosis not present

## 2021-09-22 NOTE — Progress Notes (Signed)
[facility]  [address] [place],  [place]  [address] [phone]  Clinic Day:  09/23/21  Referring physician: [name], MD  CHIEF COMPLAINT:  CC: Left renal mass  Current Treatment:  Observation   HISTORY OF PRESENT ILLNESS:  Sherry Hess is a 88 y.o. female referred by Dr. Joie Bimler in August 2022 for the evaluation and treatment of newly diagnosed left renal carcinoma.  This began when the patient presented to the emergency department in June due to weakness, acute renal failure and dehydration.  Initial BUN was 76 with a creatinine of 3.1.  CT chest, abdomen and pelvis revealed a large centrally necrotic mass lesion which appeared to arise from the upper pole of the left kidney, measuring approximately 16.9 x 12.5 cm in greatest AP and transverse dimensions and extends for approximately 24 cm in craniocaudad projection, consistent with renal cell carcinoma till proven otherwise.  She was administered IV fluids with improvement in her BUN to 25 and creatinine to 1.3 at the time of discharge.  She was also administered IV iron and she continues oral supplement.  She was referred to Dr. Nila Nephew for outpatient evaluation, and she was deemed not to be a surgical candidate due to her multiple comorbidities.  She has a medical history significant for degenerative disc disease, anemia, dementia, TIA and CVA, hypertension, congestive heart failure and syncope.  INTERVAL HISTORY:  Sherry Hess is here for routine follow up and she is doing amazingly well and denies complaints. She denies abdominal pain or hematuria.  Iron studies last month were adequate.  White count has increased back up to 3.2 with an Milner of 2000, hemoglobin is stable at 10.9 and platelets are normal. Chemistries are unremarkable except for a BUN of 33, and a creatinine of 1.9, previously 1.8. Her  appetite is good, but she has lost 2 pounds since her last visit.  She denies fever, chills or  other signs of infection.  She denies nausea, vomiting, bowel issues, or abdominal pain.  She denies sore throat, cough, dyspnea, or chest pain.  REVIEW OF SYSTEMS:  Review of Systems  Constitutional: Negative.  Negative for appetite change, chills, fatigue, fever and unexpected weight change.  HENT:  Negative.    Eyes: Negative.   Respiratory:  Negative for chest tightness, cough, hemoptysis, shortness of breath and wheezing.   Cardiovascular: Negative.  Negative for chest pain, leg swelling and palpitations.  Gastrointestinal: Negative.  Negative for abdominal distention, abdominal pain, blood in stool, constipation, diarrhea, nausea and vomiting.  Endocrine: Negative.   Genitourinary: Negative.  Negative for difficulty urinating, dysuria, frequency and hematuria.   Musculoskeletal: Negative.  Negative for arthralgias, back pain, flank pain, gait problem and myalgias.  Skin: Negative.   Neurological: Negative.  Negative for dizziness, extremity weakness, gait problem, headaches, light-headedness, numbness, seizures and speech difficulty.  Hematological: Negative.   Psychiatric/Behavioral: Negative.  Negative for depression and sleep disturbance. The patient is not nervous/anxious.     VITALS:  Blood pressure 135/61, pulse 75, temperature 97.8 F (36.6 C), temperature source Oral, resp. rate 20, height '5\' 3"'$  (1.6 m), weight 122 lb 12.8 oz (55.7 kg), SpO2 (!) 88 %.  Wt Readings from Last 3 Encounters:  09/23/21 122 lb 12.8 oz (55.7 kg)  08/24/21 124 lb 11.2 oz (56.6 kg)  07/31/21 123 lb 9.6 oz (56.1 kg)    Body mass index is 21.75 kg/m.  Performance status (ECOG): 0 - Asymptomatic  PHYSICAL EXAM:  Physical  Exam Constitutional:      General: [name] not in acute distress.    Appearance: Normal appearance. [name] normal weight.  HENT:     Head: Normocephalic and atraumatic.  Eyes:     General: No scleral icterus.    Extraocular Movements: Extraocular movements intact.      Conjunctiva/sclera: Conjunctivae normal.     Pupils: Pupils are equal, round, and reactive to light.  Cardiovascular:     Rate and Rhythm: Regular rhythm.     Pulses: Normal pulses.     Heart sounds: Normal heart sounds. No murmur heard.   No friction rub. No gallop.  Pulmonary:     Effort: Pulmonary effort is normal. No respiratory distress.  Abdominal:     General: Bowel sounds are normal. There is no distension.     Palpations: Abdomen is soft. There is no hepatomegaly or splenomegaly.     Tenderness: There is no abdominal tenderness.      She has a large mass which is 16 cm below the left costal margin and 14 cm wide.  This is very firm and nontender and occupies most of her left abdomen. Musculoskeletal:        General: Normal range of motion.     Cervical back: Normal range of motion and neck supple.     Right lower leg: No edema.     Left lower leg: No edema.  Lymphadenopathy:     Cervical: No cervical adenopathy.  Skin:    General: Skin is warm and dry.  Neurological:     General: No focal deficit present.     Mental Status: [name] alert and oriented to person, place, and time. Mental status is at baseline.  Psychiatric:        Mood and Affect: Mood normal.        Behavior: Behavior normal.        Thought Content: Thought content normal.        Judgment: Judgment normal.     LABS:      Latest Ref Rng & Units 09/23/2021   12:00 AM 08/24/2021   12:00 AM 07/27/2021   12:00 AM  CBC  WBC  3.2      3.0      3.1       Hemoglobin 12.0 - 16.0 10.9      11.2      11.5       Hematocrit 36 - 46 35      36      36       Platelets 150 - 400 K/uL 201      182      131          This result is from an external source.      Latest Ref Rng & Units 09/23/2021   12:00 AM 08/24/2021   12:00 AM 07/27/2021   12:00 AM  CMP  BUN 4 - 21 33      31      32       Creatinine 0.5 - 1.1 1.9      1.8      2.1       Sodium 137 - 147 139      138      135       Potassium 3.5 - 5.1 mEq/L 4.4       4.8      4.1       Chloride  99 - 108 102      99      101       CO2 13 - '22 26      30      26       '$ Calcium 8.7 - 10.7 8.9      8.9      8.8       Alkaline Phos 25 - 125 59      63      60       AST 13 - 35 '17      19      15       '$ ALT 7 - 35 U/L '12      10      12          '$ This result is from an external source.     Lab Results  Component Value Date   CEA1 2.4 03/10/2017   /  CEA  Date Value Ref Range Status  03/10/2017 2.4 0.0 - 4.7 ng/mL Final    Comment:    (NOTE)       Roche ECLIA methodology       Nonsmokers  <3.9                                     Smokers     <5.6 Performed At: [facility] [place], [place] [id number] Lindon Romp MD VO:[id number]    Lab Results  Component Value Date   TIBC 294 08/24/2021   TIBC 301 03/10/2017   FERRITIN 132 08/24/2021   FERRITIN 132 03/10/2017   IRONPCTSAT 16 08/24/2021   IRONPCTSAT 7 (L) 03/10/2017   Lab Results  Component Value Date   LDH 106 01/14/2021    STUDIES:  No results found.    HISTORY:   Allergies:  Allergies  Allergen Reactions   Minoxidil     rash   Norvasc [Amlodipine Besylate]     rash   Clonidine Derivatives Rash    [name]    Current Medications: Current Outpatient Medications  Medication Sig Dispense Refill   atorvastatin (LIPITOR) 20 MG tablet TAKE 1 TABLET BY MOUTH DAILY 90 tablet 0   Calcium Citrate (CAL-CITRATE PO) Take 600 mg by mouth daily.     carvedilol (COREG) 25 MG tablet TAKE 1 TABLET BY MOUTH 2 TIMES DAILY WITH A MEAL 180 tablet 1   diltiazem (CARDIZEM CD) 180 MG 24 hr capsule TAKE 1 CAPSULE BY MOUTH DAILY (Patient taking differently: Take 180 mg by mouth daily.) 90 capsule 3   donepezil (ARICEPT) 10 MG tablet TAKE 1 TABLET BY MOUTH DAILY AT BEDTIME 90 tablet 1   Ferrous Sulfate ([employer]S SLOW RELEASE IRON PO) Take 325 mg by mouth daily.     Fish Oil-Cholecalciferol (FISH OIL + D3) 1000-1000 MG-UNIT CAPS Take 1 capsule by mouth daily.      FLUoxetine (PROZAC) 10 MG capsule TAKE 1 CAPSULE BY MOUTH ONCE DAILY (Patient taking differently: Take 10 mg by mouth daily.) 90 capsule 1   furosemide (LASIX) 20 MG tablet Take 1 tablet (20 mg total) by mouth daily. (Patient taking differently: Take by mouth daily as needed.) 90 tablet 3   hydrALAZINE (APRESOLINE) 50 MG tablet TAKE 1 TABLET BY MOUTH 2 TIMES DAILY 180 tablet 1   levothyroxine (SYNTHROID) 25 MCG tablet TAKE 1 TABLET BY MOUTH  ONCE DAILY ON AN EMPTY STOMACH 30 MINUTES BEFORE BREAKFAST (Patient taking differently: Take 25 mcg by mouth daily before breakfast.) 90 tablet 3   memantine (NAMENDA) 10 MG tablet TAKE 1 TABLET BY MOUTH 2 TIMES DAILY 180 tablet 1   Multiple Vitamin (MULTIVITAMIN) tablet Take 1 tablet by mouth daily. Unknown strength     omeprazole (PRILOSEC) 20 MG capsule TAKE 1 CAPSULE BY MOUTH DAILY (Patient taking differently: Take 20 mg by mouth daily.) 90 capsule 3   potassium chloride (KLOR-CON) 10 MEQ tablet Take 1 tablet (10 mEq total) by mouth 2 (two) times daily. 60 tablet 5   sodium chloride 1 g tablet Take 1 g by mouth daily.     Vitamin D, Ergocalciferol, (DRISDOL) 1.25 MG (50000 UNIT) CAPS capsule Take 1 capsule (50,000 Units total) by mouth 2 (two) times a week. (Patient taking differently: Take 50,000 Units by mouth every 7 (seven) days.) 24 capsule 1   No current facility-administered medications for this visit.     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  We have no tissue biopsy.  Unfortunately with her age and multiple comorbidities, including dementia, she is not a candidate for surgical resection. I do not recommend chemotherapy or immunotherapy. She and her son are not interested in aggressive therapy. This was definitely enlarging but largely  asymptomatic and actually feels slightly smaller and less prominent today.  Renal insufficiency, which fluctuates up and down.    Dementia.  [name] able to answer questions and does not complain, but I rely on her son to be the historian.  Anemia, which is mildly worse.  Iron studies last month were adequate.  Her son tells me she did have IV iron but that was at least 2 years ago.  5.   Leukopenia, persistent.  I do not have an explanation for this.  We will continue to monitor.  6.   Hypoxia with a oxygen saturation at 88%.  This is a chronic problem.  She has oxygen at home.     Plan: We will continue with supportive care, and she is doing fairly well at this time.  If needed, we could consider Hospice if she started to decline, and their services have been reviewed. She knows to continue Lasix 20 mg every other day, oral potassium supplement 10 meq bid, and oral iron supplement daily.  We will see her back in 1 month with CBC and CMP for repeat evaluation.  [name] a DNR. She and her son understand and agree with this plan of care.  I have answered their questions and they know to call with any concerns.  I provided 20 minutes of face-to-face time during this this encounter and > 50% was spent counseling as documented under my assessment and plan.    Derwood Kaplan, MD [facility] AT [facility] [address] [place] [address] Dept: [phone] Dept Fax: [phone]

## 2021-09-23 ENCOUNTER — Other Ambulatory Visit: Payer: Self-pay | Admitting: Oncology

## 2021-09-23 ENCOUNTER — Inpatient Hospital Stay: Payer: Medicare Other

## 2021-09-23 ENCOUNTER — Inpatient Hospital Stay: Payer: Medicare Other | Attending: Oncology | Admitting: Oncology

## 2021-09-23 ENCOUNTER — Encounter: Payer: Self-pay | Admitting: Oncology

## 2021-09-23 VITALS — BP 135/61 | HR 75 | Temp 97.8°F | Resp 20 | Ht 63.0 in | Wt 122.8 lb

## 2021-09-23 DIAGNOSIS — E860 Dehydration: Secondary | ICD-10-CM | POA: Insufficient documentation

## 2021-09-23 DIAGNOSIS — C642 Malignant neoplasm of left kidney, except renal pelvis: Secondary | ICD-10-CM

## 2021-09-23 DIAGNOSIS — D696 Thrombocytopenia, unspecified: Secondary | ICD-10-CM | POA: Insufficient documentation

## 2021-09-23 DIAGNOSIS — D631 Anemia in chronic kidney disease: Secondary | ICD-10-CM | POA: Insufficient documentation

## 2021-09-23 DIAGNOSIS — E875 Hyperkalemia: Secondary | ICD-10-CM | POA: Insufficient documentation

## 2021-09-23 DIAGNOSIS — N184 Chronic kidney disease, stage 4 (severe): Secondary | ICD-10-CM | POA: Insufficient documentation

## 2021-09-23 LAB — CBC AND DIFFERENTIAL
HCT: 35 — AB (ref 36–46)
Hemoglobin: 10.9 — AB (ref 12.0–16.0)
Neutrophils Absolute: 2.05
Platelets: 201 10*3/uL (ref 150–400)
WBC: 3.2

## 2021-09-23 LAB — BASIC METABOLIC PANEL
BUN: 33 — AB (ref 4–21)
CO2: 26 — AB (ref 13–22)
Chloride: 102 (ref 99–108)
Creatinine: 1.9 — AB (ref 0.5–1.1)
Glucose: 100
Potassium: 4.4 mEq/L (ref 3.5–5.1)
Sodium: 139 (ref 137–147)

## 2021-09-23 LAB — HEPATIC FUNCTION PANEL
ALT: 12 U/L (ref 7–35)
AST: 17 (ref 13–35)
Alkaline Phosphatase: 59 (ref 25–125)
Bilirubin, Total: 0.7

## 2021-09-23 LAB — COMPREHENSIVE METABOLIC PANEL
Albumin: 3.9 (ref 3.5–5.0)
Calcium: 8.9 (ref 8.7–10.7)

## 2021-09-23 LAB — CBC: RBC: 4.17 (ref 3.87–5.11)

## 2021-09-28 ENCOUNTER — Telehealth: Payer: Self-pay

## 2021-09-28 NOTE — Progress Notes (Signed)
? ? ?Chronic Care Management ?Pharmacy Assistant  ? ?Name: Sherry Hess  MRN: [id number] DOB: 1930-10-14 ? ? ?Reason for Encounter: Disease State call for HTN  ?  ?Recent office visits:  ?None ? ?Recent consult visits:  ?09/23/21 (Oncology) Hosie Poisson MD. Seen for Malignant Neoplasm. No med changes. ? ?Hospital visits:  ?None ? ?Medications: ?Outpatient Encounter Medications as of 09/28/2021  ?Medication Sig  ? atorvastatin (LIPITOR) 20 MG tablet TAKE 1 TABLET BY MOUTH DAILY  ? Calcium Citrate (CAL-CITRATE PO) Take 600 mg by mouth daily.  ? carvedilol (COREG) 25 MG tablet TAKE 1 TABLET BY MOUTH 2 TIMES DAILY WITH A MEAL  ? diltiazem (CARDIZEM CD) 180 MG 24 hr capsule TAKE 1 CAPSULE BY MOUTH DAILY (Patient taking differently: Take 180 mg by mouth daily.)  ? donepezil (ARICEPT) 10 MG tablet Take 1 tablet (10 mg total) by mouth at bedtime.  ? Ferrous Sulfate ([employer]S SLOW RELEASE IRON PO) Take 325 mg by mouth daily.  ? Fish Oil-Cholecalciferol (FISH OIL + D3) 1000-1000 MG-UNIT CAPS Take 1 capsule by mouth daily.  ? FLUoxetine (PROZAC) 10 MG capsule TAKE 1 CAPSULE BY MOUTH ONCE DAILY (Patient taking differently: Take 10 mg by mouth daily.)  ? furosemide (LASIX) 20 MG tablet Take 1 tablet (20 mg total) by mouth daily. (Patient taking differently: Take by mouth daily as needed.)  ? hydrALAZINE (APRESOLINE) 50 MG tablet TAKE 1 TABLET BY MOUTH 2 TIMES DAILY  ? levothyroxine (SYNTHROID) 25 MCG tablet TAKE 1 TABLET BY MOUTH ONCE DAILY ON AN EMPTY STOMACH 30 MINUTES BEFORE BREAKFAST (Patient taking differently: Take 25 mcg by mouth daily before breakfast.)  ? memantine (NAMENDA) 10 MG tablet TAKE 1 TABLET BY MOUTH 2 TIMES DAILY  ? Multiple Vitamin (MULTIVITAMIN) tablet Take 1 tablet by mouth daily. Unknown strength  ? omeprazole (PRILOSEC) 20 MG capsule TAKE 1 CAPSULE BY MOUTH DAILY (Patient taking differently: Take 20 mg by mouth daily.)  ? potassium chloride (KLOR-CON) 10 MEQ tablet Take 1 tablet (10 mEq total) by  mouth 2 (two) times daily.  ? sodium chloride 1 g tablet Take 1 g by mouth daily.  ? Vitamin D, Ergocalciferol, (DRISDOL) 1.25 MG (50000 UNIT) CAPS capsule Take 1 capsule (50,000 Units total) by mouth 2 (two) times a week. (Patient taking differently: Take 50,000 Units by mouth every 7 (seven) days.)  ? ?No facility-administered encounter medications on file as of 09/28/2021.  ? ? ? ?Recent Office Vitals: ?BP Readings from Last 3 Encounters:  ?09/23/21 135/61  ?08/24/21 (!) 141/65  ?07/31/21 100/60  ? ?Pulse Readings from Last 3 Encounters:  ?09/23/21 75  ?08/24/21 70  ?07/31/21 68  ?  ?Wt Readings from Last 3 Encounters:  ?09/23/21 122 lb 12.8 oz (55.7 kg)  ?08/24/21 124 lb 11.2 oz (56.6 kg)  ?07/31/21 123 lb 9.6 oz (56.1 kg)  ?  ? ?Kidney Function ?Lab Results  ?Component Value Date/Time  ? CREATININE 1.9 (A) 09/23/2021 12:00 AM  ? CREATININE 1.8 (A) 08/24/2021 12:00 AM  ? CREATININE 1.84 (H) 04/03/2021 10:07 AM  ? CREATININE 1.89 (H) 01/07/2021 02:31 PM  ? GFRNONAA 23 (L) 07/09/2020 10:04 AM  ? GFRAA 26 (L) 07/09/2020 10:04 AM  ? ? ? ?  Latest Ref Rng & Units 09/23/2021  ? 12:00 AM 08/24/2021  ? 12:00 AM 07/27/2021  ? 12:00 AM  ?BMP  ?BUN 4 - 21 33      31      32       ?  Creatinine 0.5 - 1.1 1.9      1.8      2.1       ?Sodium 137 - 147 139      138      135       ?Potassium 3.5 - 5.1 mEq/L 4.4      4.8      4.1       ?Chloride 99 - 108 102      99      101       ?CO2 13 - '22 26      30      26       '$ ?Calcium 8.7 - 10.7 8.9      8.9      8.8       ?  ? This result is from an external source.  ? ? ? ?Current antihypertensive regimen:  ?Carvedilol '25mg'$  1 tab two times daily  ?Hydralazine '50mg'$  two times daily  ?Diltiazem '180mg'$  daily  ?Furosemide '20mg'$  daily  ?Patient verbally confirms she is taking the above medications as directed. Yes ? ?How often are you checking your Blood Pressure? daily ? ?she checks her blood pressure in the morning before taking her medication. ? ?Current home BP readings: 09/28/21 120/68, pt did  not have any BP readings wrote down so she took it while I was on the phone with her ? ?Wrist or arm cuff:Arm  ?Caffeine intake:1 cup of coffee in Am  ?Salt intake:Limited  ?OTC medications including pseudoephedrine or NSAIDs?None ?Any readings above 180/120? No ? ?What recent interventions/DTPs have been made by any provider to improve Blood Pressure control since last CPP Visit: No changes ? ?Any recent hospitalizations or ED visits since last visit with CPP? No ? ?What diet changes have been made to improve Blood Pressure Control?  ?No diet changes  ? ?What exercise is being done to improve your Blood Pressure Control?  ?Pt is not getting a lot of exercise ? ?Adherence Review: ?Is the patient currently on ACE/ARB medication? No ?Does the patient have >5 day gap between last estimated fill dates? CPP to review ? ?Care Gaps: ?Last annual wellness visit?None noted  ? ?Star Rating Drugs:  ?Medication:  Last Fill: Day Supply  ?None noted  ? ?Elray Mcgregor, CMA ?Clinical Pharmacist Assistant  ?[phone]  ?

## 2021-10-01 ENCOUNTER — Other Ambulatory Visit: Payer: Self-pay | Admitting: Family Medicine

## 2021-10-09 ENCOUNTER — Encounter: Payer: Self-pay | Admitting: Hematology and Oncology

## 2021-10-13 ENCOUNTER — Inpatient Hospital Stay: Payer: Medicare Other

## 2021-10-13 ENCOUNTER — Ambulatory Visit: Payer: Medicare Other | Admitting: Hematology and Oncology

## 2021-10-13 ENCOUNTER — Inpatient Hospital Stay: Payer: Medicare Other | Admitting: Hematology and Oncology

## 2021-10-13 ENCOUNTER — Telehealth: Payer: Self-pay

## 2021-10-13 ENCOUNTER — Encounter: Payer: Self-pay | Admitting: Hematology and Oncology

## 2021-10-13 VITALS — BP 123/50 | HR 92 | Temp 98.2°F | Resp 18 | Ht 63.0 in | Wt 122.6 lb

## 2021-10-13 VITALS — BP 106/54 | HR 90 | Temp 98.4°F | Resp 18 | Ht 63.0 in | Wt 122.6 lb

## 2021-10-13 DIAGNOSIS — E875 Hyperkalemia: Secondary | ICD-10-CM

## 2021-10-13 DIAGNOSIS — D631 Anemia in chronic kidney disease: Secondary | ICD-10-CM | POA: Diagnosis not present

## 2021-10-13 DIAGNOSIS — C642 Malignant neoplasm of left kidney, except renal pelvis: Secondary | ICD-10-CM

## 2021-10-13 DIAGNOSIS — D696 Thrombocytopenia, unspecified: Secondary | ICD-10-CM

## 2021-10-13 DIAGNOSIS — E860 Dehydration: Secondary | ICD-10-CM

## 2021-10-13 DIAGNOSIS — N184 Chronic kidney disease, stage 4 (severe): Secondary | ICD-10-CM | POA: Diagnosis not present

## 2021-10-13 HISTORY — DX: Hyperkalemia: E87.5

## 2021-10-13 LAB — VITAMIN B12: Vitamin B-12: 327 pg/mL (ref 180–914)

## 2021-10-13 LAB — CBC
MCV: 84 (ref 81–99)
RBC: 3.91 (ref 3.87–5.11)

## 2021-10-13 LAB — CBC AND DIFFERENTIAL
HCT: 33 — AB (ref 36–46)
Hemoglobin: 10.2 — AB (ref 12.0–16.0)
Neutrophils Absolute: 3.06
Platelets: 116 10*3/uL — AB (ref 150–400)
WBC: 4.5

## 2021-10-13 LAB — COMPREHENSIVE METABOLIC PANEL WITH GFR
Albumin: 3.6 (ref 3.5–5.0)
Calcium: 8.5 — AB (ref 8.7–10.7)

## 2021-10-13 LAB — BASIC METABOLIC PANEL
BUN: 36 — AB (ref 4–21)
CO2: 26 — AB (ref 13–22)
Chloride: 99 (ref 99–108)
Creatinine: 2 — AB (ref 0.5–1.1)
Glucose: 112
Potassium: 5.2 mEq/L — AB (ref 3.5–5.1)
Sodium: 135 — AB (ref 137–147)

## 2021-10-13 LAB — HEPATIC FUNCTION PANEL
ALT: 18 U/L (ref 7–35)
AST: 26 (ref 13–35)
Alkaline Phosphatase: 64 (ref 25–125)
Bilirubin, Total: 0.7

## 2021-10-13 MED ORDER — SODIUM CHLORIDE 0.9 % IV SOLN: Freq: Once | INTRAVENOUS | Status: AC

## 2021-10-13 NOTE — Assessment & Plan Note (Signed)
Likely due to dehydration, but she is on oral supplement. I advised her son to stop the potassium daily and only give it to her when he gives the furosemide.

## 2021-10-13 NOTE — Progress Notes (Signed)
Sherry Hess  [address] 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  We have no tissue biopsy.  Unfortunately with her age and multiple comorbidities, including dementia, she is not a candidate for surgical resection or aggressive systemic chemotherapy or immunotherapy. She and her son are not interested in aggressive therapy.  She had been doing remarkably well at her visit on May 10, but is fatigued and not eating or drinking well at this time.  As this may be due to her cancer, I asked if they wanted to consider hospice at this time.  The patient said no and her son, Sherry Hess, said he did not feel she was ready for that.  She will keep her follow up with Dr. Hinton Hess on June 10.  Dehydration [name] dehydrated again. She received IV fluids in September 2022 with a good response.  I will give her IV normal saline as close as possible to 1L today due to time limitations.  Her son is giving her furosemide as needed for shortness of breath or a 2 pound weight gain. I instructed to hold furosemide unless she gain 3 pounds or has increased shortness of breath.  CKD (chronic kidney disease) stage 4, GFR 15-29 ml/min (HCC) Worsened due to dehydration, she will receive IV fluids today.  Anemia due to stage 4 chronic kidney disease ([place]) Slowly worsening anemia, this may be multifactorial.  Recent iron studies were normal.  Her B12 was low normal in August 2022, so I will repeat that today.  Thrombocytopenia (HCC) New thrombocytopenia, which may be due to nutritional deficiency. B12 was added today.  Hyperkalemia Likely due to dehydration,  but she is on oral supplement. I advised her son to stop the potassium daily and only give it to her when he gives the furosemide.    The patient understands the plans discussed today and is in agreement with them.  She knows to contact our office if she develops concerns prior to her next appointment.   I provided 30 minutes of face-to-face time during this encounter and > 50% was spent counseling as documented under my assessment and plan.    Sherry Pickles, PA-C  [facility] AT [facility] [address] [place] [address] Dept: [phone] Dept Fax: [phone]   Orders Placed This Encounter  Procedures   CBC and differential    This external order was created through the Results Console.   CBC    This external order was created through the Results Console.   Basic metabolic panel    This external order was created through the Results Console.   Comprehensive metabolic panel    This external order was created through the Results Console.   Hepatic function panel    This external order was created through the Results Console.   CBC    This order was created through External Result Entry   Vitamin B12    Standing Status:   Future    Number of Occurrences:   1    Standing Expiration Date:   10/14/2022      CHIEF COMPLAINT:  CC: Fatigue  and weakness with decreased appetite and patient with renal cell carcinoma  Current Treatment: Supportive care  HISTORY OF PRESENT ILLNESS:  Sherry Hess is a 88 y.o. female referred by Dr. Joie Hess in August 2022 for the evaluation and treatment of left renal carcinoma.  This began when the patient presented to the emergency department in June due to weakness, acute renal failure and dehydration.  Initial BUN was 76 with a creatinine of 3.1.  CT chest, abdomen and pelvis revealed a large centrally necrotic mass lesion which appeared to arise from the upper pole of the left  kidney, measuring approximately 16.9 x 12.5 cm in greatest AP and transverse dimensions and extends for approximately 24 cm in craniocaudal projection, consistent with renal cell carcinoma until proven otherwise.  She was admitted and received IV fluids with improvement in her BUN to 25 and creatinine to 1.3 at the time of discharge.  She was also administered IV iron and she continues oral supplement.  She was referred to Dr. Nila Hess for outpatient evaluation, and she was deemed not to be a surgical candidate due to her multiple comorbidities.  She has a medical history significant for degenerative disc disease, anemia, dementia, TIA and CVA, hypertension, congestive heart failure and syncope.  We did not feel she could tolerate aggressive chemotherapy or immunotherapy, so we have offered supportive care.  She received IV fluids in September, but otherwise has done remarkably well.  INTERVAL HISTORY:  Sherry Hess is here added to the schedule today due to fatigue, weakness and decreased appetite.  [name] in a wheelchair today, where she had not been for some time now.  Her son, Sherry Hess, is concerned she may be dehydrated.  He has continued giving her furosemide 10 mg when she has increased shortness of breath or gains 2 pounds.  She continues potassium daily.  She denies abdominal pain, nausea, vomiting, diarrhea or constipation.  She denies worsening dyspnea or chest pain.  She denies fevers or chills. She denies pain. Her appetite is decreased, but they have been encouraging her to eat several small meals a day. Her weight has been stable.  Her son, Sherry Hess, who accompanies her today states she has been on vitamin D 50,000 international units weekly and asks whether or not she should continue any vitamin D.  I advised him to give her vitamin D3 1000 international units daily.  REVIEW OF SYSTEMS:  Review of Systems  Constitutional:  Positive for appetite change and fatigue. Negative for chills, fever and unexpected  weight change.  HENT:   Negative for lump/mass, mouth sores and sore throat.   Respiratory:  Positive for shortness of breath (Intermittent). Negative for cough.   Cardiovascular:  Negative for chest pain and leg swelling.  Gastrointestinal:  Negative for abdominal pain, constipation, diarrhea, nausea and vomiting.  Endocrine: Negative for hot flashes.  Genitourinary:  Negative for difficulty urinating, dysuria, frequency and hematuria.   Musculoskeletal:  Negative for arthralgias, back pain and myalgias.  Skin:  Negative for rash.  Neurological:  Negative for dizziness and headaches.  Hematological:  Negative for adenopathy. Does not bruise/bleed easily.  Psychiatric/Behavioral:  Negative for depression and sleep disturbance. The patient is not nervous/anxious.     VITALS:  Blood pressure (!) 106/54, pulse 90, temperature 98.4 F (36.9 C), temperature source Oral, resp. rate 18, height '5\' 3"'$  (1.6 m), weight 122 lb 9.6 oz (55.6 kg), SpO2 90 %.  Wt Readings from Last 3 Encounters:  10/13/21 122 lb 9.6 oz (  55.6 kg)  10/13/21 122 lb 9.6 oz (55.6 kg)  09/23/21 122 lb 12.8 oz (55.7 kg)    Body mass index is 21.72 kg/m.  Performance status (ECOG): 3 - Symptomatic, >50% confined to bed  PHYSICAL EXAM:  Physical Exam Vitals and nursing note reviewed.  Constitutional:      General: [name] not in acute distress.    Appearance: Normal appearance. [name] underweight. [name] ill-appearing (Chronically ill-appearing).  HENT:     Head: Normocephalic and atraumatic.     Mouth/Throat:     Mouth: Mucous membranes are moist.     Pharynx: Oropharynx is clear. No oropharyngeal exudate or posterior oropharyngeal erythema.  Eyes:     General: No scleral icterus.    Extraocular Movements: Extraocular movements intact.     Conjunctiva/sclera: Conjunctivae normal.     Pupils: Pupils are equal, round, and reactive to light.  Cardiovascular:     Rate and Rhythm: Normal rate and regular rhythm.      Heart sounds: Normal heart sounds. No murmur heard.   No friction rub. No gallop.  Pulmonary:     Effort: Pulmonary effort is normal.     Breath sounds: Normal breath sounds. No wheezing, rhonchi or rales.  Abdominal:     General: There is no distension.     Palpations: Abdomen is soft. There is mass (Persistent large mass in the left abdomen). There is no hepatomegaly.     Tenderness: There is no abdominal tenderness.  Musculoskeletal:        General: Normal range of motion.     Cervical back: Normal range of motion and neck supple. No tenderness.     Right lower leg: No edema.     Left lower leg: No edema.  Lymphadenopathy:     Cervical: No cervical adenopathy.     Upper Body:     Right upper body: No supraclavicular or axillary adenopathy.     Left upper body: No supraclavicular or axillary adenopathy.     Lower Body: No right inguinal adenopathy. No left inguinal adenopathy.  Skin:    General: Skin is warm and dry.     Coloration: Skin is not jaundiced.     Findings: No rash.  Neurological:     Mental Status: [name] alert and oriented to person, place, and time.     Cranial Nerves: No cranial nerve deficit.  Psychiatric:        Mood and Affect: Mood normal.        Behavior: Behavior normal.        Thought Content: Thought content normal.    LABS:      Latest Ref Rng & Units 10/13/2021   12:00 AM 09/23/2021   12:00 AM 08/24/2021   12:00 AM  CBC  WBC  4.5      3.2      3.0       Hemoglobin 12.0 - 16.0 10.2      10.9      11.2       Hematocrit 36 - 46 33      35      36       Platelets 150 - 400 K/uL 116      201      182          This result is from an external source.      Latest Ref Rng & Units 10/13/2021   12:00 AM 09/23/2021   12:00  AM 08/24/2021   12:00 AM  CMP  BUN 4 - 21 36      33      31       Creatinine 0.5 - 1.1 2.0      1.9      1.8       Sodium 137 - 147 135      139      138       Potassium 3.5 - 5.1 mEq/L 5.2      4.4      4.8       Chloride 99 -  108 99      102      99       CO2 13 - '22 26      26      30       '$ Calcium 8.7 - 10.7 8.5      8.9      8.9       Alkaline Phos 25 - 125 64      59      63       AST 13 - 35 '26      17      19       '$ ALT 7 - 35 U/L '18      12      10          '$ This result is from an external source.     Lab Results  Component Value Date   CEA1 2.4 03/10/2017   /  CEA  Date Value Ref Range Status  03/10/2017 2.4 0.0 - 4.7 ng/mL Final    Comment:    (NOTE)       Roche ECLIA methodology       Nonsmokers  <3.9                                     Smokers     <5.6 Performed At: [facility] [place], [place] [id number] Lindon Romp MD NT:[id number]    No results found for: PSA1 No results found for: CAN199 No results found for: CAN125  No results found for: Ronnald Ramp, A1GS, A2GS, Violet Baldy, MSPIKE, SPEI Lab Results  Component Value Date   TIBC 294 08/24/2021   TIBC 301 03/10/2017   FERRITIN 132 08/24/2021   FERRITIN 132 03/10/2017   IRONPCTSAT 16 08/24/2021   IRONPCTSAT 7 (L) 03/10/2017   Lab Results  Component Value Date   LDH 106 01/14/2021    STUDIES:  No results found.    HISTORY:   Past Medical History:  Diagnosis Date   Anxiety    Benign neoplasm of rectum    Benign neoplasm of sigmoid colon    Chest pain in adult 12/27/2015   CKD (chronic kidney disease) stage 4, GFR 15-29 ml/min ([place]) 07/02/2019   CVA (cerebral vascular accident) ([place]) 03/08/2017   Dehydration 01/14/2021   Dyspepsia 03/09/2017   Dyspepsia 03/09/2017   Essential hypertension 11/20/2014   Fatigue 12/10/2016   High cholesterol    HLD (hyperlipidemia) 03/09/2017   Hx of transient ischemic attack (TIA) 10/18/2016   Hyperkalemia 10/13/2021   Hypertension    Hypertension, renal disease, stage 1-4 or unspecified chronic kidney disease 07/02/2019   Mixed hyperlipidemia 03/09/2017   Normocytic anemia    Occult blood positive stool 03/09/2017  Paroxysmal atrial  fibrillation ([place]) 02/23/2017   Syncope 12/10/2016   TIA (transient ischemic attack)    Vascular dementia without behavioral disturbance ([place]) 07/02/2019    Past Surgical History:  Procedure Laterality Date   ABDOMINAL HYSTERECTOMY     COLONOSCOPY N/A 03/11/2017   Procedure: COLONOSCOPY;  Surgeon: Gatha Mayer, MD;  Location[facility] ENDOSCOPY;  Service: Endoscopy;  Laterality: N/A;   ESOPHAGOGASTRODUODENOSCOPY N/A 03/11/2017   Procedure: ESOPHAGOGASTRODUODENOSCOPY (EGD);  Surgeon: Gatha Mayer, MD;  Location: [facility] ENDOSCOPY;  Service: Endoscopy;  Laterality: N/A;   TONSILLECTOMY     VESICOVAGINAL FISTULA CLOSURE W/ TAH      Family History  Problem Relation Age of Onset   Cancer Father        lung   Stroke Mother    Cancer Brother        x2 bladder   Dementia Brother    Atrial fibrillation Son    Ataxia Neg Hx    Chorea Neg Hx    Mental retardation Neg Hx    Migraines Neg Hx    Multiple sclerosis Neg Hx    Neurofibromatosis Neg Hx    Neuropathy Neg Hx    Parkinsonism Neg Hx    Seizures Neg Hx     Social History:  reports that she has never smoked. She has never used smokeless tobacco. She reports that she does not drink alcohol and does not use drugs.The patient is accompanied by her son Sherry Hess today.  Allergies:  Allergies  Allergen Reactions   Minoxidil     rash   Norvasc [Amlodipine Besylate]     rash   Clonidine Derivatives Rash    [name]    Current Medications: Current Outpatient Medications  Medication Sig Dispense Refill   atorvastatin (LIPITOR) 20 MG tablet TAKE 1 TABLET BY MOUTH DAILY 90 tablet 0   Calcium Citrate (CAL-CITRATE PO) Take 600 mg by mouth daily.     carvedilol (COREG) 25 MG tablet TAKE 1 TABLET BY MOUTH 2 TIMES DAILY WITH A MEAL (Patient taking differently: Take 25 mg by mouth daily. Taking 12.5daily) 180 tablet 1   diltiazem (CARDIZEM CD) 180 MG 24 hr capsule TAKE 1 CAPSULE BY MOUTH DAILY (Patient taking differently: Take 180  mg by mouth daily.) 90 capsule 3   donepezil (ARICEPT) 10 MG tablet TAKE 1 TABLET BY MOUTH DAILY AT BEDTIME (Patient taking differently: Take 20 mg by mouth. Taking '20mg'$ ) 90 tablet 1   Ferrous Sulfate ([employer] SLOW RELEASE IRON PO) Take 65 tablets by mouth in the morning and at bedtime. Taking '65mg'$      Fish Oil-Cholecalciferol (FISH OIL + D3) 1000-1000 MG-UNIT CAPS Take 1 capsule by mouth 2 (two) times daily.     FLUoxetine (PROZAC) 10 MG capsule TAKE 1 CAPSULE BY MOUTH ONCE DAILY (Patient taking differently: Take 10 mg by mouth daily.) 90 capsule 1   furosemide (LASIX) 20 MG tablet Take 1 tablet (20 mg total) by mouth daily. (Patient taking differently: Take by mouth daily as needed.) 90 tablet 3   hydrALAZINE (APRESOLINE) 50 MG tablet TAKE 1 TABLET BY MOUTH 2 TIMES DAILY 180 tablet 1   levothyroxine (SYNTHROID) 25 MCG tablet TAKE 1 TABLET BY MOUTH ONCE DAILY ON AN EMPTY STOMACH 30 MINUTES BEFORE BREAKFAST (Patient taking differently: Take 25 mcg by mouth daily before breakfast.) 90 tablet 3   memantine (NAMENDA) 10 MG tablet TAKE 1 TABLET BY MOUTH 2 TIMES DAILY 180 tablet 1   Multiple Vitamin (MULTIVITAMIN) tablet  Take 1 tablet by mouth daily. Unknown strength     omeprazole (PRILOSEC) 20 MG capsule TAKE 1 CAPSULE BY MOUTH DAILY (Patient taking differently: Take 20 mg by mouth daily.) 90 capsule 3   potassium chloride (KLOR-CON) 10 MEQ tablet Take 1 tablet (10 mEq total) by mouth 2 (two) times daily. 60 tablet 5   sodium chloride 1 g tablet Take 1 g by mouth daily.     No current facility-administered medications for this visit.

## 2021-10-13 NOTE — Telephone Encounter (Signed)
I spoke with Corky Sox, pt's daughter. She states her mom is weak and more than likely dehydrated. She doesn't drink very well. She had fall yesterday, without injury. Marcie feels like she needs IVF.

## 2021-10-13 NOTE — Assessment & Plan Note (Signed)
[name] dehydrated again. She received IV fluids in September 2022 with a good response.  I will give her IV normal saline as close as possible to 1L today due to time limitations.  Her son is giving her furosemide as needed for shortness of breath or a 2 pound weight gain. I instructed to hold furosemide unless she gain 3 pounds or has increased shortness of breath.

## 2021-10-13 NOTE — Assessment & Plan Note (Addendum)
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  We have no tissue biopsy.  Unfortunately with her age and multiple comorbidities, including dementia, she is not a candidate for surgical resection or aggressive systemic chemotherapy or immunotherapy. She and her son are not interested in aggressive therapy.  She had been doing remarkably well at her visit on May 10, but is fatigued and not eating or drinking well at this time.  As this may be due to her cancer, I asked if they wanted to consider hospice at this time.  The patient said no and her son, [name], said he did not feel she was ready for that.  She will keep her follow up with Dr. Hinton Rao on June 10.

## 2021-10-13 NOTE — Assessment & Plan Note (Signed)
New thrombocytopenia, which may be due to nutritional deficiency. B12 was added today.

## 2021-10-13 NOTE — Assessment & Plan Note (Signed)
Worsened due to dehydration, she will receive IV fluids today.

## 2021-10-13 NOTE — Progress Notes (Signed)
[id number] bartell, rn spoke with Valli Glance, pa and she said she would like pt to get 73ms of ivf.

## 2021-10-13 NOTE — Assessment & Plan Note (Addendum)
Slowly worsening anemia, this may be multifactorial.  Recent iron studies were normal.  Her B12 was low normal in August 2022, so I will repeat that today.

## 2021-10-13 NOTE — Patient Instructions (Signed)

## 2021-10-14 ENCOUNTER — Other Ambulatory Visit: Payer: Medicare Other

## 2021-10-14 ENCOUNTER — Ambulatory Visit: Payer: Medicare Other | Admitting: Hematology and Oncology

## 2021-10-14 ENCOUNTER — Ambulatory Visit: Payer: Medicare Other

## 2021-10-15 ENCOUNTER — Telehealth: Payer: Medicare Other

## 2021-10-16 ENCOUNTER — Other Ambulatory Visit: Payer: Self-pay | Admitting: Pharmacist

## 2021-10-16 ENCOUNTER — Other Ambulatory Visit: Payer: Self-pay | Admitting: Family Medicine

## 2021-10-22 ENCOUNTER — Other Ambulatory Visit: Payer: Self-pay | Admitting: Oncology

## 2021-10-22 DIAGNOSIS — J449 Chronic obstructive pulmonary disease, unspecified: Secondary | ICD-10-CM | POA: Diagnosis not present

## 2021-10-22 DIAGNOSIS — D61818 Other pancytopenia: Secondary | ICD-10-CM

## 2021-10-22 NOTE — Progress Notes (Signed)
[facility]  [address] [place],  [place]  [address] [phone]  Clinic Day:  10/23/21  Referring physician: [name], MD  ASSESSMENT & PLAN:   Assessment & Plan: Renal cancer [facility]) Left renal carcinoma diagnosed in June 2022, with a mass measuring approximately 16.9 x 12.5 cm in greatest AP and transverse dimensions, and this extends for approximately 24 cm in craniocaudad projection.  We have no tissue biopsy.  Unfortunately with her age and multiple comorbidities, including dementia, she is not a candidate for surgical resection or aggressive systemic chemotherapy or immunotherapy. She and her son are not interested in aggressive therapy.  She has been doing remarkably well.    Dehydration [name] dehydrated again, but improved. She received IV fluids in September 2022 and again last month when her creatinine went up to 2.0 with a BUN of 36.  Her son is giving her furosemide as needed for shortness of breath or a 2 pound weight gain. I instructed to hold furosemide unless she gain 3 pounds or has increased shortness of breath.   CKD (chronic kidney disease) stage 4, GFR 15-29 ml/min (HCC) Improved with an EGFR of 30 today   Anemia due to stage 4 chronic kidney disease ([place]) This is improved and is likely multifactorial.  Recent iron studies were normal.  I feel this is likely due to her chronic kidney disease as well as her malignancy and probable chronic microscopic hematuria.   Thrombocytopenia ([place]) Resolved.   Hyperkalemia Resolved.  Mild leukopenia No neutropenia but this is a decrease from previous.    [name] doing remarkably well and so we will continue supportive care.  I will see her back in 1 month with CBC and comprehensive metabolic profile.  The patient understands the plans discussed today and is in agreement with them.  She knows to contact our office if she develops concerns prior to her next appointment.   I provided 20  minutes of face-to-face time during this encounter and > 50% was spent counseling as documented under my assessment and plan.    Derwood Kaplan, MD  [facility] [address] [place] [address] Dept: [phone] Dept Fax: [phone]   No orders of the defined types were placed in this encounter.     CHIEF COMPLAINT:  CC: Fatigue and weakness with decreased appetite and patient with renal cell carcinoma  Current Treatment: Supportive care  HISTORY OF PRESENT ILLNESS:  Sherry Hess is a 88 y.o. female referred by Dr. Joie Bimler in August 2022 for the evaluation and treatment of left renal carcinoma.  This began when the patient presented to the emergency department in June due to weakness, acute renal failure and dehydration.  Initial BUN was 76 with a creatinine of 3.1.  CT chest, abdomen and pelvis revealed a large centrally necrotic mass lesion which appeared to arise from the upper pole of the left kidney, measuring approximately 16.9 x 12.5 cm in greatest AP and transverse dimensions and extends for approximately 24 cm in craniocaudal projection, consistent with renal cell carcinoma until proven otherwise.  She was admitted and received IV fluids with improvement in her BUN to 25 and creatinine to 1.3 at the time of discharge.  She was also administered IV iron and she continues oral supplement.  She was referred to Dr. Nila Nephew for outpatient evaluation, and she was deemed not to be a surgical candidate due  to her multiple comorbidities.  She has a medical history significant for degenerative disc disease, anemia, dementia, TIA and CVA, hypertension, congestive heart failure and syncope.  We did not feel she could tolerate aggressive chemotherapy or immunotherapy, so we have offered supportive care.  She received IV fluids in September, but otherwise has done remarkably well.  INTERVAL HISTORY:  [place] is here is  here for routine follow-up but had been seen last month for dehydration.  She was given IV fluids and Lasix was changed to as needed for increased shortness of breath or significant weight gain.  She had hyperkalemia last time at 5.2 but this is now normalized.  Her creatinine has improved from 2.0 to 1.6 with a BUN of 34.  Her white count is slightly decreased to 3.6 but she has a normal ANC.  Her hemoglobin has increased from 10.2 to 10.6.  She denies abdominal pain, nausea, vomiting, diarrhea or constipation.  She denies worsening dyspnea or chest pain.  She denies fevers or chills. She denies pain.  She has been eating better and her weight is up 3-1/2 pounds today, and I do not feel this is edema. REVIEW OF SYSTEMS:  Review of Systems  Constitutional:  Positive for fatigue. Negative for chills, fever and unexpected weight change.  HENT:   Negative for lump/mass, mouth sores and sore throat.   Respiratory:  Negative for cough. Shortness of breath: Intermittent.  Cardiovascular:  Negative for chest pain and leg swelling.  Gastrointestinal:  Negative for abdominal pain, constipation, diarrhea, nausea and vomiting.  Endocrine: Negative for hot flashes.  Genitourinary:  Negative for difficulty urinating, dysuria, frequency and hematuria.   Musculoskeletal:  Negative for arthralgias, back pain and myalgias.  Skin:  Negative for rash.  Neurological:  Negative for dizziness and headaches.  Hematological:  Negative for adenopathy. Does not bruise/bleed easily.  Psychiatric/Behavioral:  Negative for depression and sleep disturbance. The patient is not nervous/anxious.      VITALS:  There were no vitals taken for this visit.  Wt Readings from Last 3 Encounters:  11/12/21 123 lb (55.8 kg)  10/13/21 122 lb 9.6 oz (55.6 kg)  10/13/21 122 lb 9.6 oz (55.6 kg)    There is no height or weight on file to calculate BMI.  Performance status (ECOG): 3 - Symptomatic, >50% confined to bed  PHYSICAL EXAM:   Physical Exam Vitals and nursing note reviewed.  Constitutional:      General: [name] not in acute distress.    Appearance: Normal appearance. [name] underweight. [name] ill-appearing (Chronically ill-appearing).  HENT:     Head: Normocephalic and atraumatic.     Mouth/Throat:     Mouth: Mucous membranes are moist.     Pharynx: Oropharynx is clear. No oropharyngeal exudate or posterior oropharyngeal erythema.  Eyes:     General: No scleral icterus.    Extraocular Movements: Extraocular movements intact.     Conjunctiva/sclera: Conjunctivae normal.     Pupils: Pupils are equal, round, and reactive to light.  Cardiovascular:     Rate and Rhythm: Normal rate and regular rhythm.     Heart sounds: Normal heart sounds. No murmur heard.    No friction rub. No gallop.  Pulmonary:     Effort: Pulmonary effort is normal.     Breath sounds: Normal breath sounds. No wheezing, rhonchi or rales.  Abdominal:     General: There is no distension.     Palpations: Abdomen is soft. There is mass (Persistent  large mass in the left abdomen). There is no hepatomegaly.     Tenderness: There is no abdominal tenderness.  Musculoskeletal:        General: Normal range of motion.     Cervical back: Normal range of motion and neck supple. No tenderness.     Right lower leg: No edema.     Left lower leg: No edema.  Lymphadenopathy:     Cervical: No cervical adenopathy.     Upper Body:     Right upper body: No supraclavicular or axillary adenopathy.     Left upper body: No supraclavicular or axillary adenopathy.     Lower Body: No right inguinal adenopathy. No left inguinal adenopathy.  Skin:    General: Skin is warm and dry.     Coloration: Skin is not jaundiced.     Findings: No rash.  Neurological:     Mental Status: [name] alert and oriented to person, place, and time.     Cranial Nerves: No cranial nerve deficit.  Psychiatric:        Mood and Affect: Mood normal.        Behavior: Behavior  normal.        Thought Content: Thought content normal.    LABS:      Latest Ref Rng & Units 10/23/2021   12:00 AM 10/13/2021   12:00 AM 09/23/2021   12:00 AM  CBC  WBC  3.6     4.5     3.2      Hemoglobin 12.0 - 16.0 10.6     10.2     10.9      Hematocrit 36 - 46 34     33     35      Platelets 150 - 400 K/uL 271     116     201         This result is from an external source.      Latest Ref Rng & Units 10/23/2021   12:00 AM 10/13/2021   12:00 AM 09/23/2021   12:00 AM  CMP  BUN 4 - 21 34     36     33      Creatinine 0.5 - 1.1 1.6     2.0     1.9      Sodium 137 - 147 139     135     139      Potassium 3.5 - 5.1 mEq/L 4.5     5.2     4.4      Chloride 99 - 108 102     99     102      CO2 13 - _0 Calcium 8.7 - 10.7 8.4     8.5     8.9      Alkaline Phos 25 - 125 60     64     59      AST 13 - 35 _1 ALT 7 - 35 U/L _2 This result is from an external source.     Lab Results  Component Value Date   CEA1 2.4 03/10/2017   /  CEA  Date Value Ref Range  Status  03/10/2017 2.4 0.0 - 4.7 ng/mL Final    Comment:    (NOTE)       Roche ECLIA methodology       Nonsmokers  <3.9                                     Smokers     <5.6 Performed At: [facility] [place], [place] [id number] Lindon Romp MD WU:[id number]    No results found for: "PSA1" No results found for: "CAN199" No results found for: "CAN125"  No results found for: "TOTALPROTELP", "ALBUMINELP", "A1GS", "A2GS", "BETS", "BETA2SER", "GAMS", "MSPIKE", "SPEI" Lab Results  Component Value Date   TIBC 294 08/24/2021   TIBC 301 03/10/2017   FERRITIN 132 08/24/2021   FERRITIN 132 03/10/2017   IRONPCTSAT 16 08/24/2021   IRONPCTSAT 7 (L) 03/10/2017   Lab Results  Component Value Date   LDH 106 01/14/2021    STUDIES:  No results found.    HISTORY:   Past Medical History:  Diagnosis Date  . Anxiety   . Benign neoplasm  of rectum   . Benign neoplasm of sigmoid colon   . Chest pain in adult 12/27/2015  . CKD (chronic kidney disease) stage 4, GFR 15-29 ml/min (HCC) 07/02/2019  . CVA (cerebral vascular accident) ([place]) 03/08/2017  . Dehydration 01/14/2021  . Dyspepsia 03/09/2017  . Dyspepsia 03/09/2017  . Essential hypertension 11/20/2014  . Fatigue 12/10/2016  . High cholesterol   . HLD (hyperlipidemia) 03/09/2017  . Hx of transient ischemic attack (TIA) 10/18/2016  . Hyperkalemia 10/13/2021  . Hypertension   . Hypertension, renal disease, stage 1-4 or unspecified chronic kidney disease 07/02/2019  . Mixed hyperlipidemia 03/09/2017  . Normocytic anemia   . Occult blood positive stool 03/09/2017  . Paroxysmal atrial fibrillation ([place]) 02/23/2017  . Syncope 12/10/2016  . TIA (transient ischemic attack)   . Vascular dementia without behavioral disturbance ([place]) 07/02/2019    Past Surgical History:  Procedure Laterality Date  . ABDOMINAL HYSTERECTOMY    . COLONOSCOPY N/A 03/11/2017   Procedure: COLONOSCOPY;  Surgeon: Gatha Mayer, MD;  Location: [facility] ENDOSCOPY;  Service: Endoscopy;  Laterality: N/A;  . ESOPHAGOGASTRODUODENOSCOPY N/A 03/11/2017   Procedure: ESOPHAGOGASTRODUODENOSCOPY (EGD);  Surgeon: Gatha Mayer, MD;  Location[facility] ENDOSCOPY;  Service: Endoscopy;  Laterality: N/A;  . TONSILLECTOMY    . VESICOVAGINAL FISTULA CLOSURE W/ TAH      Family History  Problem Relation Age of Onset  . Cancer Father        lung  . Stroke Mother   . Cancer Brother        x2 bladder  . Dementia Brother   . Atrial fibrillation Son   . Ataxia Neg Hx   . Chorea Neg Hx   . Mental retardation Neg Hx   . Migraines Neg Hx   . Multiple sclerosis Neg Hx   . Neurofibromatosis Neg Hx   . Neuropathy Neg Hx   . Parkinsonism Neg Hx   . Seizures Neg Hx     Social History:  reports that she has never smoked. She has never used smokeless tobacco. She reports that she does not drink alcohol and does not use drugs.The  patient is accompanied by her son [name] today.  Allergies:  Allergies  Allergen Reactions  . Minoxidil     rash  . Norvasc [Amlodipine Besylate]  rash  . Clonidine Derivatives Rash    [name]    Current Medications: Current Outpatient Medications  Medication Sig Dispense Refill  . atorvastatin (LIPITOR) 20 MG tablet TAKE 1 TABLET BY MOUTH DAILY 90 tablet 0  . Calcium Citrate (CAL-CITRATE PO) Take 600 mg by mouth daily.    . carvedilol (COREG) 25 MG tablet TAKE 1 TABLET BY MOUTH 2 TIMES DAILY WITH A MEAL (Patient taking differently: Take 25 mg by mouth daily.) 180 tablet 1  . diltiazem (CARDIZEM CD) 180 MG 24 hr capsule TAKE 1 CAPSULE BY MOUTH DAILY (Patient taking differently: Take 180 mg by mouth daily.) 90 capsule 3  . donepezil (ARICEPT) 10 MG tablet TAKE 1 TABLET BY MOUTH DAILY AT BEDTIME (Patient taking differently: Take 20 mg by mouth. Taking 68m) 90 tablet 1  . Ferrous Sulfate ([employer] SLOW RELEASE IRON PO) Take 65 tablets by mouth in the morning and at bedtime. Taking 679m   . Fish Oil-Cholecalciferol (FISH OIL + D3) 1000-1000 MG-UNIT CAPS Take 1 capsule by mouth 2 (two) times daily.    . Marland KitchenLUoxetine (PROZAC) 10 MG capsule TAKE 1 CAPSULE BY MOUTH ONCE DAILY (Patient taking differently: Take 10 mg by mouth daily.) 90 capsule 1  . furosemide (LASIX) 20 MG tablet Take 1 tablet (20 mg total) by mouth daily. (Patient taking differently: Take by mouth daily as needed.) 90 tablet 3  . hydrALAZINE (APRESOLINE) 50 MG tablet TAKE 1 TABLET BY MOUTH 2 TIMES DAILY 180 tablet 1  . levothyroxine (SYNTHROID) 25 MCG tablet TAKE 1 TABLET BY MOUTH ONCE DAILY ON AN EMPTY STOMACH 30 MINUTES BEFORE BREAKFAST (Patient taking differently: Take 25 mcg by mouth daily before breakfast.) 90 tablet 3  . memantine (NAMENDA) 10 MG tablet TAKE 1 TABLET BY MOUTH 2 TIMES DAILY 180 tablet 1  . Multiple Vitamin (MULTIVITAMIN) tablet Take 1 tablet by mouth daily. Unknown strength    . omeprazole  (PRILOSEC) 20 MG capsule TAKE 1 CAPSULE BY MOUTH DAILY (Patient taking differently: Take 20 mg by mouth daily.) 90 capsule 3  . potassium chloride (KLOR-CON) 10 MEQ tablet Take 1 tablet (10 mEq total) by mouth 2 (two) times daily. 60 tablet 5  . sodium chloride 1 g tablet Take 1 g by mouth daily.     No current facility-administered medications for this visit.

## 2021-10-23 ENCOUNTER — Telehealth: Payer: Self-pay | Admitting: Oncology

## 2021-10-23 ENCOUNTER — Inpatient Hospital Stay: Payer: Medicare Other

## 2021-10-23 ENCOUNTER — Other Ambulatory Visit: Payer: Self-pay | Admitting: Oncology

## 2021-10-23 ENCOUNTER — Inpatient Hospital Stay: Payer: Medicare Other | Attending: Oncology | Admitting: Oncology

## 2021-10-23 ENCOUNTER — Other Ambulatory Visit: Payer: Self-pay

## 2021-10-23 ENCOUNTER — Other Ambulatory Visit: Payer: Self-pay | Admitting: Hematology and Oncology

## 2021-10-23 DIAGNOSIS — C642 Malignant neoplasm of left kidney, except renal pelvis: Secondary | ICD-10-CM

## 2021-10-23 DIAGNOSIS — C649 Malignant neoplasm of unspecified kidney, except renal pelvis: Secondary | ICD-10-CM | POA: Diagnosis not present

## 2021-10-23 DIAGNOSIS — D61818 Other pancytopenia: Secondary | ICD-10-CM

## 2021-10-23 LAB — BASIC METABOLIC PANEL
BUN: 34 — AB (ref 4–21)
CO2: 28 — AB (ref 13–22)
Chloride: 102 (ref 99–108)
Creatinine: 1.6 — AB (ref 0.5–1.1)
Glucose: 129
Potassium: 4.5 mEq/L (ref 3.5–5.1)
Sodium: 139 (ref 137–147)

## 2021-10-23 LAB — HEPATIC FUNCTION PANEL
ALT: 14 U/L (ref 7–35)
AST: 19 (ref 13–35)
Alkaline Phosphatase: 60 (ref 25–125)
Bilirubin, Total: 0.6

## 2021-10-23 LAB — COMPREHENSIVE METABOLIC PANEL
Albumin: 3.4 — AB (ref 3.5–5.0)
Calcium: 8.4 — AB (ref 8.7–10.7)

## 2021-10-23 LAB — CBC AND DIFFERENTIAL
HCT: 34 — AB (ref 36–46)
Hemoglobin: 10.6 — AB (ref 12.0–16.0)
Neutrophils Absolute: 2.48
Platelets: 271 10*3/uL (ref 150–400)
WBC: 3.6

## 2021-10-23 LAB — CBC
MCV: 81 (ref 81–99)
RBC: 4.15 (ref 3.87–5.11)

## 2021-10-23 NOTE — Telephone Encounter (Signed)
Patient has been scheduled for follow-up visit per 10/23/21 los. Pt given an appt calendar with date and time.

## 2021-10-27 ENCOUNTER — Telehealth: Payer: Medicare Other

## 2021-11-03 ENCOUNTER — Ambulatory Visit: Payer: Medicare Other

## 2021-11-03 DIAGNOSIS — E782 Mixed hyperlipidemia: Secondary | ICD-10-CM

## 2021-11-03 DIAGNOSIS — I129 Hypertensive chronic kidney disease with stage 1 through stage 4 chronic kidney disease, or unspecified chronic kidney disease: Secondary | ICD-10-CM

## 2021-11-03 DIAGNOSIS — I5032 Chronic diastolic (congestive) heart failure: Secondary | ICD-10-CM

## 2021-11-03 DIAGNOSIS — I482 Chronic atrial fibrillation, unspecified: Secondary | ICD-10-CM

## 2021-11-03 NOTE — Patient Instructions (Signed)
Visit Information   Goals Addressed   None    Patient Care Plan: CCM Pharmacy Care Plan     Problem Identified: Dementia, Lipids, AFib   Priority: High  Onset Date: 06/17/2021     Long-Range Goal: Disease State Management   Start Date: 06/17/2021  Expected End Date: 06/17/2022  Recent Progress: On track  Priority: High  Note:   Current Barriers:  Does not contact provider office for questions/concerns  Pharmacist Clinical Goal(s):  Patient will achieve adherence to monitoring guidelines and medication adherence to achieve therapeutic efficacy through collaboration with PharmD and provider.   Interventions: 1:1 collaboration with Cox, Elnita Maxwell, MD regarding development and update of comprehensive plan of care as evidenced by provider attestation and co-signature Inter-disciplinary care team collaboration (see longitudinal plan of care) Comprehensive medication review performed; medication list updated in electronic medical record  Hypertension (BP goal <140/90) BP Readings from Last 3 Encounters:  10/13/21 (!) 123/50  10/13/21 (!) 106/54  09/23/21 135/61  -Controlled -Current treatment: Carvedilol '25mg'$  Appropriate, Effective, Safe, Accessible Diltiazem '180mg'$  Appropriate, Effective, Safe, Accessible Hydralazine '50mg'$  Appropriate, Effective, Safe, Accessible -Medications previously tried: N/A  -Current home readings:   Jan 2023: 111/53 -Current dietary habits: "Tries to eat healthy" -Current exercise habits: None -Denies hypotensive/hypertensive symptoms -Educated on BP goals and benefits of medications for prevention of heart attack, stroke and kidney damage; -Counseled to monitor BP at home Daily, document, and provide log at future appointments June 2023: Not in [place] says patient taking 1/2 tablet Carvedilol, patient's son states she's taking as written. Removed note from list  Hyperlipidemia: (LDL goal < 100) The ASCVD Risk score (Arnett DK, et al., 2019) failed to  calculate for the following reasons:   The 2019 ASCVD risk score is only valid for ages 10 to 53   The patient has a prior MI or stroke diagnosis Lab Results  Component Value Date   CHOL 81 (L) 04/03/2021   CHOL 99 (L) 09/30/2020   CHOL 108 04/02/2020   Lab Results  Component Value Date   HDL 27 (L) 04/03/2021   HDL 33 (L) 09/30/2020   HDL 30 (L) 04/02/2020   Lab Results  Component Value Date   LDLCALC 42 04/03/2021   LDLCALC 52 09/30/2020   LDLCALC 61 04/02/2020   Lab Results  Component Value Date   TRIG 47 04/03/2021   TRIG 63 09/30/2020   TRIG 87 04/02/2020   Lab Results  Component Value Date   CHOLHDL 3.0 04/03/2021   CHOLHDL 3.0 09/30/2020   CHOLHDL 3.6 04/02/2020  No results found for: "LDLDIRECT" -Controlled -Current treatment: Atorvastatin '20mg'$  Appropriate, Effective, Safe, Accessible Fish Oil Query Appropriate, Query effective, Query Safe, Accessible -Medications previously tried: N/A  -Current dietary patterns: "Tries to eat healthy" -Current exercise habits: None -Educated on Cholesterol goals;  June 2023: At previous visit, recommended patient stop Fish Oil to PCP. Still on list, will defer  Atrial Fibrillation (Goal: prevent stroke and major bleeding) -Controlled -CHADSVASC: 4 -Current treatment: Rate control:  Carvedilol '25mg'$  Appropriate, Effective, Safe, Accessible Diltiazem '180mg'$  Appropriate, Effective, Safe, Accessible Hydralazine '50mg'$  Appropriate, Effective, Safe, Accessible Anticoagulation:  None (High Fall Risk) -Medications previously tried: Eliquis  -Home BP and HR readings: (See above)  -Counseled on importance of regular laboratory monitoring; -Recommended to continue current medication    Thyroid (Goal TSH: 0.4-5.0) Lab Results  Component Value Date   TSH 3.650 04/03/2021  -Controlled -Current treatment: Levothyroxine 20mg Appropriate, Effective, Safe, Accessible -Counseled to take medication on an  empty  stomach -Recommended to continue current medication   Patient Goals/Self-Care Activities Patient will:  - take medications as prescribed as evidenced by patient report and record review  Follow Up Plan: The patient has been provided with contact information for the care management team and has been advised to call with any health related questions or concerns.   CPP F/U October 2023  [place] Constable, Sherian Rein.D. - [phone]       Ms. Mcinnis was given information about Chronic Care Management services today including:  CCM service includes personalized support from designated clinical staff supervised by her physician, including individualized plan of care and coordination with other care providers 24/7 contact phone numbers for assistance for urgent and routine care needs. Standard insurance, coinsurance, copays and deductibles apply for chronic care management only during months in which we provide at least 20 minutes of these services. Most insurances cover these services at 100%, however patients may be responsible for any copay, coinsurance and/or deductible if applicable. This service may help you avoid the need for more expensive face-to-face services. Only one practitioner may furnish and bill the service in a calendar month. The patient may stop CCM services at any time (effective at the end of the month) by phone call to the office staff.  Patient agreed to services and verbal consent obtained.   The patient verbalized understanding of instructions, educational materials, and care plan provided today and DECLINED offer to receive copy of patient instructions, educational materials, and care plan.  The pharmacy team will reach out to the patient again over the next 30 days.   Lane Hacker, [place]

## 2021-11-03 NOTE — Progress Notes (Cosign Needed)
Chronic Care Management Pharmacy Note  11/03/2021 Name:  Sherry Hess MRN:  [id number] DOB:  [birth year]  Summary: -Pleasant 88 year old female presents for f/u CCM visit. She stays at home all day and has the TV on. She told me she likes to watch Talk Shows but she doesn't actually watch them, she just likes the noise and having people talking in the house. She lives with her son. Her other son, [name], helps take care of meds and such. He retired at 33 from a Management consultant at [employer]. Now enjoys spending time with his dogs, going on a hike with friends 1/week, and will also go to the zoo weekly for a walk. 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  Recommendations/Changes made from today's visit: -N/A  Subjective: Sherry Hess is an 88 y.o. year old female who is a primary patient of Cox, Kirsten, MD.  The CCM team was consulted for assistance with disease management and care coordination needs.    Engaged with patient by telephone for follow up visit in response to provider referral for pharmacy case management and/or care coordination services.   Consent to Services:  The patient was given the following information about Chronic Care Management services today, agreed to services, and gave verbal consent: 1. CCM service includes personalized support from designated clinical staff supervised by the primary care provider, including individualized plan of care and coordination with other care providers 2. 24/7 contact phone numbers for assistance for urgent and routine care needs. 3. Service will only be billed when office clinical staff spend 20 minutes or more in a month to coordinate care. 4. Only one practitioner may furnish and bill the service in a calendar month. 5.The patient may stop CCM services at any time (effective at the end of the month) by phone call to the office staff. 6. The patient will be responsible for cost sharing (co-pay) of up to 20% of the service fee  (after annual deductible is met). Patient agreed to services and consent obtained.  Patient Care Team: [name], MD as PCP - General (Family Medicine) Park Liter, MD as Consulting Physician (Cardiology) Madelon Lips, MD as Consulting Physician (Nephrology) Derwood Kaplan, MD as Consulting Physician (Oncology) Lane Hacker, [facility] (Pharmacist)  Recent office visits:  None   Recent consult visits:  09/23/21 (Oncology) Hosie Poisson MD. Seen for Malignant Neoplasm. No med changes.   Hospital visits:  None   Objective:  Lab Results  Component Value Date   CREATININE 1.6 (A) 10/23/2021   BUN 34 (A) 10/23/2021   EGFR 26 (L) 04/03/2021   GFRNONAA 23 (L) 07/09/2020   GFRAA 26 (L) 07/09/2020   NA 139 10/23/2021   K 4.5 10/23/2021   CALCIUM 8.4 (A) 10/23/2021   CO2 28 (A) 10/23/2021   GLUCOSE 105 (H) 04/03/2021    Lab Results  Component Value Date/Time   HGBA1C 5.2 03/09/2017 02:26 AM    Last diabetic Eye exam: No results found for: "HMDIABEYEEXA"  Last diabetic Foot exam: No results found for: "HMDIABFOOTEX"   Lab Results  Component Value Date   CHOL 81 (L) 04/03/2021   HDL 27 (L) 04/03/2021   LDLCALC 42 04/03/2021   TRIG 47 04/03/2021   CHOLHDL 3.0 04/03/2021       Latest Ref Rng & Units 10/23/2021   12:00 AM 10/13/2021   12:00 AM 09/23/2021   12:00 AM  Hepatic Function  Albumin 3.5 - 5.0 3.4  3.6     3.9      AST 13 - 35 '19     26     17      ' ALT 7 - 35 U/L '14     18     12      ' Alk Phosphatase 25 - 125 60     64     59         This result is from an external source.    Lab Results  Component Value Date/Time   TSH 3.650 04/03/2021 10:07 AM   TSH 3.900 09/30/2020 11:09 AM       Latest Ref Rng & Units 10/23/2021   12:00 AM 10/13/2021   12:00 AM 09/23/2021   12:00 AM  CBC  WBC  3.6     4.5     3.2      Hemoglobin 12.0 - 16.0 10.6     10.2     10.9      Hematocrit 36 - 46 34     33     35      Platelets 150 - 400 K/uL 271      116     201         This result is from an external source.    Lab Results  Component Value Date/Time   VD25OH 82.0 04/03/2021 10:07 AM   VD25OH 154.0 (H) 09/30/2020 11:09 AM    Clinical ASCVD: No  The ASCVD Risk score (Arnett DK, et al., 2019) failed to calculate for the following reasons:   The 2019 ASCVD risk score is only valid for ages 16 to 69   The patient has a prior MI or stroke diagnosis       07/31/2021    9:21 AM 09/30/2020   10:37 AM 04/02/2020    9:45 AM  Depression screen PHQ 2/9  Decreased Interest 0 0 0  Down, Depressed, Hopeless 0 0 0  PHQ - 2 Score 0 0 0     Other: (CHADS2VASc if Afib, MMRC or CAT for COPD, ACT, DEXA)  Social History   Tobacco Use  Smoking Status Never  Smokeless Tobacco Never   BP Readings from Last 3 Encounters:  10/13/21 (!) 123/50  10/13/21 (!) 106/54  09/23/21 135/61   Pulse Readings from Last 3 Encounters:  10/13/21 92  10/13/21 90  09/23/21 75   Wt Readings from Last 3 Encounters:  10/13/21 122 lb 9.6 oz (55.6 kg)  10/13/21 122 lb 9.6 oz (55.6 kg)  09/23/21 122 lb 12.8 oz (55.7 kg)   BMI Readings from Last 3 Encounters:  10/13/21 21.72 kg/m  10/13/21 21.72 kg/m  09/23/21 21.75 kg/m    Assessment/Interventions: Review of patient past medical history, allergies, medications, health status, including review of consultants reports, laboratory and other test data, was performed as part of comprehensive evaluation and provision of chronic care management services.   SDOH:  (Social Determinants of Health) assessments and interventions performed: Yes SDOH Interventions    Flowsheet Row Most Recent Value  SDOH Interventions   Financial Strain Interventions Intervention Not Indicated  Physical Activity Interventions Other (Comments)      SDOH Screenings   Alcohol Screen: Low Risk  (09/30/2020)   Alcohol Screen    Last Alcohol Screening Score (AUDIT): 0  Depression (PHQ2-9): Low Risk  (07/31/2021)   Depression  (PHQ2-9)    PHQ-2 Score: 0  Financial Resource Strain: Low Risk  (11/03/2021)   Overall  Financial Resource Strain (CARDIA)    Difficulty of Paying Living Expenses: Not hard at all  Food Insecurity: No Food Insecurity (10/05/2019)   Hunger Vital Sign    Worried About Running Out of Food in the Last Year: Never true    Ran Out of Food in the Last Year: Never true  Housing: Low Risk  (09/04/2019)   Housing    Last Housing Risk Score: 0  Physical Activity: Inactive (11/03/2021)   Exercise Vital Sign    Days of Exercise per Week: 0 days    Minutes of Exercise per Session: 0 min  Social Connections: Not on file  Stress: Not on file  Tobacco Use: Low Risk  (10/13/2021)   Patient History    Smoking Tobacco Use: Never    Smokeless Tobacco Use: Never    Passive Exposure: Not on file  Transportation Needs: No Transportation Needs (09/04/2019)   PRAPARE - Transportation    Lack of Transportation (Medical): No    Lack of Transportation (Non-Medical): No    CCM Care Plan  Allergies  Allergen Reactions   Minoxidil     rash   Norvasc [Amlodipine Besylate]     rash   Clonidine Derivatives Rash    [name]    Medications Reviewed Today     Reviewed by Lane Hacker, [facility] (Pharmacist) on 11/03/21 at [place] List Status: <None>   Medication Order Taking? Sig Documenting Provider Last Dose Status Informant  atorvastatin (LIPITOR) 20 MG tablet [id number]  TAKE 1 TABLET BY MOUTH DAILY Cox, Kirsten, MD  Active   Calcium Citrate (CAL-CITRATE PO) [id number] No Take 600 mg by mouth daily. [provider] Taking Active   carvedilol (COREG) 25 MG tablet [id number] No TAKE 1 TABLET BY MOUTH 2 TIMES DAILY WITH A MEAL  Patient taking differently: Take 25 mg by mouth daily.   Cox, Kirsten, MD Taking Active   diltiazem (CARDIZEM CD) 180 MG 24 hr capsule [id number] No TAKE 1 CAPSULE BY MOUTH DAILY  Patient taking differently: Take 180 mg by mouth daily.   Park Liter, MD  Taking Active   donepezil (ARICEPT) 10 MG tablet [id number]  TAKE 1 TABLET BY MOUTH DAILY AT BEDTIME  Patient taking differently: Take 20 mg by mouth. Taking 6m   Cox, Kirsten, MD  Active   Ferrous Sulfate ([employer] SLOW RELEASE IRON PO) [id number] Take 65 tablets by mouth in the morning and at bedtime. Taking 640mProvider, Historical, MD Taking Active   Fish Oil-Cholecalciferol (FISH OIL + D3) 1000-1000 MG-UNIT CAPS [id number] Take 1 capsule by mouth 2 (two) times daily. [provider] Taking Active Family Member  FLUoxetine (PROZAC) 10 MG capsule [id number] TAKE 1 CAPSULE BY MOUTH ONCE DAILY  Patient taking differently: Take 10 mg by mouth daily.   Cox, Kirsten, MD Taking Active   furosemide (LASIX) 20 MG tablet [id number] Take 1 tablet (20 mg total) by mouth daily.  Patient taking differently: Take by mouth daily as needed.   KrPark LiterMD Taking Expired 06/23/21 2359   hydrALAZINE (APRESOLINE) 50 MG tablet [id number] TAKE 1 TABLET BY MOUTH 2 TIMES DAILY DaMarge DuncansPA-C Taking Active   levothyroxine (SYNTHROID) 25 MCG tablet [id number] TAKE 1 TABLET BY MOUTH ONCE DAILY ON AN EMPTY STOMACH 30 MINUTES BEFORE BREAKFAST  Patient taking differently: Take 25 mcg by mouth daily before breakfast.   Cox, Kirsten, MD Taking Active   memantine (NAMENDA) 10 MG tablet  [id number] No TAKE 1 TABLET BY MOUTH 2 TIMES DAILY Marge Duncans, PA-C Taking Active   Multiple Vitamin (MULTIVITAMIN) tablet [id number] No Take 1 tablet by mouth daily. Unknown strength [provider] Taking Active   omeprazole (PRILOSEC) 20 MG capsule [id number] No TAKE 1 CAPSULE BY MOUTH DAILY  Patient taking differently: Take 20 mg by mouth daily.   Cox, Kirsten, MD Taking Active   potassium chloride (KLOR-CON) 10 MEQ tablet [id number] No Take 1 tablet (10 mEq total) by mouth 2 (two) times daily. Derwood Kaplan, MD Taking Active   sodium chloride 1 g tablet [id number] No Take 1 g by mouth daily.  [provider] Taking Active Self            Patient Active Problem List   Diagnosis Date Noted   Hyperkalemia 10/13/2021   Aortic atherosclerosis ([place]) 08/09/2021   Chronic pleural effusion 08/09/2021   Dependence on continuous supplemental oxygen 08/09/2021   Thrombocytopenia ([place]) 08/09/2021   Other pancytopenia ([place]) 07/27/2021    Class: Chronic   Vitamin D deficiency 04/03/2021   Acquired hypothyroidism 04/03/2021   Renal cancer ([place]) 03/20/2021   Dehydration 01/14/2021   Neutropenia ([place]) 01/14/2021    Class: Diagnosis of   Anemia due to stage 4 chronic kidney disease ([place]) 11/27/2020   Chronic diastolic heart failure ([place]) 11/27/2020   Chronic respiratory failure with hypoxia ([place]) 11/27/2020   TIA (transient ischemic attack)    Anxiety    Hypertensive kidney disease with chronic kidney disease stage IV ([place]) 07/02/2019   Atrial fibrillation ([place]) 07/02/2019   CKD (chronic kidney disease) stage 4, GFR 15-29 ml/min ([place]) 07/02/2019   Vascular dementia without behavioral disturbance ([place]) 07/02/2019   Mixed hyperlipidemia 03/09/2017   CVA (cerebral vascular accident) ([place]) 03/08/2017   Paroxysmal atrial fibrillation ([place]) 02/23/2017   Hx of transient ischemic attack (TIA) 10/18/2016    Immunization History  Administered Date(s) Administered   Fluad Quad(high Dose 65+) 03/11/2020, 04/03/2021   Influenza,inj,quad, With Preservative 02/09/2018   Influenza-Unspecified 02/14/2018   PFIZER Comirnaty(Gray Top)Covid-19 Tri-Sucrose Vaccine 09/30/2020   PFIZER(Purple Top)SARS-COV-2 Vaccination 06/02/2019, 06/23/2019, 02/19/2020   Pfizer Covid-19 Vaccine Bivalent Booster 32yr & up 04/03/2021   Pneumococcal Conjugate-13 01/04/2014   Pneumococcal Polysaccharide-23 05/17/2010   Tdap 05/27/2011    Conditions to be addressed/monitored:  Hypertension, Hyperlipidemia, and Atrial Fibrillation  Care Plan : [place] Updates made by KLane Hacker  RPH since 11/03/2021 12:00 AM     Problem: Dementia, Lipids, AFib   Priority: High  Onset Date: 06/17/2021     Long-Range Goal: Disease State Management   Start Date: 06/17/2021  Expected End Date: 06/17/2022  Recent Progress: On track  Priority: High  Note:   Current Barriers:  Does not contact provider office for questions/concerns  Pharmacist Clinical Goal(s):  Patient will achieve adherence to monitoring guidelines and medication adherence to achieve therapeutic efficacy through collaboration with PharmD and provider.   Interventions: 1:1 collaboration with CRochel Brome MD regarding development and update of comprehensive plan of care as evidenced by provider attestation and co-signature Inter-disciplinary care team collaboration (see longitudinal plan of care) Comprehensive medication review performed; medication list updated in electronic medical record  Hypertension (BP goal <140/90) BP Readings from Last 3 Encounters:  10/13/21 (!) 123/50  10/13/21 (!) 106/54  09/23/21 135/61  -Controlled -Current treatment: Carvedilol 242mAppropriate, Effective, Safe, Accessible Diltiazem 18073mppropriate, Effective, Safe, Accessible Hydralazine 56m40mpropriate, Effective, Safe, Accessible -Medications previously tried: N/A  -Current home  readings:   Jan 2023: 111/53 -Current dietary habits: "Tries to eat healthy" -Current exercise habits: None -Denies hypotensive/hypertensive symptoms -Educated on BP goals and benefits of medications for prevention of heart attack, stroke and kidney damage; -Counseled to monitor BP at home Daily, document, and provide log at future appointments June 2023: Not in [place] says patient taking 1/2 tablet Carvedilol, patient's son states she's taking as written. Removed note from list  Hyperlipidemia: (LDL goal < 100) The ASCVD Risk score (Arnett DK, et al., 2019) failed to calculate for the following reasons:   The 2019 ASCVD risk score is only  valid for ages 88 to 22   The patient has a prior MI or stroke diagnosis Lab Results  Component Value Date   CHOL 81 (L) 04/03/2021   CHOL 99 (L) 09/30/2020   CHOL 108 04/02/2020   Lab Results  Component Value Date   HDL 27 (L) 04/03/2021   HDL 33 (L) 09/30/2020   HDL 30 (L) 04/02/2020   Lab Results  Component Value Date   LDLCALC 42 04/03/2021   LDLCALC 52 09/30/2020   LDLCALC 61 04/02/2020   Lab Results  Component Value Date   TRIG 47 04/03/2021   TRIG 63 09/30/2020   TRIG 87 04/02/2020   Lab Results  Component Value Date   CHOLHDL 3.0 04/03/2021   CHOLHDL 3.0 09/30/2020   CHOLHDL 3.6 04/02/2020  No results found for: "LDLDIRECT" -Controlled -Current treatment: Atorvastatin 19m Appropriate, Effective, Safe, Accessible Fish Oil Query Appropriate, Query effective, Query Safe, Accessible -Medications previously tried: N/A  -Current dietary patterns: "Tries to eat healthy" -Current exercise habits: None -Educated on Cholesterol goals;  June 2023: At previous visit, recommended patient stop Fish Oil to PCP. Still on list, will defer  Atrial Fibrillation (Goal: prevent stroke and major bleeding) -Controlled -CHADSVASC: 4 -Current treatment: Rate control:  Carvedilol 266mAppropriate, Effective, Safe, Accessible Diltiazem 18027mppropriate, Effective, Safe, Accessible Hydralazine 58m36mpropriate, Effective, Safe, Accessible Anticoagulation:  None (High Fall Risk) -Medications previously tried: Eliquis  -Home BP and HR readings: (See above)  -Counseled on importance of regular laboratory monitoring; -Recommended to continue current medication    Thyroid (Goal TSH: 0.4-5.0) Lab Results  Component Value Date   TSH 3.650 04/03/2021  -Controlled -Current treatment: Levothyroxine 25mc27mpropriate, Effective, Safe, Accessible -Counseled to take medication on an empty stomach -Recommended to continue current medication   Patient Goals/Self-Care  Activities Patient will:  - take medications as prescribed as evidenced by patient report and record review  Follow Up Plan: The patient has been provided with contact information for the care management team and has been advised to call with any health related questions or concerns.   CPP F/U October 2023  [place] ConstablermSherian Rein [phone]        Medication Assistance: None required.  Patient affirms current coverage meets needs.  Compliance/Adherence/Medication fill history: Care Gaps: Last annual wellness visit?None noted    Star Rating Drugs:  Medication:                Last Fill:         Day Supply  None noted   [facility] [id number]: [phone] pill box? Yes Pt endorses 100% compliance  We discussed: Current pharmacy is preferred with insurance plan and patient is satisfied with pharmacy services Patient decided to: Continue current medication  management strategy  Care Plan and Follow Up Patient Decision:  Patient agrees to Care Plan and Follow-up.  Plan: The patient has been provided with contact information for the care management team and has been advised to call with any health related questions or concerns.   CPP F/U November 2023  [place] Constable, Sherian Rein.D. - [phone]

## 2021-11-12 ENCOUNTER — Ambulatory Visit (INDEPENDENT_AMBULATORY_CARE_PROVIDER_SITE_OTHER): Payer: Medicare Other | Admitting: Family Medicine

## 2021-11-12 VITALS — BP 100/64 | HR 72 | Temp 98.7°F | Resp 16 | Ht 61.0 in | Wt 123.0 lb

## 2021-11-12 DIAGNOSIS — N184 Chronic kidney disease, stage 4 (severe): Secondary | ICD-10-CM

## 2021-11-12 DIAGNOSIS — D61818 Other pancytopenia: Secondary | ICD-10-CM | POA: Diagnosis not present

## 2021-11-12 DIAGNOSIS — Z111 Encounter for screening for respiratory tuberculosis: Secondary | ICD-10-CM

## 2021-11-12 DIAGNOSIS — I700 Atherosclerosis of aorta: Secondary | ICD-10-CM | POA: Diagnosis not present

## 2021-11-12 DIAGNOSIS — J9611 Chronic respiratory failure with hypoxia: Secondary | ICD-10-CM

## 2021-11-12 DIAGNOSIS — C642 Malignant neoplasm of left kidney, except renal pelvis: Secondary | ICD-10-CM

## 2021-11-12 DIAGNOSIS — D631 Anemia in chronic kidney disease: Secondary | ICD-10-CM

## 2021-11-12 DIAGNOSIS — E039 Hypothyroidism, unspecified: Secondary | ICD-10-CM

## 2021-11-12 DIAGNOSIS — Z9981 Dependence on supplemental oxygen: Secondary | ICD-10-CM

## 2021-11-12 DIAGNOSIS — I129 Hypertensive chronic kidney disease with stage 1 through stage 4 chronic kidney disease, or unspecified chronic kidney disease: Secondary | ICD-10-CM

## 2021-11-12 DIAGNOSIS — F0150 Vascular dementia without behavioral disturbance: Secondary | ICD-10-CM

## 2021-11-12 DIAGNOSIS — I480 Paroxysmal atrial fibrillation: Secondary | ICD-10-CM

## 2021-11-12 DIAGNOSIS — E782 Mixed hyperlipidemia: Secondary | ICD-10-CM

## 2021-11-12 DIAGNOSIS — E559 Vitamin D deficiency, unspecified: Secondary | ICD-10-CM

## 2021-11-12 NOTE — Progress Notes (Signed)
Subjective:  Patient ID: Sherry Hess, female    DOB: November 12, 1930  Age: 88 y.o. MRN: [id number]  Chief Complaint  Patient presents with   Paperwork for assisted living    HPI Patient is an 88 year old white female with past medical history significant for hypothyroidism, vitamin D deficiency, chronic kidney disease stage IV, presumed renal cancer (nonsurgical mass), pancytopenia, atrial fibrillation, and hypertension, and vascular dementia. Medications reviewed and accurate. Patient is following with [facility], Dr. Hinton Rao. Patient is here with her son today to fill out FL2 and paperwork from [place]. Patient is unable to care for herself. She requires assistance with ADLs. Patient's dementia has progressed. Her sons have been caring for her and it is requiring too much.    Current Outpatient Medications on File Prior to Visit  Medication Sig Dispense Refill   atorvastatin (LIPITOR) 20 MG tablet TAKE 1 TABLET BY MOUTH DAILY 90 tablet 0   Calcium Citrate (CAL-CITRATE PO) Take 600 mg by mouth daily.     carvedilol (COREG) 25 MG tablet TAKE 1 TABLET BY MOUTH 2 TIMES DAILY WITH A MEAL (Patient taking differently: Take 25 mg by mouth daily.) 180 tablet 1   diltiazem (CARDIZEM CD) 180 MG 24 hr capsule TAKE 1 CAPSULE BY MOUTH DAILY (Patient taking differently: Take 180 mg by mouth daily.) 90 capsule 3   donepezil (ARICEPT) 10 MG tablet TAKE 1 TABLET BY MOUTH DAILY AT BEDTIME (Patient taking differently: Take 20 mg by mouth. Taking '20mg'$ ) 90 tablet 1   Ferrous Sulfate ([employer] SLOW RELEASE IRON PO) Take 65 tablets by mouth in the morning and at bedtime. Taking '65mg'$      Fish Oil-Cholecalciferol (FISH OIL + D3) 1000-1000 MG-UNIT CAPS Take 1 capsule by mouth 2 (two) times daily.     FLUoxetine (PROZAC) 10 MG capsule TAKE 1 CAPSULE BY MOUTH ONCE DAILY (Patient taking differently: Take 10 mg by mouth daily.) 90 capsule 1   hydrALAZINE (APRESOLINE) 50 MG tablet TAKE 1 TABLET BY MOUTH 2 TIMES DAILY  180 tablet 1   levothyroxine (SYNTHROID) 25 MCG tablet TAKE 1 TABLET BY MOUTH ONCE DAILY ON AN EMPTY STOMACH 30 MINUTES BEFORE BREAKFAST (Patient taking differently: Take 25 mcg by mouth daily before breakfast.) 90 tablet 3   memantine (NAMENDA) 10 MG tablet TAKE 1 TABLET BY MOUTH 2 TIMES DAILY 180 tablet 1   Multiple Vitamin (MULTIVITAMIN) tablet Take 1 tablet by mouth daily. Unknown strength     omeprazole (PRILOSEC) 20 MG capsule TAKE 1 CAPSULE BY MOUTH DAILY (Patient taking differently: Take 20 mg by mouth daily.) 90 capsule 3   potassium chloride (KLOR-CON) 10 MEQ tablet Take 1 tablet (10 mEq total) by mouth 2 (two) times daily. 60 tablet 5   sodium chloride 1 g tablet Take 1 g by mouth daily.     furosemide (LASIX) 20 MG tablet Take 1 tablet (20 mg total) by mouth daily. (Patient taking differently: Take by mouth daily as needed.) 90 tablet 3   No current facility-administered medications on file prior to visit.   Past Medical History:  Diagnosis Date   Anxiety    Benign neoplasm of rectum    Benign neoplasm of sigmoid colon    Chest pain in adult 12/27/2015   CKD (chronic kidney disease) stage 4, GFR 15-29 ml/min ([place]) 07/02/2019   CVA (cerebral vascular accident) ([place]) 03/08/2017   Dehydration 01/14/2021   Dyspepsia 03/09/2017   Dyspepsia 03/09/2017   Essential hypertension 11/20/2014   Fatigue 12/10/2016  High cholesterol    HLD (hyperlipidemia) 03/09/2017   Hx of transient ischemic attack (TIA) 10/18/2016   Hyperkalemia 10/13/2021   Hypertension    Hypertension, renal disease, stage 1-4 or unspecified chronic kidney disease 07/02/2019   Mixed hyperlipidemia 03/09/2017   Normocytic anemia    Occult blood positive stool 03/09/2017   Paroxysmal atrial fibrillation ([place]) 02/23/2017   Syncope 12/10/2016   TIA (transient ischemic attack)    Vascular dementia without behavioral disturbance ([place]) 07/02/2019   Past Surgical History:  Procedure Laterality Date   ABDOMINAL HYSTERECTOMY      COLONOSCOPY N/A 03/11/2017   Procedure: COLONOSCOPY;  Surgeon: Gatha Mayer, MD;  Location: [facility];  Service: Endoscopy;  Laterality: N/A;   ESOPHAGOGASTRODUODENOSCOPY N/A 03/11/2017   Procedure: ESOPHAGOGASTRODUODENOSCOPY (EGD);  Surgeon: Gatha Mayer, MD;  Location[facility] ENDOSCOPY;  Service: Endoscopy;  Laterality: N/A;   TONSILLECTOMY     VESICOVAGINAL FISTULA CLOSURE W/ TAH      Family History  Problem Relation Age of Onset   Cancer Father        lung   Stroke Mother    Cancer Brother        x2 bladder   Dementia Brother    Atrial fibrillation Son    Ataxia Neg Hx    Chorea Neg Hx    Mental retardation Neg Hx    Migraines Neg Hx    Multiple sclerosis Neg Hx    Neurofibromatosis Neg Hx    Neuropathy Neg Hx    Parkinsonism Neg Hx    Seizures Neg Hx    Social History   Socioeconomic History   Marital status: Widowed    Spouse name: Not on file   Number of children: 3   Years of education: Not on file   Highest education level: Not on file  Occupational History   Not on file  Tobacco Use   Smoking status: Never   Smokeless tobacco: Never  Vaping Use   Vaping Use: Never used  Substance and Sexual Activity   Alcohol use: No   Drug use: No   Sexual activity: Not on file  Other Topics Concern   Not on file  Social History Narrative   Not on file   Social Determinants of Health   Financial Resource Strain: Low Risk  (11/03/2021)   Overall Financial Resource Strain (CARDIA)    Difficulty of Paying Living Expenses: Not hard at all  Food Insecurity: No Food Insecurity (10/05/2019)   Hunger Vital Sign    Worried About Running Out of Food in the Last Year: Never true    [place] in the Last Year: Never true  Transportation Needs: No Transportation Needs (09/04/2019)   PRAPARE - Hydrologist (Medical): No    Lack of Transportation (Non-Medical): No  Physical Activity: Inactive (11/03/2021)   Exercise Vital Sign    Days  of Exercise per Week: 0 days    Minutes of Exercise per Session: 0 min  Stress: Not on file  Social Connections: Not on file    Review of Systems  Constitutional:  Negative for chills, fatigue and fever.  HENT:  Negative for congestion, ear pain and sore throat.   Respiratory:  Positive for shortness of breath (Patient on Oxygen). Negative for cough.   Cardiovascular:  Negative for chest pain and palpitations.  Gastrointestinal:  Negative for abdominal pain, constipation, diarrhea, nausea and vomiting.  Endocrine: Negative for polydipsia, polyphagia and polyuria.  Genitourinary:  Negative for difficulty urinating and dysuria.  Musculoskeletal:  Negative for arthralgias, back pain and myalgias.  Skin:  Negative for rash.  Neurological:  Negative for headaches.  Psychiatric/Behavioral:  Negative for dysphoric mood. The patient is not nervous/anxious.      Objective:  BP 100/64   Pulse 72   Temp 98.7 F (37.1 C)   Resp 16   Ht '5\' 1"'$  (1.549 m)   Wt 123 lb (55.8 kg)   SpO2 95% Comment: 3 L  BMI 23.24 kg/m      11/20/2021    1:17 PM 11/12/2021    3:06 PM 10/13/2021    4:52 PM  BP/Weight  Systolic BP 951 884 166  Diastolic BP 58 64 50  Wt. (Lbs) 122.8 123   BMI 21.75 kg/m2 23.24 kg/m2     Physical Exam Vitals reviewed.  Constitutional:      Appearance: [name] normal weight.     Comments: Disheveled.   Neck:     Vascular: No carotid bruit.  Cardiovascular:     Rate and Rhythm: Normal rate and regular rhythm.     Heart sounds: Normal heart sounds.  Pulmonary:     Effort: Pulmonary effort is normal. No respiratory distress.     Breath sounds: Normal breath sounds.  Abdominal:     General: Abdomen is flat. Bowel sounds are normal.     Palpations: Abdomen is soft.     Tenderness: There is no abdominal tenderness.  Neurological:     Mental Status: [name] alert.  Psychiatric:        Mood and Affect: Mood normal.        Behavior: Behavior normal.     Diabetic Foot  Exam - Simple   No data filed      Lab Results  Component Value Date   WBC 3.6 10/23/2021   HGB 10.6 (A) 10/23/2021   HCT 34 (A) 10/23/2021   PLT 271 10/23/2021   GLUCOSE 105 (H) 04/03/2021   CHOL 81 (L) 04/03/2021   TRIG 47 04/03/2021   HDL 27 (L) 04/03/2021   LDLCALC 42 04/03/2021   ALT 14 10/23/2021   AST 19 10/23/2021   NA 139 10/23/2021   K 4.5 10/23/2021   CL 102 10/23/2021   CREATININE 1.6 (A) 10/23/2021   BUN 34 (A) 10/23/2021   CO2 28 (A) 10/23/2021   TSH 3.650 04/03/2021   INR 1.1 01/26/2017   HGBA1C 5.2 03/09/2017      Assessment & Plan:   Problem List Items Addressed This Visit       Cardiovascular and Mediastinum   Paroxysmal atrial fibrillation ([place])    The current medical regimen is effective;  continue present plan and medications.       Aortic atherosclerosis (HCC)    The current medical regimen is effective;  continue present plan and medications.         Respiratory   Chronic respiratory failure with hypoxia (HCC)    Wears 1-2 L oxygen.        Endocrine   Acquired hypothyroidism    The current medical regimen is effective;  continue present plan and medications.         Nervous and Auditory   Vascular dementia without behavioral disturbance (HCC)    Worsening. Filled out FL2 form.  The current medical regimen is effective;  continue present plan and medications.         Genitourinary   Renal cancer ([place]) (Chronic)  Nonsurgical.  Management per specialist.        Hypertensive kidney disease with chronic kidney disease stage IV ([place])    Well controlled.  No changes to medicines.  Labs  Reviewed.        Hematopoietic and Hemostatic   Other pancytopenia ([place])    Improved.        Other   Anemia due to stage 4 chronic kidney disease ([place]) (Chronic)    Management per specialist.        Mixed hyperlipidemia    Well controlled.  No changes to medicines.        Dependence on continuous supplemental oxygen     Continue oxygen.      Vitamin D deficiency   Other Visit Diagnoses     Encounter for screening for respiratory tuberculosis    -  Primary   Relevant Orders   QuantiFERON-TB Gold Plus (Completed)     .  No orders of the defined types were placed in this encounter.   Orders Placed This Encounter  Procedures   QuantiFERON-TB Gold Plus     Follow-up: Return if symptoms worsen or fail to improve.  An After Visit Summary was printed and given to the patient.  [name], MD [facility] [phone]

## 2021-11-17 LAB — QUANTIFERON-TB GOLD PLUS
QuantiFERON Mitogen Value: >10 IU/mL
QuantiFERON Nil Value: 0.26 IU/mL
QuantiFERON TB1 Ag Value: 0.01 IU/mL
QuantiFERON TB2 Ag Value: 0.03 IU/mL
QuantiFERON-TB Gold Plus: NEGATIVE

## 2021-11-20 ENCOUNTER — Ambulatory Visit: Payer: Medicare Other | Admitting: Cardiology

## 2021-11-20 ENCOUNTER — Encounter: Payer: Self-pay | Admitting: Cardiology

## 2021-11-20 ENCOUNTER — Encounter: Payer: Self-pay | Admitting: Hematology and Oncology

## 2021-11-20 VITALS — BP 128/58 | HR 76 | Ht 63.0 in | Wt 122.8 lb

## 2021-11-20 DIAGNOSIS — F0150 Vascular dementia without behavioral disturbance: Secondary | ICD-10-CM

## 2021-11-20 DIAGNOSIS — I482 Chronic atrial fibrillation, unspecified: Secondary | ICD-10-CM

## 2021-11-20 DIAGNOSIS — N184 Chronic kidney disease, stage 4 (severe): Secondary | ICD-10-CM

## 2021-11-20 DIAGNOSIS — C642 Malignant neoplasm of left kidney, except renal pelvis: Secondary | ICD-10-CM

## 2021-11-20 DIAGNOSIS — E782 Mixed hyperlipidemia: Secondary | ICD-10-CM

## 2021-11-20 NOTE — Patient Instructions (Signed)
Medication Instructions:  Your physician recommends that you continue on your current medications as directed. Please refer to the Current Medication list given to you today.  *If you need a refill on your cardiac medications before your next appointment, please call your pharmacy*   Lab Work: None Ordered If you have labs (blood work) drawn today and your tests are completely normal, you will receive your results only by: [place] (if you have MyChart) OR A paper copy in the mail If you have any lab test that is abnormal or we need to change your treatment, we will call you to review the results.   Testing/Procedures: None Ordered   Follow-Up: At [facility], you and your health needs are our priority.  As part of our continuing mission to provide you with exceptional heart care, we have created designated Provider Care Teams.  These Care Teams include your primary Cardiologist (physician) and Advanced Practice Providers (APPs -  Physician Assistants and Nurse Practitioners) who all work together to provide you with the care you need, when you need it.  We recommend signing up for the patient portal called "MyChart".  Sign up information is provided on this After Visit Summary.  MyChart is used to connect with patients for Virtual Visits (Telemedicine).  Patients are able to view lab/test results, encounter notes, upcoming appointments, etc.  Non-urgent messages can be sent to your provider as well.   To learn more about what you can do with MyChart, go to [email or web address].    Your next appointment:   1 year  The format for your next appointment:   In Person  Provider:   [name], MD    Other Instructions NA

## 2021-11-20 NOTE — Progress Notes (Unsigned)
Cardiology Office Note:    Date:  11/20/2021   ID:  Sherry Hess, DOB 1930-07-25, MRN [id number]  PCP:  Sherry Brome, MD  Cardiologist:  Sherry Campus, MD    Referring MD: Sherry Brome, MD   No chief complaint on file.   History of Present Illness:    Sherry Hess is a 88 y.o. female  with past medical history significant for paroxysmal atrial fibrillation, essential hypertension, vascular dementia, dyslipidemia, renal cancer that was recently recognized.  She has been follow-up with me last time I seen her which was in November looked like she was in atrial fibrillation she also got some decompensated congestive heart failure which was managed with diuretics she was given initially diuretics and potassium improved somewhat.  Overall decision has been made because of her comorbidities and advanced age and fragility conservative approach being implemented. [name] coming today Sherry Hess for follow-up overall seems to be doing well.  She denies have any chest pain tightness squeezing pressure burning chest no palpitations dizziness.  He looks like she is a limb ischemia.  Again she does have renal cancer that this managed conservatively  Past Medical History:  Diagnosis Date   Anxiety    Benign neoplasm of rectum    Benign neoplasm of sigmoid colon    Chest pain in adult 12/27/2015   CKD (chronic kidney disease) stage 4, GFR 15-29 ml/min ([place]) 07/02/2019   CVA (cerebral vascular accident) ([place]) 03/08/2017   Dehydration 01/14/2021   Dyspepsia 03/09/2017   Dyspepsia 03/09/2017   Essential hypertension 11/20/2014   Fatigue 12/10/2016   High cholesterol    HLD (hyperlipidemia) 03/09/2017   Hx of transient ischemic attack (TIA) 10/18/2016   Hyperkalemia 10/13/2021   Hypertension    Hypertension, renal disease, stage 1-4 or unspecified chronic kidney disease 07/02/2019   Mixed hyperlipidemia 03/09/2017   Normocytic anemia    Occult blood positive stool 03/09/2017   Paroxysmal atrial  fibrillation ([place]) 02/23/2017   Syncope 12/10/2016   TIA (transient ischemic attack)    Vascular dementia without behavioral disturbance ([place]) 07/02/2019    Past Surgical History:  Procedure Laterality Date   ABDOMINAL HYSTERECTOMY     COLONOSCOPY N/A 03/11/2017   Procedure: COLONOSCOPY;  Surgeon: Gatha Mayer, MD;  Location: [facility];  Service: Endoscopy;  Laterality: N/A;   ESOPHAGOGASTRODUODENOSCOPY N/A 03/11/2017   Procedure: ESOPHAGOGASTRODUODENOSCOPY (EGD);  Surgeon: Gatha Mayer, MD;  Location[facility] ENDOSCOPY;  Service: Endoscopy;  Laterality: N/A;   TONSILLECTOMY     VESICOVAGINAL FISTULA CLOSURE W/ TAH      Current Medications: No outpatient medications have been marked as taking for the 11/20/21 encounter (Appointment) with Park Liter, MD.     Allergies:   Minoxidil, Norvasc [amlodipine besylate], and Clonidine derivatives   Social History   Socioeconomic History   Marital status: Widowed    Spouse name: Not on file   Number of children: 3   Years of education: Not on file   Highest education level: Not on file  Occupational History   Not on file  Tobacco Use   Smoking status: Never   Smokeless tobacco: Never  Vaping Use   Vaping Use: Never used  Substance and Sexual Activity   Alcohol use: No   Drug use: No   Sexual activity: Not on file  Other Topics Concern   Not on file  Social History Narrative   Not on file   Social Determinants of Health   Financial Resource Strain: Low  Risk  (11/03/2021)   Overall Financial Resource Strain (CARDIA)    Difficulty of Paying Living Expenses: Not hard at all  Food Insecurity: No Food Insecurity (10/05/2019)   Hunger Vital Sign    Worried About Running Out of Food in the Last Year: Never true    Ran Out of Food in the Last Year: Never true  Transportation Needs: No Transportation Needs (09/04/2019)   PRAPARE - Hydrologist (Medical): No    Lack of Transportation  (Non-Medical): No  Physical Activity: Inactive (11/03/2021)   Exercise Vital Sign    Days of Exercise per Week: 0 days    Minutes of Exercise per Session: 0 min  Stress: Not on file  Social Connections: Not on file     Family History: [name] family history includes Atrial fibrillation in her son; Cancer in her brother and father; Dementia in her brother; Stroke in her mother. There is no history of Ataxia, Chorea, Mental retardation, Migraines, Multiple sclerosis, Neurofibromatosis, Neuropathy, Parkinsonism, or Seizures. ROS:   Please see the history of present illness.    All 14 point review of systems negative except as described per history of present illness  EKGs/Labs/Other Studies Reviewed:      Recent Labs: 01/07/2021: NT-Pro BNP 11,561 04/03/2021: TSH 3.650 10/23/2021: ALT 14; BUN 34; Creatinine 1.6; Hemoglobin 10.6; Platelets 271; Potassium 4.5; Sodium 139  Recent Lipid Panel    Component Value Date/Time   CHOL 81 (L) 04/03/2021 1007   TRIG 47 04/03/2021 1007   HDL 27 (L) 04/03/2021 1007   CHOLHDL 3.0 04/03/2021 1007   CHOLHDL 3.3 03/09/2017 0226   VLDL 10 03/09/2017 0226   LDLCALC 42 04/03/2021 1007    Physical Exam:    VS:  There were no vitals taken for this visit.    Wt Readings from Last 3 Encounters:  11/12/21 123 lb (55.8 kg)  10/13/21 122 lb 9.6 oz (55.6 kg)  10/13/21 122 lb 9.6 oz (55.6 kg)     GEN:  Well nourished, well developed in no acute distress HEENT: Normal NECK: No JVD; No carotid bruits LYMPHATICS: No lymphadenopathy CARDIAC: RRR, no murmurs, no rubs, no gallops RESPIRATORY:  Clear to auscultation without rales, wheezing or rhonchi  ABDOMEN: Soft, non-tender, non-distended MUSCULOSKELETAL:  No edema; No deformity  SKIN: Warm and dry LOWER EXTREMITIES: no swelling NEUROLOGIC:  Alert and oriented x 3 PSYCHIATRIC:  Normal affect   ASSESSMENT:    1. Chronic atrial fibrillation (HCC)   2. Vascular dementia without behavioral  disturbance ([place])   3. Malignant neoplasm of left kidney ([place])   4. CKD (chronic kidney disease) stage 4, GFR 15-29 ml/min (HCC)   5. Mixed hyperlipidemia    PLAN:    In order of problems listed above:  Chronic atrial fibrillation rate controlled she is not anticoagulated because of fragility Vascular dementia.  Present Renal cancer not surgically intervene secondary to advanced age and fragility Chronic kidney failure stable with creatinine 1.6 I did review K PN which also show me her LDL of 42 HDL 27 Mixed dyslipidemia laboratory data mentioned above, she is on Lipitor which I will continue   Medication Adjustments/Labs and Tests Ordered: Current medicines are reviewed at length with the patient today.  Concerns regarding medicines are outlined above.  No orders of the defined types were placed in this encounter.  Medication changes: No orders of the defined types were placed in this encounter.   Signed, Park Liter, MD, [facility]  11/20/2021 1:12 PM    [facility]

## 2021-11-22 ENCOUNTER — Encounter: Payer: Self-pay | Admitting: Family Medicine

## 2021-11-22 NOTE — Assessment & Plan Note (Signed)
Worsening. Filled out FL2 form.  The current medical regimen is effective;  continue present plan and medications.

## 2021-11-22 NOTE — Assessment & Plan Note (Signed)
Improved

## 2021-11-22 NOTE — Assessment & Plan Note (Signed)
Well controlled.  No changes to medicines.  Labs  Reviewed.

## 2021-11-22 NOTE — Assessment & Plan Note (Signed)
The current medical regimen is effective;  continue present plan and medications.  

## 2021-11-22 NOTE — Assessment & Plan Note (Signed)
Management per specialist. 

## 2021-11-22 NOTE — Assessment & Plan Note (Signed)
Nonsurgical.  Management per specialist.

## 2021-11-22 NOTE — Assessment & Plan Note (Addendum)
Continue oxygen. 

## 2021-11-22 NOTE — Assessment & Plan Note (Signed)
Well controlled.  No changes to medicines.  .   

## 2021-11-22 NOTE — Assessment & Plan Note (Signed)
Wears 1-2 L oxygen.

## 2021-11-25 ENCOUNTER — Telehealth: Payer: Self-pay

## 2021-11-25 NOTE — Progress Notes (Signed)
[facility]  [address] [place],  [place]  [address] [phone]  Clinic Day:  10/23/21  Referring physician: [name], MD  ASSESSMENT & PLAN:   Assessment & Plan: Renal cancer [facility]) Left renal carcinoma diagnosed in June 2022, with a mass measuring approximately 16.9 x 12.5 cm in greatest AP and transverse dimensions, and this extends for approximately 24 cm in craniocaudad projection.  We have no tissue biopsy.  Unfortunately with her age and multiple comorbidities, including dementia, she is not a candidate for surgical resection or aggressive systemic chemotherapy or immunotherapy. She and her son are not interested in aggressive therapy.  She has been doing remarkably well.    Dehydration [name] dehydrated again, but improved. She received IV fluids in September 2022 and again last month when her creatinine went up to 2.0 with a BUN of 36.  Her son is giving her furosemide as needed for shortness of breath or a 2 pound weight gain. I instructed to hold furosemide unless she gain 3 pounds or has increased shortness of breath.   CKD (chronic kidney disease) stage 4, GFR 15-29 ml/min (HCC) Improved with an EGFR of 30 today   Anemia due to stage 4 chronic kidney disease ([place]) This is improved and is likely multifactorial.  Recent iron studies were normal.  I feel this is likely due to her chronic kidney disease as well as her malignancy and probable chronic microscopic hematuria.   Thrombocytopenia ([place]) Resolved.   Hyperkalemia Resolved.  Mild leukopenia No neutropenia but this is a decrease from previous.    [name] doing remarkably well and so we will continue supportive care.  I will see her back in 1 month with CBC and comprehensive metabolic profile.  The patient understands the plans discussed today and is in agreement with them.  She knows to contact our office if she develops concerns prior to her next appointment.   I provided 20  minutes of face-to-face time during this encounter and > 50% was spent counseling as documented under my assessment and plan.    Derwood Kaplan, MD  [facility] [address] [place] [address] Dept: [phone] Dept Fax: [phone]   No orders of the defined types were placed in this encounter.     CHIEF COMPLAINT:  CC: Fatigue and weakness with decreased appetite and patient with renal cell carcinoma  Current Treatment: Supportive care  HISTORY OF PRESENT ILLNESS:  Sherry Hess is a 88 y.o. female referred by Dr. Joie Bimler in August 2022 for the evaluation and treatment of left renal carcinoma.  This began when the patient presented to the emergency department in June due to weakness, acute renal failure and dehydration.  Initial BUN was 76 with a creatinine of 3.1.  CT chest, abdomen and pelvis revealed a large centrally necrotic mass lesion which appeared to arise from the upper pole of the left kidney, measuring approximately 16.9 x 12.5 cm in greatest AP and transverse dimensions and extends for approximately 24 cm in craniocaudal projection, consistent with renal cell carcinoma until proven otherwise.  She was admitted and received IV fluids with improvement in her BUN to 25 and creatinine to 1.3 at the time of discharge.  She was also administered IV iron and she continues oral supplement.  She was referred to Dr. Nila Nephew for outpatient evaluation, and she was deemed not to be a surgical candidate due  to her multiple comorbidities.  She has a medical history significant for degenerative disc disease, anemia, dementia, TIA and CVA, hypertension, congestive heart failure and syncope.  We did not feel she could tolerate aggressive chemotherapy or immunotherapy, so we have offered supportive care.  She received IV fluids in September, but otherwise has done remarkably well.  INTERVAL HISTORY:  Sherry Hess is here is  here for routine follow-up but had been seen last month for dehydration.  She was given IV fluids and Lasix was changed to as needed for increased shortness of breath or significant weight gain.  She had hyperkalemia last time at 5.2 but this is now normalized.  Her creatinine has improved from 2.0 to 1.6 with a BUN of 34.  Her white count is slightly decreased to 3.6 but she has a normal ANC.  Her hemoglobin has increased from 10.2 to 10.6.  She denies abdominal pain, nausea, vomiting, diarrhea or constipation.  She denies worsening dyspnea or chest pain.  She denies fevers or chills. She denies pain.  She has been eating better and her weight is up 3-1/2 pounds today, and I do not feel this is edema. REVIEW OF SYSTEMS:  Review of Systems  Constitutional:  Positive for fatigue. Negative for chills, fever and unexpected weight change.  HENT:   Negative for lump/mass, mouth sores and sore throat.   Respiratory:  Negative for cough. Shortness of breath: Intermittent.  Cardiovascular:  Negative for chest pain and leg swelling.  Gastrointestinal:  Negative for abdominal pain, constipation, diarrhea, nausea and vomiting.  Endocrine: Negative for hot flashes.  Genitourinary:  Negative for difficulty urinating, dysuria, frequency and hematuria.   Musculoskeletal:  Negative for arthralgias, back pain and myalgias.  Skin:  Negative for rash.  Neurological:  Negative for dizziness and headaches.  Hematological:  Negative for adenopathy. Does not bruise/bleed easily.  Psychiatric/Behavioral:  Negative for depression and sleep disturbance. The patient is not nervous/anxious.      VITALS:  There were no vitals taken for this visit.  Wt Readings from Last 3 Encounters:  11/20/21 122 lb 12.8 oz (55.7 kg)  11/12/21 123 lb (55.8 kg)  10/13/21 122 lb 9.6 oz (55.6 kg)    There is no height or weight on file to calculate BMI.  Performance status (ECOG): 3 - Symptomatic, >50% confined to bed  PHYSICAL EXAM:   Physical Exam Vitals and nursing note reviewed.  Constitutional:      General: [name] not in acute distress.    Appearance: Normal appearance. [name] underweight. [name] ill-appearing (Chronically ill-appearing).  HENT:     Head: Normocephalic and atraumatic.     Mouth/Throat:     Mouth: Mucous membranes are moist.     Pharynx: Oropharynx is clear. No oropharyngeal exudate or posterior oropharyngeal erythema.  Eyes:     General: No scleral icterus.    Extraocular Movements: Extraocular movements intact.     Conjunctiva/sclera: Conjunctivae normal.     Pupils: Pupils are equal, round, and reactive to light.  Cardiovascular:     Rate and Rhythm: Normal rate and regular rhythm.     Heart sounds: Normal heart sounds. No murmur heard.    No friction rub. No gallop.  Pulmonary:     Effort: Pulmonary effort is normal.     Breath sounds: Normal breath sounds. No wheezing, rhonchi or rales.  Abdominal:     General: There is no distension.     Palpations: Abdomen is soft. There is mass (Persistent  large mass in the left abdomen). There is no hepatomegaly.     Tenderness: There is no abdominal tenderness.  Musculoskeletal:        General: Normal range of motion.     Cervical back: Normal range of motion and neck supple. No tenderness.     Right lower leg: No edema.     Left lower leg: No edema.  Lymphadenopathy:     Cervical: No cervical adenopathy.     Upper Body:     Right upper body: No supraclavicular or axillary adenopathy.     Left upper body: No supraclavicular or axillary adenopathy.     Lower Body: No right inguinal adenopathy. No left inguinal adenopathy.  Skin:    General: Skin is warm and dry.     Coloration: Skin is not jaundiced.     Findings: No rash.  Neurological:     Mental Status: [name] alert and oriented to person, place, and time.     Cranial Nerves: No cranial nerve deficit.  Psychiatric:        Mood and Affect: Mood normal.        Behavior: Behavior  normal.        Thought Content: Thought content normal.    LABS:      Latest Ref Rng & Units 10/23/2021   12:00 AM 10/13/2021   12:00 AM 09/23/2021   12:00 AM  CBC  WBC  3.6     4.5     3.2      Hemoglobin 12.0 - 16.0 10.6     10.2     10.9      Hematocrit 36 - 46 34     33     35      Platelets 150 - 400 K/uL 271     116     201         This result is from an external source.       Latest Ref Rng & Units 10/23/2021   12:00 AM 10/13/2021   12:00 AM 09/23/2021   12:00 AM  CMP  BUN 4 - 21 34     36     33      Creatinine 0.5 - 1.1 1.6     2.0     1.9      Sodium 137 - 147 139     135     139      Potassium 3.5 - 5.1 mEq/L 4.5     5.2     4.4      Chloride 99 - 108 102     99     102      CO2 13 - $Re'22 28     26     26      'skl$ Calcium 8.7 - 10.7 8.4     8.5     8.9      Alkaline Phos 25 - 125 60     64     59      AST 13 - 35 $Re'19     26     17      'ESZ$ ALT 7 - 35 U/L $Remo'14     18     12         'JcWVr$ This result is from an external source.      Lab Results  Component Value Date   CEA1 2.4 03/10/2017   /  CEA  Date Value  Ref Range Status  03/10/2017 2.4 0.0 - 4.7 ng/mL Final    Comment:    (NOTE)       Roche ECLIA methodology       Nonsmokers  <3.9                                     Smokers     <5.6 Performed At: [facility] [address] [place], [place] [id number] Mila Homer MD HZ:[id number]    No results found for: "PSA1" No results found for: "CAN199" No results found for: "CAN125"  No results found for: "TOTALPROTELP", "ALBUMINELP", "A1GS", "A2GS", "BETS", "BETA2SER", "GAMS", "MSPIKE", "SPEI" Lab Results  Component Value Date   TIBC 294 08/24/2021   TIBC 301 03/10/2017   FERRITIN 132 08/24/2021   FERRITIN 132 03/10/2017   IRONPCTSAT 16 08/24/2021   IRONPCTSAT 7 (L) 03/10/2017   Lab Results  Component Value Date   LDH 106 01/14/2021    STUDIES:  No results found.    HISTORY:   Past Medical History:  Diagnosis Date  . Anxiety   . Benign  neoplasm of rectum   . Benign neoplasm of sigmoid colon   . Chest pain in adult 12/27/2015  . CKD (chronic kidney disease) stage 4, GFR 15-29 ml/min (HCC) 07/02/2019  . CVA (cerebral vascular accident) (HCC) 03/08/2017  . Dehydration 01/14/2021  . Dyspepsia 03/09/2017  . Dyspepsia 03/09/2017  . Essential hypertension 11/20/2014  . Fatigue 12/10/2016  . High cholesterol   . HLD (hyperlipidemia) 03/09/2017  . Hx of transient ischemic attack (TIA) 10/18/2016  . Hyperkalemia 10/13/2021  . Hypertension   . Hypertension, renal disease, stage 1-4 or unspecified chronic kidney disease 07/02/2019  . Mixed hyperlipidemia 03/09/2017  . Normocytic anemia   . Occult blood positive stool 03/09/2017  . Paroxysmal atrial fibrillation (HCC) 02/23/2017  . Syncope 12/10/2016  . TIA (transient ischemic attack)   . Vascular dementia without behavioral disturbance (HCC) 07/02/2019    Past Surgical History:  Procedure Laterality Date  . ABDOMINAL HYSTERECTOMY    . COLONOSCOPY N/A 03/11/2017   Procedure: COLONOSCOPY;  Surgeon: Iva Boop, MD;  Location: [facility] ENDOSCOPY;  Service: Endoscopy;  Laterality: N/A;  . ESOPHAGOGASTRODUODENOSCOPY N/A 03/11/2017   Procedure: ESOPHAGOGASTRODUODENOSCOPY (EGD);  Surgeon: Iva Boop, MD;  Location: [facility] ENDOSCOPY;  Service: Endoscopy;  Laterality: N/A;  . TONSILLECTOMY    . VESICOVAGINAL FISTULA CLOSURE W/ TAH      Family History  Problem Relation Age of Onset  . Cancer Father        lung  . Stroke Mother   . Cancer Brother        x2 bladder  . Dementia Brother   . Atrial fibrillation Son   . Ataxia Neg Hx   . Chorea Neg Hx   . Mental retardation Neg Hx   . Migraines Neg Hx   . Multiple sclerosis Neg Hx   . Neurofibromatosis Neg Hx   . Neuropathy Neg Hx   . Parkinsonism Neg Hx   . Seizures Neg Hx     Social History:  reports that she has never smoked. She has never used smokeless tobacco. She reports that she does not drink alcohol and does not use  drugs.The patient is accompanied by her son Sherry Hess today.  Allergies:  Allergies  Allergen Reactions  . Minoxidil     rash  . Norvasc [Amlodipine Besylate]  rash  . Clonidine Derivatives Rash    [name]    Current Medications: Current Outpatient Medications  Medication Sig Dispense Refill  . atorvastatin (LIPITOR) 20 MG tablet TAKE 1 TABLET BY MOUTH DAILY 90 tablet 0  . Calcium Citrate (CAL-CITRATE PO) Take 600 mg by mouth daily.    . carvedilol (COREG) 25 MG tablet TAKE 1 TABLET BY MOUTH 2 TIMES DAILY WITH A MEAL (Patient taking differently: Take 25 mg by mouth daily.) 180 tablet 1  . diltiazem (CARDIZEM CD) 180 MG 24 hr capsule TAKE 1 CAPSULE BY MOUTH DAILY (Patient taking differently: Take 180 mg by mouth daily.) 90 capsule 3  . donepezil (ARICEPT) 10 MG tablet TAKE 1 TABLET BY MOUTH DAILY AT BEDTIME (Patient taking differently: Take 20 mg by mouth. Taking $RemoveBefor'20mg'fvFtQrECWIvP$ ) 90 tablet 1  . Ferrous Sulfate ([employer] SLOW RELEASE IRON PO) Take 65 tablets by mouth in the morning and at bedtime. Taking $RemoveBeforeD'65mg'QmtdOHoKwIsChw$     . Fish Oil-Cholecalciferol (FISH OIL + D3) 1000-1000 MG-UNIT CAPS Take 1 capsule by mouth 2 (two) times daily.    Marland Kitchen FLUoxetine (PROZAC) 10 MG capsule TAKE 1 CAPSULE BY MOUTH ONCE DAILY (Patient taking differently: Take 10 mg by mouth daily.) 90 capsule 1  . furosemide (LASIX) 20 MG tablet Take 1 tablet (20 mg total) by mouth daily. (Patient taking differently: Take by mouth daily as needed.) 90 tablet 3  . hydrALAZINE (APRESOLINE) 50 MG tablet TAKE 1 TABLET BY MOUTH 2 TIMES DAILY 180 tablet 1  . levothyroxine (SYNTHROID) 25 MCG tablet TAKE 1 TABLET BY MOUTH ONCE DAILY ON AN EMPTY STOMACH 30 MINUTES BEFORE BREAKFAST (Patient taking differently: Take 25 mcg by mouth daily before breakfast.) 90 tablet 3  . memantine (NAMENDA) 10 MG tablet TAKE 1 TABLET BY MOUTH 2 TIMES DAILY 180 tablet 1  . Multiple Vitamin (MULTIVITAMIN) tablet Take 1 tablet by mouth daily. Unknown strength    .  omeprazole (PRILOSEC) 20 MG capsule TAKE 1 CAPSULE BY MOUTH DAILY (Patient taking differently: Take 20 mg by mouth daily.) 90 capsule 3  . potassium chloride (KLOR-CON) 10 MEQ tablet Take 1 tablet (10 mEq total) by mouth 2 (two) times daily. 60 tablet 5  . sodium chloride 1 g tablet Take 1 g by mouth daily.     No current facility-administered medications for this visit.

## 2021-11-25 NOTE — Progress Notes (Signed)
Chronic Care Management Pharmacy Assistant   Name: Sherry Hess  MRN: [id number] DOB: 10-31-1930   Reason for Encounter: Disease State call for DM    Recent office visits:  11/12/21 Sherry Brome MD. Seen for screening for respiratory tuberculosis. No med changes.  Recent consult visits:  11/20/21 (Cardiology) Sherry Campus MD. Seen for Atrial Fibrillation. No med changes.  Hospital visits:  None  Medications: Outpatient Encounter Medications as of 11/25/2021  Medication Sig   atorvastatin (LIPITOR) 20 MG tablet TAKE 1 TABLET BY MOUTH DAILY   Calcium Citrate (CAL-CITRATE PO) Take 600 mg by mouth daily.   carvedilol (COREG) 25 MG tablet TAKE 1 TABLET BY MOUTH 2 TIMES DAILY WITH A MEAL (Patient taking differently: Take 25 mg by mouth daily.)   diltiazem (CARDIZEM CD) 180 MG 24 hr capsule TAKE 1 CAPSULE BY MOUTH DAILY (Patient taking differently: Take 180 mg by mouth daily.)   donepezil (ARICEPT) 10 MG tablet TAKE 1 TABLET BY MOUTH DAILY AT BEDTIME (Patient taking differently: Take 20 mg by mouth. Taking '20mg'$ )   Ferrous Sulfate ([employer]S SLOW RELEASE IRON PO) Take 65 tablets by mouth in the morning and at bedtime. Taking '65mg'$    Fish Oil-Cholecalciferol (FISH OIL + D3) 1000-1000 MG-UNIT CAPS Take 1 capsule by mouth 2 (two) times daily.   FLUoxetine (PROZAC) 10 MG capsule TAKE 1 CAPSULE BY MOUTH ONCE DAILY (Patient taking differently: Take 10 mg by mouth daily.)   furosemide (LASIX) 20 MG tablet Take 1 tablet (20 mg total) by mouth daily. (Patient taking differently: Take by mouth daily as needed.)   hydrALAZINE (APRESOLINE) 50 MG tablet TAKE 1 TABLET BY MOUTH 2 TIMES DAILY   levothyroxine (SYNTHROID) 25 MCG tablet TAKE 1 TABLET BY MOUTH ONCE DAILY ON AN EMPTY STOMACH 30 MINUTES BEFORE BREAKFAST (Patient taking differently: Take 25 mcg by mouth daily before breakfast.)   memantine (NAMENDA) 10 MG tablet TAKE 1 TABLET BY MOUTH 2 TIMES DAILY   Multiple Vitamin (MULTIVITAMIN) tablet Take  1 tablet by mouth daily. Unknown strength   omeprazole (PRILOSEC) 20 MG capsule TAKE 1 CAPSULE BY MOUTH DAILY (Patient taking differently: Take 20 mg by mouth daily.)   potassium chloride (KLOR-CON) 10 MEQ tablet Take 1 tablet (10 mEq total) by mouth 2 (two) times daily.   sodium chloride 1 g tablet Take 1 g by mouth daily.   No facility-administered encounter medications on file as of 11/25/2021.    Recent Relevant Labs: Lab Results  Component Value Date/Time   HGBA1C 5.2 03/09/2017 02:26 AM    Kidney Function Lab Results  Component Value Date/Time   CREATININE 1.6 (A) 10/23/2021 12:00 AM   CREATININE 2.0 (A) 10/13/2021 12:00 AM   CREATININE 1.84 (H) 04/03/2021 10:07 AM   CREATININE 1.89 (H) 01/07/2021 02:31 PM   GFRNONAA 23 (L) 07/09/2020 10:04 AM   GFRAA 26 (L) 07/09/2020 10:04 AM     Recent Office Vitals: BP Readings from Last 3 Encounters:  11/20/21 (!) 128/58  11/12/21 100/64  10/13/21 (!) 123/50   Pulse Readings from Last 3 Encounters:  11/20/21 76  11/12/21 72  10/13/21 92    Wt Readings from Last 3 Encounters:  11/20/21 122 lb 12.8 oz (55.7 kg)  11/12/21 123 lb (55.8 kg)  10/13/21 122 lb 9.6 oz (55.6 kg)     Kidney Function Lab Results  Component Value Date/Time   CREATININE 1.6 (A) 10/23/2021 12:00 AM   CREATININE 2.0 (A) 10/13/2021 12:00 AM   CREATININE 1.84 (H)  04/03/2021 10:07 AM   CREATININE 1.89 (H) 01/07/2021 02:31 PM   GFRNONAA 23 (L) 07/09/2020 10:04 AM   GFRAA 26 (L) 07/09/2020 10:04 AM       Latest Ref Rng & Units 10/23/2021   12:00 AM 10/13/2021   12:00 AM 09/23/2021   12:00 AM  BMP  BUN 4 - 21 34     36     33      Creatinine 0.5 - 1.1 1.6     2.0     1.9      Sodium 137 - 147 139     135     139      Potassium 3.5 - 5.1 mEq/L 4.5     5.2     4.4      Chloride 99 - 108 102     99     102      CO2 13 - '22 28     26     26      '$ Calcium 8.7 - 10.7 8.4     8.5     8.9         This result is from an external source.     Current  antihypertensive regimen:  Carvedilol '25mg'$  two times daily- Reported only taking 1 daily  Diltiazem '180mg'$  daily Hydralazine '50mg'$  two times daily  Patient verbally confirms she is taking the above medications as directed. Yes  How often are you checking your Blood Pressure? 1-2x per week  she checks her blood pressure in the morning before taking her medication.  Current home BP readings: 11/25/21  131/74  11/20/21 128/58  Wrist or arm cuff: Arm  Caffeine intake:1 cup of coffee Salt intake:Limited  OTC medications including pseudoephedrine or NSAIDs?  Any readings above 180/120? No  What recent interventions/DTPs have been made by any provider to improve Blood Pressure control since last CPP Visit: Pt denies any changes  Any recent hospitalizations or ED visits since last visit with CPP? No  What diet changes have been made to improve Blood Pressure Control?  No diet changes  What exercise is being done to improve your Blood Pressure Control?  Pt stated she is not exercising   Adherence Review: Is the patient currently on ACE/ARB medication? No Does the patient have >5 day gap between last estimated fill dates? CPP to review  Care Gaps: Last annual wellness visit? None noted   Star Rating Drugs:  Medication:  Last Fill: Day Supply  None noted    Sherry Hess, [place] Pharmacist Assistant  [phone]

## 2021-11-26 ENCOUNTER — Other Ambulatory Visit: Payer: Self-pay | Admitting: Oncology

## 2021-11-26 ENCOUNTER — Inpatient Hospital Stay: Payer: Medicare Other | Attending: Oncology

## 2021-11-26 ENCOUNTER — Inpatient Hospital Stay: Payer: Medicare Other | Admitting: Oncology

## 2021-11-26 VITALS — BP 130/56 | HR 71 | Temp 98.5°F | Resp 20 | Ht 63.0 in | Wt 122.0 lb

## 2021-11-26 DIAGNOSIS — E860 Dehydration: Secondary | ICD-10-CM | POA: Insufficient documentation

## 2021-11-26 DIAGNOSIS — C642 Malignant neoplasm of left kidney, except renal pelvis: Secondary | ICD-10-CM

## 2021-11-26 DIAGNOSIS — D631 Anemia in chronic kidney disease: Secondary | ICD-10-CM | POA: Insufficient documentation

## 2021-11-26 DIAGNOSIS — N184 Chronic kidney disease, stage 4 (severe): Secondary | ICD-10-CM | POA: Insufficient documentation

## 2021-11-26 LAB — COMPREHENSIVE METABOLIC PANEL
Albumin: 3.3 — AB (ref 3.5–5.0)
Calcium: 8.6 — AB (ref 8.7–10.7)

## 2021-11-26 LAB — BASIC METABOLIC PANEL
BUN: 28 — AB (ref 4–21)
CO2: 31 — AB (ref 13–22)
Chloride: 99 (ref 99–108)
Creatinine: 1.6 — AB (ref 0.5–1.1)
Glucose: 143
Potassium: 3.7 mEq/L (ref 3.5–5.1)
Sodium: 138 (ref 137–147)

## 2021-11-26 LAB — HEPATIC FUNCTION PANEL
ALT: 14 U/L (ref 7–35)
AST: 17 (ref 13–35)
Alkaline Phosphatase: 61 (ref 25–125)
Bilirubin, Total: 0.7

## 2021-11-26 LAB — CBC AND DIFFERENTIAL
HCT: 34 — AB (ref 36–46)
Hemoglobin: 10.9 — AB (ref 12.0–16.0)
Neutrophils Absolute: 2.24
Platelets: 125 10*3/uL — AB (ref 150–400)
WBC: 3.4

## 2021-11-26 LAB — CBC: RBC: 4.16 (ref 3.87–5.11)

## 2021-11-26 NOTE — Progress Notes (Signed)
[facility]  [address] [place],  [place]  [address] [phone]  Clinic Day:  11/26/21  Referring physician: [name], MD  ASSESSMENT & PLAN:   Assessment & Plan: Renal cancer [facility]) Left renal carcinoma diagnosed in June 2022, with a mass measuring approximately 16.9 x 12.5 cm in greatest AP and transverse dimensions, and this extends for approximately 24 cm in craniocaudad projection.  We have no tissue biopsy.  Unfortunately with her age and multiple comorbidities, including dementia, she is not a candidate for surgical resection or aggressive systemic chemotherapy or immunotherapy. She and her son are not interested in aggressive therapy.  She has been doing remarkably well.    Dehydration [name] dehydrated again, but improved. She received IV fluids in September 2022 and again last month when her creatinine went up to 2.0 with a BUN of 36.  Her son is giving her furosemide as needed for shortness of breath or a 2 pound weight gain. I instructed to hold furosemide unless she gain 3 pounds or has increased shortness of breath.   CKD (chronic kidney disease) stage 4, GFR 15-29 ml/min (HCC) Stable with a EGFR of 30 today.    Anemia due to stage 4 chronic kidney disease ([place]) This is improved and is likely multifactorial.  [name] on iron supplements.  I feel this is likely due to her chronic kidney disease as well as her malignancy and probable chronic microscopic hematuria.   Thrombocytopenia (HCC) Mild.   Mild leukopenia No neutropenia but this is a decrease from previous.   [name] doing remarkably well and so we will continue supportive care.  I will see her back in 1-2 months with CBC and comprehensive metabolic profile.  The patient understands the plans discussed today and is in agreement with them.  She knows to contact our office if she develops concerns prior to her next appointment.   I provided 20 minutes of face-to-face time  during this encounter and > 50% was spent counseling as documented under my assessment and plan.    Derwood Kaplan, MD  [place] [address] [place] [address] Dept: [phone] Dept Fax: [phone]   No orders of the defined types were placed in this encounter.     CHIEF COMPLAINT:  CC: Fatigue and weakness with decreased appetite and patient with renal cell carcinoma  Current Treatment: Supportive care  HISTORY OF PRESENT ILLNESS:  Sherry Hess is a 88 y.o. female referred by Dr. Joie Bimler in August 2022 for the evaluation and treatment of left renal carcinoma.  This began when the patient presented to the emergency department in June due to weakness, acute renal failure and dehydration.  Initial BUN was 76 with a creatinine of 3.1.  CT chest, abdomen and pelvis revealed a large centrally necrotic mass lesion which appeared to arise from the upper pole of the left kidney, measuring approximately 16.9 x 12.5 cm in greatest AP and transverse dimensions and extends for approximately 24 cm in craniocaudal projection, consistent with renal cell carcinoma until proven otherwise.  She was admitted and received IV fluids with improvement in her BUN to 25 and creatinine to 1.3 at the time of discharge.  She was also administered IV iron and she continues oral supplement.  She was referred to Dr. Nila Nephew for outpatient evaluation, and she was deemed not to be a surgical candidate due to her multiple  comorbidities.  She has a medical history significant for degenerative disc disease, anemia, dementia, TIA and CVA, hypertension, congestive heart failure and syncope.  We did not feel she could tolerate aggressive chemotherapy or immunotherapy, so we have offered supportive care.  She received IV fluids in September, but otherwise has done remarkably well.  INTERVAL HISTORY:  Sherry Hess is here is here for routine follow-up  and reports to feeling stable Her O2 stat was 93% but that is on 4 liters of oxygen. Her white blood count Is lower from last visit ata 3.4 not concerning. Her red cells are holding and has improved slightly since last visit. She continues to regularly take her iron pills. She reports have normal appetite and noticed she is at a stable weight since her last visit. Wt Readings from Last 3 Encounters:  11/26/21 122 lb (55.3 kg)  11/20/21 122 lb 12.8 oz (55.7 kg)  11/12/21 123 lb (55.8 kg)   Kidney functions are holding, her blood sugar is 143 which is normal, liver test are good, and protein is slightly low but not concerning. She denies abdominal pain, nausea, vomiting, diarrhea or constipation.  She denies chest pain.  She denies fevers or chills. She denies any worsening dyspnea and any blood in urine     REVIEW OF SYSTEMS:  Review of Systems  Constitutional:  Negative for chills, fever and unexpected weight change.  HENT:   Negative for lump/mass, mouth sores and sore throat.   Respiratory:  Positive for shortness of breath (Intermittent). Negative for cough.   Cardiovascular:  Negative for chest pain and leg swelling.  Gastrointestinal:  Negative for abdominal pain, constipation, diarrhea, nausea and vomiting.  Endocrine: Negative for hot flashes.  Genitourinary:  Negative for difficulty urinating, dysuria, frequency and hematuria.   Musculoskeletal:  Negative for arthralgias, back pain and myalgias.  Skin:  Negative for rash.  Neurological:  Negative for dizziness and headaches.  Hematological:  Negative for adenopathy. Does not bruise/bleed easily.  Psychiatric/Behavioral:  Negative for depression and sleep disturbance. The patient is not nervous/anxious.      VITALS:  Blood pressure (!) 130/56, pulse 71, temperature 98.5 F (36.9 C), temperature source Oral, resp. rate 20, height 5' 3" (1.6 m), weight 122 lb (55.3 kg), SpO2 93 %.  Wt Readings from Last 3 Encounters:  12/11/21 124  lb 1.6 oz (56.3 kg)  11/26/21 122 lb (55.3 kg)  11/20/21 122 lb 12.8 oz (55.7 kg)    BP Readings from Last 3 Encounters:  12/11/21 (!) 105/50  11/26/21 (!) 130/56  11/20/21 (!) 128/58     Body mass index is 21.61 kg/m.  Performance status (ECOG): 3 - Symptomatic, >50% confined to bed  PHYSICAL EXAM:  Physical Exam Vitals and nursing note reviewed.  Constitutional:      General: [name] not in acute distress.    Appearance: Normal appearance. [name] underweight. Ill appearance: Chronically ill-appearing.  HENT:     Head: Normocephalic and atraumatic.     Mouth/Throat:     Mouth: Mucous membranes are moist.     Pharynx: Oropharynx is clear. No oropharyngeal exudate or posterior oropharyngeal erythema.  Eyes:     General: No scleral icterus.    Extraocular Movements: Extraocular movements intact.     Conjunctiva/sclera: Conjunctivae normal.     Pupils: Pupils are equal, round, and reactive to light.  Cardiovascular:     Rate and Rhythm: Normal rate and regular rhythm.     Heart sounds: Normal heart sounds.  No murmur heard.    No friction rub. No gallop.  Pulmonary:     Effort: Pulmonary effort is normal.     Breath sounds: Normal breath sounds. No wheezing, rhonchi or rales.  Abdominal:     General: There is no distension.     Palpations: Abdomen is soft. There is mass (Persistent large mass in the left abdomen). There is no hepatomegaly.     Tenderness: There is no abdominal tenderness.  Musculoskeletal:        General: Normal range of motion.     Cervical back: Normal range of motion and neck supple. No tenderness.     Right lower leg: No edema.     Left lower leg: No edema.  Lymphadenopathy:     Cervical: No cervical adenopathy.     Upper Body:     Right upper body: No supraclavicular or axillary adenopathy.     Left upper body: No supraclavicular or axillary adenopathy.     Lower Body: No right inguinal adenopathy. No left inguinal adenopathy.  Skin:    General:  Skin is warm and dry.     Coloration: Skin is not jaundiced.     Findings: No rash.  Neurological:     Mental Status: [name] alert and oriented to person, place, and time.     Cranial Nerves: No cranial nerve deficit.  Psychiatric:        Mood and Affect: Mood normal.        Behavior: Behavior normal.        Thought Content: Thought content normal.     LABS:      Latest Ref Rng & Units 12/11/2021   12:00 AM 11/26/2021   12:00 AM 10/23/2021   12:00 AM  CBC  WBC  3.8     3.4     3.6      Hemoglobin 12.0 - 16.0 10.0     10.9     10.6      Hematocrit 36 - 46 32     34     34      Platelets 150 - 400 K/uL 148     125     271         This result is from an external source.      Latest Ref Rng & Units 12/11/2021   12:00 AM 11/26/2021   12:00 AM 10/23/2021   12:00 AM  CMP  BUN 4 - 21 39     28     34      Creatinine 0.5 - 1.1 2.0     1.6     1.6      Sodium 137 - 147 137     138     139      Potassium 3.5 - 5.1 mEq/L 4.7     3.7     4.5      Chloride 99 - 108 100     99     102      CO2 13 - _0 Calcium 8.7 - 10.7 8.8     8.6     8.4      Alkaline Phos 25 - 125 71     61     60      AST 13 - 35 50     17     19  ALT 7 - 35 U/L _0 This result is from an external source.     Lab Results  Component Value Date   CEA1 2.4 03/10/2017   /  CEA  Date Value Ref Range Status  03/10/2017 2.4 0.0 - 4.7 ng/mL Final    Comment:    (NOTE)       Roche ECLIA methodology       Nonsmokers  <3.9                                     Smokers     <5.6 Performed At: [facility] [place], [place] [id number] Lindon Romp MD WU:[id number]    No results found for: "PSA1" No results found for: "CAN199" No results found for: "CAN125"  No results found for: "TOTALPROTELP", "ALBUMINELP", "A1GS", "A2GS", "BETS", "BETA2SER", "GAMS", "MSPIKE", "SPEI" Lab Results  Component Value Date   TIBC 294 08/24/2021   TIBC 301  03/10/2017   FERRITIN 132 08/24/2021   FERRITIN 132 03/10/2017   IRONPCTSAT 16 08/24/2021   IRONPCTSAT 7 (L) 03/10/2017   Lab Results  Component Value Date   LDH 106 01/14/2021    STUDIES:  No results found.   Bone Density- Last completed 05/23/2020. Results showed she is osteoporotic. Repeat in 2 years.    HISTORY:   Past Medical History:  Diagnosis Date   Anxiety    Benign neoplasm of rectum    Benign neoplasm of sigmoid colon    Chest pain in adult 12/27/2015   CKD (chronic kidney disease) stage 4, GFR 15-29 ml/min ([place]) 07/02/2019   CVA (cerebral vascular accident) ([place]) 03/08/2017   Dehydration 01/14/2021   Dyspepsia 03/09/2017   Dyspepsia 03/09/2017   Essential hypertension 11/20/2014   Fatigue 12/10/2016   High cholesterol    HLD (hyperlipidemia) 03/09/2017   Hx of transient ischemic attack (TIA) 10/18/2016   Hyperkalemia 10/13/2021   Hypertension    Hypertension, renal disease, stage 1-4 or unspecified chronic kidney disease 07/02/2019   Mixed hyperlipidemia 03/09/2017   Normocytic anemia    Occult blood positive stool 03/09/2017   Paroxysmal atrial fibrillation ([place]) 02/23/2017   Syncope 12/10/2016   TIA (transient ischemic attack)    Vascular dementia without behavioral disturbance ([place]) 07/02/2019    Past Surgical History:  Procedure Laterality Date   ABDOMINAL HYSTERECTOMY     COLONOSCOPY N/A 03/11/2017   Procedure: COLONOSCOPY;  Surgeon: Gatha Mayer, MD;  Location: [facility];  Service: Endoscopy;  Laterality: N/A;   ESOPHAGOGASTRODUODENOSCOPY N/A 03/11/2017   Procedure: ESOPHAGOGASTRODUODENOSCOPY (EGD);  Surgeon: Gatha Mayer, MD;  Location: [facility] ENDOSCOPY;  Service: Endoscopy;  Laterality: N/A;   TONSILLECTOMY     VESICOVAGINAL FISTULA CLOSURE W/ TAH      Family History  Problem Relation Age of Onset   Cancer Father        lung   Stroke Mother    Cancer Brother        x2 bladder   Dementia Brother    Atrial fibrillation Son    Ataxia Neg Hx     Chorea Neg Hx    Mental retardation Neg Hx    Migraines Neg Hx    Multiple sclerosis Neg Hx    Neurofibromatosis Neg Hx    Neuropathy Neg Hx  Parkinsonism Neg Hx    Seizures Neg Hx     Social History:  reports that she has never smoked. She has never used smokeless tobacco. She reports that she does not drink alcohol and does not use drugs.The patient is accompanied by her son [name] today.  Allergies:  Allergies  Allergen Reactions   Minoxidil     rash   Norvasc [Amlodipine Besylate]     rash   Clonidine Derivatives Rash    [name]    Current Medications: Current Outpatient Medications  Medication Sig Dispense Refill   atorvastatin (LIPITOR) 20 MG tablet TAKE 1 TABLET BY MOUTH DAILY 90 tablet 0   Calcium Citrate (CAL-CITRATE PO) Take 600 mg by mouth daily.     carvedilol (COREG) 25 MG tablet TAKE 1 TABLET BY MOUTH 2 TIMES DAILY WITH A MEAL (Patient taking differently: Take 25 mg by mouth daily.) 180 tablet 1   diltiazem (CARDIZEM CD) 180 MG 24 hr capsule TAKE 1 CAPSULE BY MOUTH DAILY (Patient taking differently: Take 180 mg by mouth daily.) 90 capsule 3   donepezil (ARICEPT) 10 MG tablet TAKE 1 TABLET BY MOUTH DAILY AT BEDTIME (Patient taking differently: Take 20 mg by mouth. Taking 10m) 90 tablet 1   Ferrous Sulfate ([employer] SLOW RELEASE IRON PO) Take 65 tablets by mouth in the morning and at bedtime. Taking 663m    Fish Oil-Cholecalciferol (FISH OIL + D3) 1000-1000 MG-UNIT CAPS Take 1 capsule by mouth 2 (two) times daily.     FLUoxetine (PROZAC) 10 MG capsule TAKE 1 CAPSULE BY MOUTH ONCE DAILY 90 capsule 1   furosemide (LASIX) 20 MG tablet Take 1 tablet (20 mg total) by mouth daily. (Patient taking differently: Take by mouth daily as needed.) 90 tablet 3   hydrALAZINE (APRESOLINE) 50 MG tablet TAKE 1 TABLET BY MOUTH 2 TIMES DAILY 180 tablet 1   levothyroxine (SYNTHROID) 25 MCG tablet TAKE 1 TABLET BY MOUTH ONCE DAILY ON AN EMPTY STOMACH 30 MINUTES BEFORE  BREAKFAST 90 tablet 1   memantine (NAMENDA) 10 MG tablet TAKE 1 TABLET BY MOUTH 2 TIMES DAILY 180 tablet 1   Multiple Vitamin (MULTIVITAMIN) tablet Take 1 tablet by mouth daily. Unknown strength     omeprazole (PRILOSEC) 20 MG capsule TAKE 1 CAPSULE BY MOUTH DAILY (Patient taking differently: Take 20 mg by mouth daily.) 90 capsule 3   potassium chloride (KLOR-CON) 10 MEQ tablet Take 1 tablet (10 mEq total) by mouth 2 (two) times daily. 60 tablet 5   sodium chloride 1 g tablet Take 1 g by mouth daily.     No current facility-administered medications for this visit.    I,Gabriella Ballesteros,acting as a scribe for ChDerwood KaplanMD.,have documented all relevant documentation on the behalf of ChDerwood KaplanMD,as directed by  ChDerwood KaplanMD while in the presence of ChDerwood KaplanMD.

## 2021-12-01 ENCOUNTER — Ambulatory Visit: Payer: Medicare Other | Admitting: Family Medicine

## 2021-12-09 ENCOUNTER — Other Ambulatory Visit: Payer: Self-pay | Admitting: Family Medicine

## 2021-12-11 ENCOUNTER — Other Ambulatory Visit: Payer: Self-pay | Admitting: Oncology

## 2021-12-11 ENCOUNTER — Inpatient Hospital Stay: Payer: Medicare Other | Admitting: Oncology

## 2021-12-11 ENCOUNTER — Other Ambulatory Visit: Payer: Self-pay | Admitting: Hematology and Oncology

## 2021-12-11 ENCOUNTER — Inpatient Hospital Stay: Payer: Medicare Other

## 2021-12-11 VITALS — BP 105/50 | HR 66 | Temp 98.3°F | Resp 20 | Ht 63.0 in | Wt 124.1 lb

## 2021-12-11 DIAGNOSIS — N184 Chronic kidney disease, stage 4 (severe): Secondary | ICD-10-CM | POA: Diagnosis not present

## 2021-12-11 DIAGNOSIS — D509 Iron deficiency anemia, unspecified: Secondary | ICD-10-CM

## 2021-12-11 DIAGNOSIS — C642 Malignant neoplasm of left kidney, except renal pelvis: Secondary | ICD-10-CM

## 2021-12-11 DIAGNOSIS — D649 Anemia, unspecified: Secondary | ICD-10-CM | POA: Diagnosis not present

## 2021-12-11 DIAGNOSIS — D631 Anemia in chronic kidney disease: Secondary | ICD-10-CM | POA: Diagnosis not present

## 2021-12-11 DIAGNOSIS — D539 Nutritional anemia, unspecified: Secondary | ICD-10-CM | POA: Insufficient documentation

## 2021-12-11 DIAGNOSIS — E860 Dehydration: Secondary | ICD-10-CM

## 2021-12-11 LAB — BASIC METABOLIC PANEL
BUN: 39 — AB (ref 4–21)
CO2: 29 — AB (ref 13–22)
Chloride: 100 (ref 99–108)
Creatinine: 2 — AB (ref 0.5–1.1)
Glucose: 133
Potassium: 4.7 mEq/L (ref 3.5–5.1)
Sodium: 137 (ref 137–147)

## 2021-12-11 LAB — HEPATIC FUNCTION PANEL
ALT: 17 U/L (ref 7–35)
AST: 50 — AB (ref 13–35)
Alkaline Phosphatase: 71 (ref 25–125)
Bilirubin, Total: 0.7

## 2021-12-11 LAB — CBC AND DIFFERENTIAL
HCT: 32 — AB (ref 36–46)
Hemoglobin: 10 — AB (ref 12.0–16.0)
Neutrophils Absolute: 2.43
Platelets: 148 10*3/uL — AB (ref 150–400)
WBC: 3.8

## 2021-12-11 LAB — CBC: RBC: 3.87 (ref 3.87–5.11)

## 2021-12-11 LAB — COMPREHENSIVE METABOLIC PANEL
Albumin: 3.1 — AB (ref 3.5–5.0)
Calcium: 8.8 (ref 8.7–10.7)

## 2021-12-11 MED ORDER — SODIUM CHLORIDE 0.9% FLUSH
10.0000 mL | Freq: Once | INTRAVENOUS | Status: AC | PRN
  Administered 2021-12-11: 10 mL

## 2021-12-11 MED ORDER — SODIUM CHLORIDE 0.9 % IV SOLN: Freq: Once | INTRAVENOUS | Status: AC

## 2021-12-13 DIAGNOSIS — J90 Pleural effusion, not elsewhere classified: Secondary | ICD-10-CM | POA: Diagnosis not present

## 2021-12-13 DIAGNOSIS — E875 Hyperkalemia: Secondary | ICD-10-CM | POA: Diagnosis not present

## 2021-12-13 DIAGNOSIS — I959 Hypotension, unspecified: Secondary | ICD-10-CM | POA: Diagnosis not present

## 2021-12-13 DIAGNOSIS — R531 Weakness: Secondary | ICD-10-CM | POA: Diagnosis not present

## 2021-12-14 DIAGNOSIS — J90 Pleural effusion, not elsewhere classified: Secondary | ICD-10-CM | POA: Diagnosis not present

## 2021-12-14 DIAGNOSIS — N184 Chronic kidney disease, stage 4 (severe): Secondary | ICD-10-CM | POA: Diagnosis not present

## 2021-12-14 DIAGNOSIS — J9811 Atelectasis: Secondary | ICD-10-CM | POA: Diagnosis not present

## 2021-12-14 DIAGNOSIS — Z9981 Dependence on supplemental oxygen: Secondary | ICD-10-CM | POA: Diagnosis not present

## 2021-12-14 DIAGNOSIS — J189 Pneumonia, unspecified organism: Secondary | ICD-10-CM | POA: Diagnosis not present

## 2021-12-14 DIAGNOSIS — J811 Chronic pulmonary edema: Secondary | ICD-10-CM | POA: Diagnosis not present

## 2021-12-14 DIAGNOSIS — R109 Unspecified abdominal pain: Secondary | ICD-10-CM | POA: Diagnosis not present

## 2021-12-14 DIAGNOSIS — K219 Gastro-esophageal reflux disease without esophagitis: Secondary | ICD-10-CM | POA: Diagnosis not present

## 2021-12-14 DIAGNOSIS — M1990 Unspecified osteoarthritis, unspecified site: Secondary | ICD-10-CM | POA: Diagnosis not present

## 2021-12-14 DIAGNOSIS — C642 Malignant neoplasm of left kidney, except renal pelvis: Secondary | ICD-10-CM | POA: Diagnosis not present

## 2021-12-14 DIAGNOSIS — D509 Iron deficiency anemia, unspecified: Secondary | ICD-10-CM | POA: Diagnosis not present

## 2021-12-14 DIAGNOSIS — N179 Acute kidney failure, unspecified: Secondary | ICD-10-CM | POA: Diagnosis not present

## 2021-12-14 DIAGNOSIS — E860 Dehydration: Secondary | ICD-10-CM | POA: Diagnosis not present

## 2021-12-14 DIAGNOSIS — M8448XA Pathological fracture, other site, initial encounter for fracture: Secondary | ICD-10-CM | POA: Diagnosis not present

## 2021-12-14 DIAGNOSIS — I4891 Unspecified atrial fibrillation: Secondary | ICD-10-CM | POA: Diagnosis not present

## 2021-12-14 DIAGNOSIS — E875 Hyperkalemia: Secondary | ICD-10-CM | POA: Diagnosis not present

## 2021-12-14 DIAGNOSIS — I959 Hypotension, unspecified: Secondary | ICD-10-CM | POA: Diagnosis not present

## 2021-12-14 DIAGNOSIS — R531 Weakness: Secondary | ICD-10-CM | POA: Diagnosis not present

## 2021-12-14 DIAGNOSIS — I129 Hypertensive chronic kidney disease with stage 1 through stage 4 chronic kidney disease, or unspecified chronic kidney disease: Secondary | ICD-10-CM | POA: Diagnosis not present

## 2021-12-14 DIAGNOSIS — J9611 Chronic respiratory failure with hypoxia: Secondary | ICD-10-CM | POA: Diagnosis not present

## 2021-12-14 DIAGNOSIS — D631 Anemia in chronic kidney disease: Secondary | ICD-10-CM | POA: Diagnosis not present

## 2021-12-15 ENCOUNTER — Encounter: Payer: Self-pay | Admitting: Oncology

## 2021-12-15 DIAGNOSIS — D509 Iron deficiency anemia, unspecified: Secondary | ICD-10-CM | POA: Insufficient documentation

## 2021-12-15 DIAGNOSIS — C642 Malignant neoplasm of left kidney, except renal pelvis: Secondary | ICD-10-CM

## 2021-12-15 NOTE — Addendum Note (Signed)
Addended by: Juanetta Beets on: 12/15/2021 09:03 AM   Modules accepted: Orders

## 2021-12-16 DIAGNOSIS — C642 Malignant neoplasm of left kidney, except renal pelvis: Secondary | ICD-10-CM | POA: Diagnosis not present

## 2021-12-17 ENCOUNTER — Ambulatory Visit: Payer: Medicare Other

## 2021-12-17 ENCOUNTER — Other Ambulatory Visit: Payer: Self-pay | Admitting: Oncology

## 2021-12-21 ENCOUNTER — Ambulatory Visit: Payer: Medicare Other

## 2021-12-21 ENCOUNTER — Other Ambulatory Visit: Payer: Self-pay | Admitting: Pharmacist

## 2021-12-22 ENCOUNTER — Ambulatory Visit: Payer: Medicare Other

## 2021-12-23 ENCOUNTER — Ambulatory Visit: Payer: Medicare Other

## 2021-12-24 ENCOUNTER — Ambulatory Visit: Payer: Medicare Other

## 2021-12-25 ENCOUNTER — Ambulatory Visit: Payer: Medicare Other | Admitting: Hematology and Oncology

## 2021-12-25 ENCOUNTER — Ambulatory Visit: Payer: Medicare Other

## 2021-12-25 ENCOUNTER — Other Ambulatory Visit: Payer: Medicare Other

## 2021-12-27 ENCOUNTER — Encounter: Payer: Self-pay | Admitting: Oncology

## 2021-12-27 ENCOUNTER — Encounter: Payer: Self-pay | Admitting: Family Medicine

## 2022-01-05 ENCOUNTER — Encounter: Payer: Self-pay | Admitting: Oncology

## 2022-01-05 NOTE — Progress Notes (Signed)
[place]  [address] [place],  [place]  [address] [phone]  Clinic Day:  11/26/21  Referring physician: [name], MD  ASSESSMENT & PLAN:   Assessment & Plan: Renal cancer [facility]) Left renal carcinoma diagnosed in June 2022, with a mass measuring approximately 16.9 x 12.5 cm in greatest AP and transverse dimensions, and this extends for approximately 24 cm in craniocaudad projection.  We have no tissue biopsy.  Unfortunately with her age and multiple comorbidities, including dementia, she is not a candidate for surgical resection or aggressive systemic chemotherapy or immunotherapy. She and her son are not interested in aggressive therapy.     Dehydration [name] dehydrated again.  Her creatinine went up to 2.0 with a BUN of 39.  Her son is giving her furosemide as needed for shortness of breath or a 2 pound weight gain.  She appears dehydrated to me today and so we will arrange for IV fluids of 1 L of normal saline to be given slowly.  CKD (chronic kidney disease) stage 4, GFR 15-29 ml/min (HCC) Stable with a EGFR of 30 today.    Anemia due to stage 4 chronic kidney disease ([place]) This is improved and is likely multifactorial.  [name] on iron supplements.  I feel this is likely due to her chronic kidney disease as well as her malignancy and probable chronic microscopic hematuria.  Her hemoglobin has dropped from 10.9 to 10.9.   Thrombocytopenia (HCC) Her platelet count is back to normal today  Mild leukopenia Her white count has come back up to normal at 3.8.    We will continue supportive care.  I will schedule her for IV fluids with 1 L of normal saline.  We will evaluate her iron's studies but I think she may need IV iron soon also.  She will need closer follow-up and so I will see her back in 2 weeks with CBC and comprehensive metabolic profile.  The patient understands the plans discussed today and is in agreement with them.  She knows  to contact our office if she develops concerns prior to her next appointment.   I provided 20 minutes of face-to-face time during this encounter and > 50% was spent counseling as documented under my assessment and plan.    Derwood Kaplan, MD  [facility] AT [facility] [address] [place] [address] Dept: [phone] Dept Fax: [phone]   Orders Placed This Encounter  Procedures   CBC and differential    This external order was created through the Results Console.   CBC    This external order was created through the Results Console.   Basic metabolic panel    This external order was created through the Results Console.   Comprehensive metabolic panel    This external order was created through the Results Console.   Hepatic function panel    This external order was created through the Results Console.       CHIEF COMPLAINT:  CC: Fatigue and weakness with decreased appetite and patient with renal cell carcinoma  Current Treatment: Supportive care  HISTORY OF PRESENT ILLNESS:  Sherry Hess is a 88 y.o. female referred by Dr. Joie Bimler in August 2022 for the evaluation and treatment of left renal carcinoma.  This began when the patient presented to the emergency department in June due to weakness, acute renal failure and dehydration.  Initial BUN was 76 with  a creatinine of 3.1.  CT chest, abdomen and pelvis revealed a large centrally necrotic mass lesion which appeared to arise from the upper pole of the left kidney, measuring approximately 16.9 x 12.5 cm in greatest AP and transverse dimensions and extends for approximately 24 cm in craniocaudal projection, consistent with renal cell carcinoma until proven otherwise.  She was admitted and received IV fluids with improvement in her BUN to 25 and creatinine to 1.3 at the time of discharge.  She was also administered IV iron and she continues oral supplement.  She  was referred to Dr. Nila Nephew for outpatient evaluation, and she was deemed not to be a surgical candidate due to her multiple comorbidities.  She has a medical history significant for degenerative disc disease, anemia, dementia, TIA and CVA, hypertension, congestive heart failure and syncope.  We did not feel she could tolerate aggressive chemotherapy or immunotherapy, so we have offered supportive care.  She received IV fluids in September, but otherwise has done remarkably well.  INTERVAL HISTORY:  Sherry Hess is here is here for routine follow-up and reports to feeling stable Her O2 stat was 93% but that is on 4 liters of oxygen. Her white blood count Is lower from last visit ata 3.4 not concerning. Her red cells are holding and has improved slightly since last visit. She continues to regularly take her iron pills. She reports have normal appetite and noticed she is at a stable weight since her last visit.  [name] clearly not doing as well as usual Wt Readings from Last 3 Encounters:  11/26/21 122 lb (55.3 kg)  11/20/21 122 lb 12.8 oz (55.7 kg)  11/12/21 123 lb (55.8 kg)   Kidney functions are holding, her blood sugar is 143 which is normal, liver test are good, and protein is slightly low but not concerning. She denies abdominal pain, nausea, vomiting, diarrhea or constipation.  She denies chest pain.  She denies fevers or chills. She denies any worsening dyspnea and any blood in urine     REVIEW OF SYSTEMS:  Review of Systems  Constitutional:  Positive for appetite change and fatigue. Negative for chills, fever and unexpected weight change.  HENT:   Negative for lump/mass, mouth sores and sore throat.   Respiratory:  Positive for shortness of breath (Intermittent). Negative for cough.   Cardiovascular:  Negative for chest pain and leg swelling.  Gastrointestinal:  Negative for abdominal pain, constipation, diarrhea, nausea and vomiting.  Endocrine: Negative for hot flashes.  Genitourinary:  Negative  for difficulty urinating, dysuria, frequency and hematuria.   Musculoskeletal:  Negative for arthralgias, back pain and myalgias.  Skin:  Negative for rash.  Neurological:  Negative for dizziness and headaches.  Hematological:  Negative for adenopathy. Does not bruise/bleed easily.  Psychiatric/Behavioral:  Negative for depression and sleep disturbance. The patient is not nervous/anxious.      VITALS:  Blood pressure (!) 105/50, pulse 66, temperature 98.3 F (36.8 C), temperature source Oral, resp. rate 20, height _0  (1.6 m), weight 124 lb 1.6 oz (56.3 kg), SpO2 (!) 80 %.  Wt Readings from Last 3 Encounters:  12/11/21 124 lb 1.6 oz (56.3 kg)  11/26/21 122 lb (55.3 kg)  11/20/21 122 lb 12.8 oz (55.7 kg)    BP Readings from Last 3 Encounters:  12/11/21 (!) 105/50  11/26/21 (!) 130/56  11/20/21 (!) 128/58     Body mass index is 21.98 kg/m.  Performance status (ECOG): 3 - Symptomatic, >50% confined to bed  PHYSICAL EXAM:  Physical Exam Vitals and nursing note reviewed.  Constitutional:      General: [name] not in acute distress.    Appearance: Normal appearance. [name] underweight. Ill appearance: Chronically ill-appearing.  HENT:     Head: Normocephalic and atraumatic.     Mouth/Throat:     Mouth: Mucous membranes are moist.     Pharynx: Oropharynx is clear. No oropharyngeal exudate or posterior oropharyngeal erythema.  Eyes:     General: No scleral icterus.    Extraocular Movements: Extraocular movements intact.     Conjunctiva/sclera: Conjunctivae normal.     Pupils: Pupils are equal, round, and reactive to light.  Cardiovascular:     Rate and Rhythm: Normal rate and regular rhythm.     Heart sounds: Normal heart sounds. No murmur heard.    No friction rub. No gallop.  Pulmonary:     Effort: Pulmonary effort is normal.     Breath sounds: Normal breath sounds. No wheezing, rhonchi or rales.  Abdominal:     General: There is no distension.     Palpations: Abdomen  is soft. There is mass (Persistent large mass in the left abdomen). There is no hepatomegaly.     Tenderness: There is no abdominal tenderness.  Musculoskeletal:        General: Normal range of motion.     Cervical back: Normal range of motion and neck supple. No tenderness.     Right lower leg: No edema.     Left lower leg: No edema.  Lymphadenopathy:     Cervical: No cervical adenopathy.     Upper Body:     Right upper body: No supraclavicular or axillary adenopathy.     Left upper body: No supraclavicular or axillary adenopathy.     Lower Body: No right inguinal adenopathy. No left inguinal adenopathy.  Skin:    General: Skin is warm and dry.     Coloration: Skin is not jaundiced.     Findings: No rash.  Neurological:     Mental Status: [name] alert and oriented to person, place, and time.     Cranial Nerves: No cranial nerve deficit.  Psychiatric:        Mood and Affect: Mood normal.        Behavior: Behavior normal.        Thought Content: Thought content normal.    LABS:      Latest Ref Rng & Units 12/11/2021   12:00 AM 11/26/2021   12:00 AM 10/23/2021   12:00 AM  CBC  WBC  3.8     3.4     3.6      Hemoglobin 12.0 - 16.0 10.0     10.9     10.6      Hematocrit 36 - 46 32     34     34      Platelets 150 - 400 K/uL 148     125     271         This result is from an external source.       Latest Ref Rng & Units 12/11/2021   12:00 AM 11/26/2021   12:00 AM 10/23/2021   12:00 AM  CMP  BUN 4 - 21 39     28     34      Creatinine 0.5 - 1.1 2.0     1.6     1.6      Sodium  137 - 147 137     138     139      Potassium 3.5 - 5.1 mEq/L 4.7     3.7     4.5      Chloride 99 - 108 100     99     102      CO2 13 - _0 Calcium 8.7 - 10.7 8.8     8.6     8.4      Alkaline Phos 25 - 125 71     61     60      AST 13 - 35 50     17     19      ALT 7 - 35 U/L _1 This result is from an external source.      Lab Results  Component Value  Date   CEA1 2.4 03/10/2017   /  CEA  Date Value Ref Range Status  03/10/2017 2.4 0.0 - 4.7 ng/mL Final    Comment:    (NOTE)       [facility] ECLIA methodology       Nonsmokers  <3.9                                     Smokers     <5.6 Performed At: [facility] [place], [place] [id number] Lindon Romp MD [id number]    No results found for: "PSA1" No results found for: "CAN199" No results found for: "CAN125"  No results found for: "TOTALPROTELP", "ALBUMINELP", "A1GS", "A2GS", "BETS", "BETA2SER", "GAMS", "MSPIKE", "SPEI" Lab Results  Component Value Date   TIBC 294 08/24/2021   TIBC 301 03/10/2017   FERRITIN 132 08/24/2021   FERRITIN 132 03/10/2017   IRONPCTSAT 16 08/24/2021   IRONPCTSAT 7 (L) 03/10/2017   Lab Results  Component Value Date   LDH 106 01/14/2021    STUDIES:  No results found.   Bone Density- Last completed 05/23/2020. Results showed she is osteoporotic. Repeat in 2 years.    HISTORY:   Past Medical History:  Diagnosis Date   Anxiety    Benign neoplasm of rectum    Benign neoplasm of sigmoid colon    Chest pain in adult 12/27/2015   CKD (chronic kidney disease) stage 4, GFR 15-29 ml/min ([place]) 07/02/2019   CVA (cerebral vascular accident) ([place]) 03/08/2017   Dehydration 01/14/2021   Dyspepsia 03/09/2017   Dyspepsia 03/09/2017   Essential hypertension 11/20/2014   Fatigue 12/10/2016   High cholesterol    HLD (hyperlipidemia) 03/09/2017   Hx of transient ischemic attack (TIA) 10/18/2016   Hyperkalemia 10/13/2021   Hypertension    Hypertension, renal disease, stage 1-4 or unspecified chronic kidney disease 07/02/2019   Mixed hyperlipidemia 03/09/2017   Normocytic anemia    Occult blood positive stool 03/09/2017   Paroxysmal atrial fibrillation ([place]) 02/23/2017   Syncope 12/10/2016   TIA (transient ischemic attack)    Vascular dementia without behavioral disturbance ([place]) 07/02/2019    Past Surgical History:  Procedure  Laterality Date   ABDOMINAL HYSTERECTOMY     COLONOSCOPY N/A 03/11/2017   Procedure: COLONOSCOPY;  Surgeon: Gatha Mayer, MD;  Location: [place];  Service:  Endoscopy;  Laterality: N/A;   ESOPHAGOGASTRODUODENOSCOPY N/A 03/11/2017   Procedure: ESOPHAGOGASTRODUODENOSCOPY (EGD);  Surgeon: Gatha Mayer, MD;  Location: [facility] ENDOSCOPY;  Service: Endoscopy;  Laterality: N/A;   TONSILLECTOMY     VESICOVAGINAL FISTULA CLOSURE W/ TAH      Family History  Problem Relation Age of Onset   Cancer Father        lung   Stroke Mother    Cancer Brother        x2 bladder   Dementia Brother    Atrial fibrillation Son    Ataxia Neg Hx    Chorea Neg Hx    Mental retardation Neg Hx    Migraines Neg Hx    Multiple sclerosis Neg Hx    Neurofibromatosis Neg Hx    Neuropathy Neg Hx    Parkinsonism Neg Hx    Seizures Neg Hx     Social History:  reports that she has never smoked. She has never used smokeless tobacco. She reports that she does not drink alcohol and does not use drugs.The patient is accompanied by her son Sherry Hess today.  Allergies:  Allergies  Allergen Reactions   Minoxidil     rash   Norvasc [Amlodipine Besylate]     rash   Clonidine Derivatives Rash    [name]    Current Medications: Current Outpatient Medications  Medication Sig Dispense Refill   atorvastatin (LIPITOR) 20 MG tablet TAKE 1 TABLET BY MOUTH DAILY 90 tablet 0   Calcium Citrate (CAL-CITRATE PO) Take 600 mg by mouth daily.     carvedilol (COREG) 25 MG tablet TAKE 1 TABLET BY MOUTH 2 TIMES DAILY WITH A MEAL (Patient taking differently: Take 25 mg by mouth daily.) 180 tablet 1   diltiazem (CARDIZEM CD) 180 MG 24 hr capsule TAKE 1 CAPSULE BY MOUTH DAILY (Patient taking differently: Take 180 mg by mouth daily.) 90 capsule 3   donepezil (ARICEPT) 10 MG tablet TAKE 1 TABLET BY MOUTH DAILY AT BEDTIME (Patient taking differently: Take 20 mg by mouth. Taking 61m) 90 tablet 1   Ferrous Sulfate ([employer] SLOW  RELEASE IRON PO) Take 65 tablets by mouth in the morning and at bedtime. Taking 615m    Fish Oil-Cholecalciferol (FISH OIL + D3) 1000-1000 MG-UNIT CAPS Take 1 capsule by mouth 2 (two) times daily.     FLUoxetine (PROZAC) 10 MG capsule TAKE 1 CAPSULE BY MOUTH ONCE DAILY 90 capsule 1   furosemide (LASIX) 20 MG tablet Take 1 tablet (20 mg total) by mouth daily. (Patient taking differently: Take by mouth daily as needed.) 90 tablet 3   hydrALAZINE (APRESOLINE) 50 MG tablet TAKE 1 TABLET BY MOUTH 2 TIMES DAILY 180 tablet 1   levothyroxine (SYNTHROID) 25 MCG tablet TAKE 1 TABLET BY MOUTH ONCE DAILY ON AN EMPTY STOMACH 30 MINUTES BEFORE BREAKFAST 90 tablet 1   memantine (NAMENDA) 10 MG tablet TAKE 1 TABLET BY MOUTH 2 TIMES DAILY 180 tablet 1   Multiple Vitamin (MULTIVITAMIN) tablet Take 1 tablet by mouth daily. Unknown strength     omeprazole (PRILOSEC) 20 MG capsule TAKE 1 CAPSULE BY MOUTH DAILY (Patient taking differently: Take 20 mg by mouth daily.) 90 capsule 3   potassium chloride (KLOR-CON) 10 MEQ tablet Take 1 tablet (10 mEq total) by mouth 2 (two) times daily. 60 tablet 5   sodium chloride 1 g tablet Take 1 g by mouth daily.     No current facility-administered medications for this visit.  I,Gabriella Ballesteros,acting as a scribe for Derwood Kaplan, MD.,have documented all relevant documentation on the behalf of Derwood Kaplan, MD,as directed by  Derwood Kaplan, MD while in the presence of Derwood Kaplan, MD.

## 2022-01-14 ENCOUNTER — Other Ambulatory Visit: Payer: Self-pay | Admitting: Family Medicine

## 2022-01-14 ENCOUNTER — Ambulatory Visit: Payer: Medicare Other | Admitting: Oncology

## 2022-01-14 ENCOUNTER — Other Ambulatory Visit: Payer: Medicare Other

## 2022-01-15 DEATH — deceased

## 2022-03-02 ENCOUNTER — Telehealth: Payer: Medicare Other
